# Patient Record
Sex: Female | Born: 1935 | Race: White | Hispanic: No | Smoking: Never smoker
Health system: Southern US, Community
[De-identification: ages and names within clinical notes are randomized; demographics above are authoritative.]

## PROBLEM LIST (undated history)

## (undated) DIAGNOSIS — R0602 Shortness of breath: Secondary | ICD-10-CM

## (undated) DIAGNOSIS — I160 Hypertensive urgency: Secondary | ICD-10-CM

## (undated) DIAGNOSIS — R0789 Other chest pain: Secondary | ICD-10-CM

## (undated) DIAGNOSIS — R002 Palpitations: Secondary | ICD-10-CM

## (undated) DIAGNOSIS — F419 Anxiety disorder, unspecified: Secondary | ICD-10-CM

## (undated) DIAGNOSIS — I714 Abdominal aortic aneurysm, without rupture, unspecified: Secondary | ICD-10-CM

## (undated) DIAGNOSIS — K449 Diaphragmatic hernia without obstruction or gangrene: Secondary | ICD-10-CM

## (undated) DIAGNOSIS — K219 Gastro-esophageal reflux disease without esophagitis: Secondary | ICD-10-CM

## (undated) DIAGNOSIS — I739 Peripheral vascular disease, unspecified: Secondary | ICD-10-CM

## (undated) DIAGNOSIS — I10 Essential (primary) hypertension: Secondary | ICD-10-CM

## (undated) DIAGNOSIS — N289 Disorder of kidney and ureter, unspecified: Secondary | ICD-10-CM

## (undated) DIAGNOSIS — E785 Hyperlipidemia, unspecified: Secondary | ICD-10-CM

## (undated) DIAGNOSIS — R55 Syncope and collapse: Secondary | ICD-10-CM

## (undated) DIAGNOSIS — I509 Heart failure, unspecified: Secondary | ICD-10-CM

## (undated) DIAGNOSIS — M1990 Unspecified osteoarthritis, unspecified site: Secondary | ICD-10-CM

## (undated) HISTORY — PX: BACK SURGERY: SHX140

## (undated) HISTORY — DX: Anxiety disorder, unspecified: F41.9

## (undated) HISTORY — PX: SPINE SURGERY: SHX786

## (undated) HISTORY — PX: APPENDECTOMY: SHX54

## (undated) HISTORY — DX: Gastro-esophageal reflux disease without esophagitis: K21.9

## (undated) HISTORY — PX: ADENOIDECTOMY: SHX5191

## (undated) HISTORY — PX: TONSILLECTOMY: SHX5217

## (undated) HISTORY — DX: Essential (primary) hypertension: I10

## (undated) HISTORY — DX: Palpitations: R00.2

## (undated) HISTORY — PX: CHOLECYSTECTOMY: SHX55

## (undated) HISTORY — DX: Abdominal aortic aneurysm, without rupture: I71.4

## (undated) HISTORY — DX: Hypertensive urgency: I16.0

## (undated) HISTORY — DX: Shortness of breath: R06.02

## (undated) HISTORY — DX: Diaphragmatic hernia without obstruction or gangrene: K44.9

## (undated) HISTORY — DX: Disorder of kidney and ureter, unspecified: N28.9

## (undated) HISTORY — DX: Abdominal aortic aneurysm, without rupture, unspecified: I71.40

## (undated) HISTORY — PX: BLADDER SUSPENSION: SHX72

## (undated) HISTORY — PX: TOTAL HIP ARTHROPLASTY: SHX124

## (undated) HISTORY — DX: Syncope and collapse: R55

## (undated) HISTORY — PX: COLONOSCOPY W/ BIOPSIES AND POLYPECTOMY: SHX1376

## (undated) HISTORY — PX: ABDOMINAL HYSTERECTOMY: SHX81

## (undated) HISTORY — PX: TONSILLECTOMY: SUR1361

## (undated) HISTORY — DX: Heart failure, unspecified: I50.9

## (undated) HISTORY — DX: Hyperlipidemia, unspecified: E78.5

## (undated) HISTORY — PX: EYE SURGERY: SHX253

## (undated) HISTORY — DX: Unspecified osteoarthritis, unspecified site: M19.90

## (undated) HISTORY — DX: Other chest pain: R07.89

## (undated) HISTORY — DX: Peripheral vascular disease, unspecified: I73.9

---

## 1997-10-10 ENCOUNTER — Encounter: Admission: RE | Admit: 1997-10-10 | Discharge: 1998-01-08 | Payer: Self-pay | Admitting: Orthopedic Surgery

## 1998-04-07 ENCOUNTER — Inpatient Hospital Stay (HOSPITAL_COMMUNITY): Admission: EM | Admit: 1998-04-07 | Discharge: 1998-04-08 | Payer: Self-pay | Admitting: *Deleted

## 1998-04-07 ENCOUNTER — Encounter: Payer: Self-pay | Admitting: *Deleted

## 1998-10-01 ENCOUNTER — Encounter: Payer: Self-pay | Admitting: Neurosurgery

## 1998-10-01 ENCOUNTER — Ambulatory Visit (HOSPITAL_COMMUNITY): Admission: RE | Admit: 1998-10-01 | Discharge: 1998-10-01 | Payer: Self-pay | Admitting: Neurosurgery

## 1998-10-23 ENCOUNTER — Ambulatory Visit (HOSPITAL_COMMUNITY): Admission: RE | Admit: 1998-10-23 | Discharge: 1998-10-23 | Payer: Self-pay | Admitting: Neurosurgery

## 1998-10-23 ENCOUNTER — Encounter: Payer: Self-pay | Admitting: Neurosurgery

## 1998-11-06 ENCOUNTER — Encounter: Payer: Self-pay | Admitting: Neurosurgery

## 1998-11-06 ENCOUNTER — Ambulatory Visit (HOSPITAL_COMMUNITY): Admission: RE | Admit: 1998-11-06 | Discharge: 1998-11-06 | Payer: Self-pay | Admitting: Neurosurgery

## 1998-11-21 ENCOUNTER — Ambulatory Visit (HOSPITAL_COMMUNITY): Admission: RE | Admit: 1998-11-21 | Discharge: 1998-11-21 | Payer: Self-pay | Admitting: Neurosurgery

## 1998-11-21 ENCOUNTER — Encounter: Payer: Self-pay | Admitting: Neurosurgery

## 1999-03-02 ENCOUNTER — Ambulatory Visit (HOSPITAL_COMMUNITY): Admission: RE | Admit: 1999-03-02 | Discharge: 1999-03-02 | Payer: Self-pay | Admitting: Neurosurgery

## 1999-03-02 ENCOUNTER — Encounter: Payer: Self-pay | Admitting: Neurosurgery

## 1999-06-11 ENCOUNTER — Encounter: Payer: Self-pay | Admitting: Internal Medicine

## 1999-06-11 ENCOUNTER — Encounter: Admission: RE | Admit: 1999-06-11 | Discharge: 1999-06-11 | Payer: Self-pay | Admitting: Internal Medicine

## 1999-06-26 ENCOUNTER — Ambulatory Visit (HOSPITAL_COMMUNITY): Admission: RE | Admit: 1999-06-26 | Discharge: 1999-06-26 | Payer: Self-pay | Admitting: Neurosurgery

## 1999-06-26 ENCOUNTER — Encounter: Payer: Self-pay | Admitting: Neurosurgery

## 1999-07-16 ENCOUNTER — Encounter: Payer: Self-pay | Admitting: Neurosurgery

## 1999-07-18 ENCOUNTER — Inpatient Hospital Stay (HOSPITAL_COMMUNITY): Admission: RE | Admit: 1999-07-18 | Discharge: 1999-07-23 | Payer: Self-pay | Admitting: Neurosurgery

## 1999-07-18 ENCOUNTER — Encounter: Payer: Self-pay | Admitting: Neurosurgery

## 1999-07-19 ENCOUNTER — Encounter: Payer: Self-pay | Admitting: Neurosurgery

## 1999-07-22 ENCOUNTER — Encounter: Payer: Self-pay | Admitting: Neurosurgery

## 1999-09-15 ENCOUNTER — Encounter: Admission: RE | Admit: 1999-09-15 | Discharge: 1999-09-15 | Payer: Self-pay | Admitting: Neurosurgery

## 1999-09-15 ENCOUNTER — Encounter: Payer: Self-pay | Admitting: Neurosurgery

## 1999-11-11 ENCOUNTER — Encounter: Admission: RE | Admit: 1999-11-11 | Discharge: 1999-11-11 | Payer: Self-pay | Admitting: Neurosurgery

## 1999-11-11 ENCOUNTER — Encounter: Payer: Self-pay | Admitting: Neurosurgery

## 1999-11-23 ENCOUNTER — Encounter: Payer: Self-pay | Admitting: Neurosurgery

## 1999-11-23 ENCOUNTER — Ambulatory Visit (HOSPITAL_COMMUNITY): Admission: RE | Admit: 1999-11-23 | Discharge: 1999-11-23 | Payer: Self-pay | Admitting: Neurosurgery

## 2000-01-17 ENCOUNTER — Emergency Department (HOSPITAL_COMMUNITY): Admission: EM | Admit: 2000-01-17 | Discharge: 2000-01-17 | Payer: Self-pay | Admitting: Emergency Medicine

## 2000-01-17 ENCOUNTER — Encounter: Payer: Self-pay | Admitting: Emergency Medicine

## 2000-06-04 ENCOUNTER — Inpatient Hospital Stay (HOSPITAL_COMMUNITY): Admission: EM | Admit: 2000-06-04 | Discharge: 2000-06-08 | Payer: Self-pay | Admitting: Emergency Medicine

## 2000-06-04 ENCOUNTER — Encounter: Payer: Self-pay | Admitting: Emergency Medicine

## 2000-06-21 ENCOUNTER — Encounter: Payer: Self-pay | Admitting: Internal Medicine

## 2000-06-21 ENCOUNTER — Encounter: Admission: RE | Admit: 2000-06-21 | Discharge: 2000-06-21 | Payer: Self-pay | Admitting: Internal Medicine

## 2000-11-23 ENCOUNTER — Other Ambulatory Visit: Admission: RE | Admit: 2000-11-23 | Discharge: 2000-11-23 | Payer: Self-pay | Admitting: Obstetrics and Gynecology

## 2001-03-23 ENCOUNTER — Ambulatory Visit (HOSPITAL_COMMUNITY): Admission: RE | Admit: 2001-03-23 | Discharge: 2001-03-23 | Payer: Self-pay | Admitting: Neurosurgery

## 2001-03-23 ENCOUNTER — Encounter: Payer: Self-pay | Admitting: Neurosurgery

## 2001-06-13 ENCOUNTER — Encounter: Payer: Self-pay | Admitting: General Surgery

## 2001-06-13 ENCOUNTER — Encounter: Admission: RE | Admit: 2001-06-13 | Discharge: 2001-06-13 | Payer: Self-pay | Admitting: General Surgery

## 2001-10-05 ENCOUNTER — Encounter: Admission: RE | Admit: 2001-10-05 | Discharge: 2001-10-05 | Payer: Self-pay | Admitting: Neurosurgery

## 2001-10-05 ENCOUNTER — Encounter: Payer: Self-pay | Admitting: Neurosurgery

## 2001-10-25 ENCOUNTER — Encounter: Payer: Self-pay | Admitting: Neurosurgery

## 2001-10-25 ENCOUNTER — Encounter: Admission: RE | Admit: 2001-10-25 | Discharge: 2001-10-25 | Payer: Self-pay | Admitting: Neurosurgery

## 2001-11-08 ENCOUNTER — Encounter: Admission: RE | Admit: 2001-11-08 | Discharge: 2001-11-08 | Payer: Self-pay | Admitting: Neurosurgery

## 2001-11-08 ENCOUNTER — Encounter: Payer: Self-pay | Admitting: Neurosurgery

## 2002-04-28 ENCOUNTER — Ambulatory Visit (HOSPITAL_COMMUNITY): Admission: RE | Admit: 2002-04-28 | Discharge: 2002-04-28 | Payer: Self-pay | Admitting: Internal Medicine

## 2002-07-16 ENCOUNTER — Ambulatory Visit (HOSPITAL_COMMUNITY): Admission: RE | Admit: 2002-07-16 | Discharge: 2002-07-16 | Payer: Self-pay | Admitting: Neurosurgery

## 2002-07-16 ENCOUNTER — Encounter: Payer: Self-pay | Admitting: Neurosurgery

## 2002-08-09 ENCOUNTER — Encounter: Payer: Self-pay | Admitting: Neurosurgery

## 2002-08-11 ENCOUNTER — Inpatient Hospital Stay (HOSPITAL_COMMUNITY): Admission: RE | Admit: 2002-08-11 | Discharge: 2002-08-13 | Payer: Self-pay | Admitting: Neurosurgery

## 2002-08-11 ENCOUNTER — Encounter: Payer: Self-pay | Admitting: Neurosurgery

## 2002-09-29 ENCOUNTER — Encounter: Payer: Self-pay | Admitting: Internal Medicine

## 2002-09-29 ENCOUNTER — Encounter: Admission: RE | Admit: 2002-09-29 | Discharge: 2002-09-29 | Payer: Self-pay | Admitting: Internal Medicine

## 2003-01-12 ENCOUNTER — Ambulatory Visit (HOSPITAL_COMMUNITY): Admission: RE | Admit: 2003-01-12 | Discharge: 2003-01-12 | Payer: Self-pay | Admitting: Neurosurgery

## 2003-01-12 ENCOUNTER — Encounter: Payer: Self-pay | Admitting: Neurosurgery

## 2003-03-20 ENCOUNTER — Encounter: Admission: RE | Admit: 2003-03-20 | Discharge: 2003-03-20 | Payer: Self-pay | Admitting: Orthopedic Surgery

## 2003-03-21 ENCOUNTER — Ambulatory Visit (HOSPITAL_BASED_OUTPATIENT_CLINIC_OR_DEPARTMENT_OTHER): Admission: RE | Admit: 2003-03-21 | Discharge: 2003-03-21 | Payer: Self-pay | Admitting: Orthopedic Surgery

## 2003-03-21 ENCOUNTER — Ambulatory Visit (HOSPITAL_COMMUNITY): Admission: RE | Admit: 2003-03-21 | Discharge: 2003-03-21 | Payer: Self-pay | Admitting: Orthopedic Surgery

## 2003-06-29 ENCOUNTER — Ambulatory Visit (HOSPITAL_COMMUNITY): Admission: RE | Admit: 2003-06-29 | Discharge: 2003-06-29 | Payer: Self-pay | Admitting: Orthopedic Surgery

## 2003-06-29 ENCOUNTER — Ambulatory Visit (HOSPITAL_BASED_OUTPATIENT_CLINIC_OR_DEPARTMENT_OTHER): Admission: RE | Admit: 2003-06-29 | Discharge: 2003-06-29 | Payer: Self-pay | Admitting: Orthopedic Surgery

## 2003-09-17 ENCOUNTER — Ambulatory Visit (HOSPITAL_COMMUNITY): Admission: RE | Admit: 2003-09-17 | Discharge: 2003-09-17 | Payer: Self-pay | Admitting: Neurosurgery

## 2003-12-14 ENCOUNTER — Ambulatory Visit (HOSPITAL_BASED_OUTPATIENT_CLINIC_OR_DEPARTMENT_OTHER): Admission: RE | Admit: 2003-12-14 | Discharge: 2003-12-14 | Payer: Self-pay | Admitting: Orthopedic Surgery

## 2003-12-14 ENCOUNTER — Ambulatory Visit (HOSPITAL_COMMUNITY): Admission: RE | Admit: 2003-12-14 | Discharge: 2003-12-14 | Payer: Self-pay | Admitting: Orthopedic Surgery

## 2004-08-11 ENCOUNTER — Encounter: Admission: RE | Admit: 2004-08-11 | Discharge: 2004-08-11 | Payer: Self-pay | Admitting: Internal Medicine

## 2004-08-14 ENCOUNTER — Ambulatory Visit: Payer: Self-pay | Admitting: Internal Medicine

## 2004-08-19 ENCOUNTER — Ambulatory Visit (HOSPITAL_COMMUNITY): Admission: RE | Admit: 2004-08-19 | Discharge: 2004-08-19 | Payer: Self-pay | Admitting: Internal Medicine

## 2004-08-19 ENCOUNTER — Ambulatory Visit: Payer: Self-pay | Admitting: Internal Medicine

## 2004-09-21 ENCOUNTER — Ambulatory Visit (HOSPITAL_COMMUNITY): Admission: RE | Admit: 2004-09-21 | Discharge: 2004-09-21 | Payer: Self-pay | Admitting: Neurosurgery

## 2004-11-10 ENCOUNTER — Encounter: Admission: RE | Admit: 2004-11-10 | Discharge: 2004-11-10 | Payer: Self-pay | Admitting: Neurosurgery

## 2004-12-01 ENCOUNTER — Encounter: Admission: RE | Admit: 2004-12-01 | Discharge: 2004-12-01 | Payer: Self-pay | Admitting: Neurosurgery

## 2004-12-15 ENCOUNTER — Encounter: Admission: RE | Admit: 2004-12-15 | Discharge: 2004-12-15 | Payer: Self-pay | Admitting: Neurosurgery

## 2005-01-07 ENCOUNTER — Encounter: Admission: RE | Admit: 2005-01-07 | Discharge: 2005-01-07 | Payer: Self-pay | Admitting: Internal Medicine

## 2005-04-19 ENCOUNTER — Emergency Department (HOSPITAL_COMMUNITY): Admission: EM | Admit: 2005-04-19 | Discharge: 2005-04-19 | Payer: Self-pay | Admitting: Emergency Medicine

## 2005-04-20 ENCOUNTER — Ambulatory Visit (HOSPITAL_COMMUNITY): Admission: RE | Admit: 2005-04-20 | Discharge: 2005-04-20 | Payer: Self-pay | Admitting: Orthopedic Surgery

## 2005-07-21 ENCOUNTER — Emergency Department (HOSPITAL_COMMUNITY): Admission: EM | Admit: 2005-07-21 | Discharge: 2005-07-21 | Payer: Self-pay | Admitting: Emergency Medicine

## 2005-07-24 ENCOUNTER — Inpatient Hospital Stay (HOSPITAL_COMMUNITY): Admission: EM | Admit: 2005-07-24 | Discharge: 2005-07-26 | Payer: Self-pay | Admitting: Internal Medicine

## 2005-12-07 ENCOUNTER — Inpatient Hospital Stay (HOSPITAL_COMMUNITY): Admission: EM | Admit: 2005-12-07 | Discharge: 2005-12-09 | Payer: Self-pay | Admitting: Emergency Medicine

## 2006-01-09 HISTORY — PX: CARDIAC CATHETERIZATION: SHX172

## 2007-04-19 ENCOUNTER — Ambulatory Visit: Payer: Self-pay

## 2007-07-27 ENCOUNTER — Emergency Department (HOSPITAL_COMMUNITY): Admission: EM | Admit: 2007-07-27 | Discharge: 2007-07-27 | Payer: Self-pay | Admitting: Emergency Medicine

## 2007-12-13 ENCOUNTER — Ambulatory Visit: Payer: Self-pay | Admitting: Ophthalmology

## 2007-12-13 ENCOUNTER — Other Ambulatory Visit: Payer: Self-pay

## 2007-12-20 ENCOUNTER — Ambulatory Visit: Payer: Self-pay | Admitting: Ophthalmology

## 2008-01-10 ENCOUNTER — Ambulatory Visit: Payer: Self-pay | Admitting: *Deleted

## 2008-01-30 ENCOUNTER — Ambulatory Visit: Payer: Self-pay | Admitting: Ophthalmology

## 2008-02-18 ENCOUNTER — Emergency Department: Payer: Self-pay | Admitting: Emergency Medicine

## 2008-02-21 ENCOUNTER — Emergency Department (HOSPITAL_COMMUNITY): Admission: EM | Admit: 2008-02-21 | Discharge: 2008-02-21 | Payer: Self-pay | Admitting: Emergency Medicine

## 2008-04-19 HISTORY — PX: CARDIOVASCULAR STRESS TEST: SHX262

## 2008-10-12 ENCOUNTER — Inpatient Hospital Stay: Payer: Self-pay | Admitting: Internal Medicine

## 2008-10-12 HISTORY — PX: US ECHOCARDIOGRAPHY: HXRAD669

## 2008-10-18 ENCOUNTER — Emergency Department (HOSPITAL_COMMUNITY): Admission: EM | Admit: 2008-10-18 | Discharge: 2008-10-18 | Payer: Self-pay | Admitting: Emergency Medicine

## 2008-10-19 ENCOUNTER — Ambulatory Visit: Payer: Self-pay | Admitting: Vascular Surgery

## 2008-10-21 ENCOUNTER — Observation Stay (HOSPITAL_COMMUNITY): Admission: EM | Admit: 2008-10-21 | Discharge: 2008-10-24 | Payer: Self-pay | Admitting: Emergency Medicine

## 2009-02-21 ENCOUNTER — Ambulatory Visit (HOSPITAL_COMMUNITY): Admission: RE | Admit: 2009-02-21 | Discharge: 2009-02-21 | Payer: Self-pay | Admitting: Orthopedic Surgery

## 2009-08-23 ENCOUNTER — Encounter (INDEPENDENT_AMBULATORY_CARE_PROVIDER_SITE_OTHER): Payer: Self-pay

## 2009-11-16 ENCOUNTER — Emergency Department (HOSPITAL_COMMUNITY): Admission: EM | Admit: 2009-11-16 | Discharge: 2009-11-16 | Payer: Self-pay | Admitting: Emergency Medicine

## 2010-06-06 ENCOUNTER — Ambulatory Visit: Payer: Self-pay | Admitting: Cardiology

## 2010-06-10 NOTE — Letter (Signed)
Summary: Recall, Screening Colonoscopy Only  [facility]  [address]   [place], [place] [address]   Phone: [phone]  Fax: [phone]    August 23, 2009  SHAVONTA GOSSEN [address] [place], [place]  [address] 1927/11/07   Dear Ms. Soland,   Our records indicate it is time to schedule your colonoscopy.  However, our office has been unable to contact you by phone.   Please call our office at [phone] and ask for the nurse.   Thank you,    Hendricks Limes, LPN Cloria Spring, LPN  [facility] Ph: [phone]   Fax: [phone]

## 2010-06-12 ENCOUNTER — Telehealth (INDEPENDENT_AMBULATORY_CARE_PROVIDER_SITE_OTHER): Payer: Self-pay | Admitting: *Deleted

## 2010-06-13 ENCOUNTER — Ambulatory Visit (HOSPITAL_COMMUNITY): Payer: Medicare Other | Attending: Cardiology

## 2010-06-13 DIAGNOSIS — I079 Rheumatic tricuspid valve disease, unspecified: Secondary | ICD-10-CM | POA: Insufficient documentation

## 2010-06-13 DIAGNOSIS — I379 Nonrheumatic pulmonary valve disorder, unspecified: Secondary | ICD-10-CM | POA: Insufficient documentation

## 2010-06-13 DIAGNOSIS — I359 Nonrheumatic aortic valve disorder, unspecified: Secondary | ICD-10-CM

## 2010-06-13 DIAGNOSIS — I080 Rheumatic disorders of both mitral and aortic valves: Secondary | ICD-10-CM | POA: Insufficient documentation

## 2010-06-18 NOTE — Progress Notes (Signed)
Summary: Echo appt  Phone Note Outgoing Call Call back at [facility] Phone [phone]   Call placed by: Stanton Kidney, EMT-P,  June 12, 2010 3:02 PM Action Taken: Phone Call Completed Summary of Call: Patient aware to arrive 30 min prior to Echo appt. Stanton Kidney, EMT-P  June 12, 2010 3:02 PM

## 2010-07-15 ENCOUNTER — Emergency Department: Payer: Self-pay | Admitting: Emergency Medicine

## 2010-07-27 LAB — URINALYSIS, ROUTINE W REFLEX MICROSCOPIC
Hgb urine dipstick: NEGATIVE
Ketones, ur: NEGATIVE mg/dL
Nitrite: NEGATIVE
Protein, ur: NEGATIVE mg/dL
pH: 6.5 (ref 5.0–8.0)

## 2010-07-27 LAB — TROPONIN I: Troponin I: 0.01 ng/mL (ref 0.00–0.06)

## 2010-07-27 LAB — CK TOTAL AND CKMB (NOT AT ARMC)
CK, MB: 2.3 ng/mL (ref 0.3–4.0)
Relative Index: INVALID (ref 0.0–2.5)
Total CK: 79 U/L (ref 7–177)

## 2010-07-27 LAB — DIFFERENTIAL
Eosinophils Absolute: 0.2 10*3/uL (ref 0.0–0.7)
Eosinophils Relative: 3 % (ref 0–5)
Lymphs Abs: 1.6 10*3/uL (ref 0.7–4.0)
Monocytes Relative: 8 % (ref 3–12)

## 2010-07-27 LAB — POCT CARDIAC MARKERS: Troponin i, poc: <0.05 ng/mL (ref 0.00–0.09)

## 2010-07-27 LAB — URINE MICROSCOPIC-ADD ON

## 2010-07-27 LAB — BASIC METABOLIC PANEL
BUN: 24 mg/dL — ABNORMAL HIGH (ref 6–23)
Calcium: 9.2 mg/dL (ref 8.4–10.5)
Chloride: 110 mEq/L (ref 96–112)
Creatinine, Ser: 1.19 mg/dL (ref 0.4–1.2)
GFR calc non Af Amer: 44 mL/min — ABNORMAL LOW (ref >60)
Potassium: 4.2 mEq/L (ref 3.5–5.1)

## 2010-08-14 ENCOUNTER — Other Ambulatory Visit: Payer: Self-pay | Admitting: Neurosurgery

## 2010-08-14 DIAGNOSIS — M479 Spondylosis, unspecified: Secondary | ICD-10-CM

## 2010-08-16 ENCOUNTER — Ambulatory Visit
Admission: RE | Admit: 2010-08-16 | Discharge: 2010-08-16 | Disposition: A | Discharge status: Home / Self Care | Payer: Medicare Other | Source: Ambulatory Visit | Attending: Neurosurgery | Admitting: Neurosurgery

## 2010-08-16 DIAGNOSIS — M479 Spondylosis, unspecified: Secondary | ICD-10-CM

## 2010-08-18 LAB — APTT: aPTT: 28 seconds (ref 24–37)

## 2010-08-18 LAB — URINALYSIS, ROUTINE W REFLEX MICROSCOPIC
Glucose, UA: NEGATIVE mg/dL
Glucose, UA: NEGATIVE mg/dL
Hgb urine dipstick: NEGATIVE
Hgb urine dipstick: NEGATIVE
Protein, ur: NEGATIVE mg/dL
Specific Gravity, Urine: 1.007 (ref 1.005–1.030)
Specific Gravity, Urine: 1.014 (ref 1.005–1.030)
Urobilinogen, UA: 1 mg/dL (ref 0.0–1.0)
pH: 6 (ref 5.0–8.0)

## 2010-08-18 LAB — DIFFERENTIAL
Basophils Absolute: 0 10*3/uL (ref 0.0–0.1)
Basophils Relative: 0 % (ref 0–1)
Eosinophils Absolute: 0.2 10*3/uL (ref 0.0–0.7)
Eosinophils Absolute: 0.2 10*3/uL (ref 0.0–0.7)
Lymphocytes Relative: 31 % (ref 12–46)
Lymphs Abs: 1.8 10*3/uL (ref 0.7–4.0)
Monocytes Relative: 14 % — ABNORMAL HIGH (ref 3–12)
Monocytes Relative: 17 % — ABNORMAL HIGH (ref 3–12)
Neutro Abs: 3.3 10*3/uL (ref 1.7–7.7)
Neutrophils Relative %: 46 % (ref 43–77)
Neutrophils Relative %: 49 % (ref 43–77)

## 2010-08-18 LAB — CBC
HCT: 36.2 % (ref 36.0–46.0)
HCT: 39.5 % (ref 36.0–46.0)
Hemoglobin: 12.1 g/dL (ref 12.0–15.0)
Hemoglobin: 12.9 g/dL (ref 12.0–15.0)
Hemoglobin: 13.1 g/dL (ref 12.0–15.0)
MCHC: 33.5 g/dL (ref 30.0–36.0)
MCV: 92.7 fL (ref 78.0–100.0)
MCV: 93.2 fL (ref 78.0–100.0)
Platelets: 274 10*3/uL (ref 150–400)
Platelets: 315 10*3/uL (ref 150–400)
RBC: 3.85 MIL/uL — ABNORMAL LOW (ref 3.87–5.11)
RBC: 4.02 MIL/uL (ref 3.87–5.11)
RBC: 4.15 MIL/uL (ref 3.87–5.11)
RBC: 4.29 MIL/uL (ref 3.87–5.11)
RBC: 4.36 MIL/uL (ref 3.87–5.11)
RDW: 13.8 % (ref 11.5–15.5)
RDW: 14.2 % (ref 11.5–15.5)
WBC: 6.3 10*3/uL (ref 4.0–10.5)
WBC: 5.9 10*3/uL (ref 4.0–10.5)
WBC: 6 10*3/uL (ref 4.0–10.5)
WBC: 7.1 10*3/uL (ref 4.0–10.5)

## 2010-08-18 LAB — METANEPHRINES, URINE, 24 HOUR: Normetanephrine, 24H Ur: 397 mcg/24 h (ref 122–676)

## 2010-08-18 LAB — COMPREHENSIVE METABOLIC PANEL
ALT: 17 U/L (ref 0–35)
ALT: 18 U/L (ref 0–35)
ALT: 20 U/L (ref 0–35)
AST: 22 U/L (ref 0–37)
Albumin: 3 g/dL — ABNORMAL LOW (ref 3.5–5.2)
Alkaline Phosphatase: 46 U/L (ref 39–117)
BUN: 20 mg/dL (ref 6–23)
CO2: 26 mEq/L (ref 19–32)
CO2: 27 mEq/L (ref 19–32)
Calcium: 9.4 mg/dL (ref 8.4–10.5)
Chloride: 107 mEq/L (ref 96–112)
Chloride: 108 mEq/L (ref 96–112)
Creatinine, Ser: 1.38 mg/dL — ABNORMAL HIGH (ref 0.4–1.2)
Creatinine, Ser: 1.4 mg/dL — ABNORMAL HIGH (ref 0.4–1.2)
GFR calc Af Amer: 44 mL/min — ABNORMAL LOW (ref >60)
GFR calc Af Amer: 45 mL/min — ABNORMAL LOW (ref >60)
GFR calc non Af Amer: 36 mL/min — ABNORMAL LOW (ref >60)
GFR calc non Af Amer: 37 mL/min — ABNORMAL LOW (ref >60)
Glucose, Bld: 104 mg/dL — ABNORMAL HIGH (ref 70–99)
Glucose, Bld: 94 mg/dL (ref 70–99)
Potassium: 3.6 mEq/L (ref 3.5–5.1)
Sodium: 139 mEq/L (ref 135–145)
Sodium: 140 mEq/L (ref 135–145)
Sodium: 144 mEq/L (ref 135–145)
Total Bilirubin: 0.6 mg/dL (ref 0.3–1.2)
Total Bilirubin: 0.6 mg/dL (ref 0.3–1.2)
Total Protein: 6.2 g/dL (ref 6.0–8.3)

## 2010-08-18 LAB — POCT CARDIAC MARKERS: Troponin i, poc: <0.05 ng/mL (ref 0.00–0.09)

## 2010-08-18 LAB — CK TOTAL AND CKMB (NOT AT ARMC)
CK, MB: 0.8 ng/mL (ref 0.3–4.0)
CK, MB: 0.9 ng/mL (ref 0.3–4.0)
CK, MB: 1 ng/mL (ref 0.3–4.0)
Relative Index: INVALID (ref 0.0–2.5)
Relative Index: INVALID (ref 0.0–2.5)
Relative Index: INVALID (ref 0.0–2.5)
Total CK: 48 U/L (ref 7–177)
Total CK: 52 U/L (ref 7–177)

## 2010-08-18 LAB — BRAIN NATRIURETIC PEPTIDE: Pro B Natriuretic peptide (BNP): 66 pg/mL (ref 0.0–100.0)

## 2010-08-18 LAB — VMA + CREATININE, URINE (TIMED COLLECTION)
VMA, 24H Ur Adult: 5.5 mg/24hr (ref 1.8–6.7)
Volume, Urine-VMAUR: 1150

## 2010-08-18 LAB — URINE CULTURE: Colony Count: NO GROWTH

## 2010-08-18 LAB — CATECHOLAMINES, FRACTIONATED, PLASMA
Dopamine: 62 pg/mL — ABNORMAL HIGH (ref <60)
Total Catecholamines (Nor+Epi): 972 pg/mL — ABNORMAL HIGH (ref <504)

## 2010-08-18 LAB — URINE MICROSCOPIC-ADD ON

## 2010-08-18 LAB — TROPONIN I
Troponin I: 0.01 ng/mL (ref 0.00–0.06)
Troponin I: 0.02 ng/mL (ref 0.00–0.06)
Troponin I: 0.03 ng/mL (ref 0.00–0.06)

## 2010-08-18 LAB — PROTIME-INR: INR: 0.9 (ref 0.00–1.49)

## 2010-08-18 LAB — MICROALBUMIN, URINE, 24 HOUR: Urine Total Volume-MALBU: 1150 mL

## 2010-08-18 LAB — D-DIMER, QUANTITATIVE: D-Dimer, Quant: 0.74 ug/mL-FEU — ABNORMAL HIGH (ref 0.00–0.48)

## 2010-09-09 ENCOUNTER — Ambulatory Visit: Payer: Self-pay | Admitting: Physical Medicine and Rehabilitation

## 2010-09-23 NOTE — H&P (Signed)
NAME:  Amy Bolton, Amy Bolton                ACCOUNT NO.:  [id number]   MEDICAL RECORD NO.:  [id number]          PATIENT TYPE:  INP   LOCATION:  3731                         FACILITY:  [facility]   PHYSICIAN:  Barry Dienes. Eloise Harman, M.D.DATE OF BIRTH:  1927-12-29   DATE OF ADMISSION:  10/21/2008  DATE OF DISCHARGE:                              HISTORY & PHYSICAL   CHIEF COMPLAINT:  Chest pain and palpitations.   HISTORY OF PRESENT ILLNESS:  The patient is a 77 year old white female,  who was recently noted to have markedly increased blood pressure, which  resulted in a 4-day admission to the [facility].  During  that stay, an urinary tract infection was diagnosed and treated and an  echocardiogram was done with results not currently available.  Benicar  was also changed to Toprol-XL 100 mg p.o. b.i.d.  On October 18, 2008, she  presented with vague substernal chest pain and palpitations to the  emergency room.  At that time, an EKG, chest x-ray, and labs were all  unremarkable with the exception of a D-dimer level of 0.74.  A dose of  Lovenox was given and she presented to our office the next day for  followup.  On October 19, 2008, she had bilateral DVT ultrasound exam done  that was normal, so she was not continued on anticoagulation therapy.  A  CT angiogram was not done due to borderline serum creatinine levels.  Today, starting at around 2:00 p.m., she had palpitations associated  with vague chest pressure and moderate dyspnea on exertion.  This lasted  several hours and resolved by the time of emergency room presentation.  Currently, she feels that are baseline.  She notes over the past 2  weeks, she has had a great deal of fatigue.  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   PAST MEDICAL HISTORY:  Hypertension, asthma, bronchitis, depression,  gastroesophageal reflux disease, and hiatal hernia, hyperlipidemia, and  osteoarthritis.  In 2006 colonoscopy showing polyps and diverticuli and  in September 2007 cardiac catheterization showing minimal proximal left  anterior descending artery disease.   MEDICATIONS:  1. Advair 100/50 one inhalation twice daily.  2. Hydralazine 50 mg p.o. b.i.d.  3. Lofibra 160 mg p.o. daily.  4. Toprol-XL 100 mg p.o. b.i.d.  5. Xanax 0.25 mg p.o. nightly.  6. Fish oil 1 g daily.  7. Remeron 30 mg p.o. nightly.   ALLERGIES:  PENICILLIN has been associated with C. difficile colitis.  She recalls no history of allergy to Norvasc.   PAST SURGICAL HISTORY:  Tonsillectomy and adenoidectomy, total abdominal  hysterectomy, cholecystectomy, in 2001 lumbar spine surgery at the L4-5  level; in 2004, C-spine operation with fusion at the C5-6 level; in  2004, left thumb CMC joint operation; in 2005 bilateral rotator cuff  surgeries, and bladder suspension surgery; in September 2007, cardiac  catheterization showing less than 50% stenosis of the proximal LAD and  otherwise normal coronary arteries.  At that time, a CT angiogram a  chest was normal.   FAMILY  HISTORY:  COPD, Alzheimer disease, colon cancer in a sister and  diabetes mellitus, type 2 in 2 sisters.   SOCIAL HISTORY:  She has been a widow since September 2007.  She is  retired.  She has 1 son and 2 daughters.  A daughter lives approximately  4 miles away from her home.  She has no history of tobacco abuse or  alcohol abuse and lives by herself.   REVIEW OF SYSTEMS:  She has intermittent wheezing.  She also had  intermittent dyspnea on exertion of class 3/4 and intermittent  palpitations and vague substernal chest pain.  She has mild bilateral  knee pain.  She has not had recent fever, chills, low back pain,  anxiety, or depression.   INITIAL PHYSICAL EXAMINATION:  VITAL SIGNS:  Blood pressure 164/65,  pulse 71, respirations 20, temperature 97.8, pulse oxygen saturation was  97% on room air.  GENERAL:  She is an  overweight white female who is in  no apparent distress, while lying partially upright in bed.  HEAD, EYES, EARS, NOSE and THROAT:  Within normal limits.  NECK:  Supple without jugular venous distention or carotid bruit.  CHEST:  Clear to auscultation.  HEART:  Regular rate and rhythm with a systolic ejection murmur, grade  2/6 at the left sternal border suggestive of aortic stenosis.  ABDOMEN:  Normal bowel sounds and no hepatosplenomegaly or renal bruit.  EXTREMITIES:  Without cyanosis, clubbing, or edema and the pedal pulses  were normal bilaterally.  NEUROLOGIC:  She was alert and well oriented with normal affect.  She  had a nonfocal neurologic exam.   LABORATORY STUDIES:  PTT 28, PT 12.6, white blood cell count 7.1,  hemoglobin 13, hematocrit 39, and platelets 3-8.  Troponin I level was  less than 0.05 and 0.03, CK-MB was less than 1.0.  On October 18, 2008,  serum sodium was 139 with potassium 3.9, chloride 108, carbon dioxide  24, BUN 24, creatinine 1.40, and glucose 104.  Total protein is 6.2 and  albumin is 3.6.   EKG showed the following:  1. Normal sinus rhythm.  2. Early R/S transition in the precordial leads.   A chest x-ray showed mild hyperexpansion of the lungs with no acute  cardiopulmonary disease.   IMPRESSION AND PLAN:  1. Chest pain and palpitations:  Her symptoms are intermittent and      suggestive of intermittent atrial fibrillation.  I doubt that she      has new angina.  Gastroesophageal reflux disease causing substernal      chest discomfort is possible, but would not likely causes shortness      of breath.  I plan to check serial cardiac isoenzymes and keep her      on a telemetry bed.  We will have her cardiologist reevaluate her      in the a.m.  We will try to obtain the official echocardiogram      report from her recent [facility] stay.  She will      be placed on      empiric Protonix 40 mg daily as her symptoms could be due to       reflux.  2. Fatigue:  Most likely due to high-dose Toprol-XL treatment.  I      would defer a decision on lowering her beta-blocker treatment and      restarting her ARB to her primary care physician.  ______________________________  Barry Dienes Eloise Harman, M.D.     DGP/MEDQ  D:  10/21/2008  T:  10/22/2008  Job:  [id number]   cc:   [name], MD  Colleen Can Deborah Chalk, M.D.  Harvie Junior, M.D.  Payton Doughty, M.D.

## 2010-09-23 NOTE — Procedures (Signed)
DUPLEX DEEP VENOUS EXAM - LOWER EXTREMITY   INDICATION:  Right lower extremity pain and tightness.   HISTORY:  Edema:  No.  Trauma/Surgery:  Yes.  Pain:  Yes.  PE:  No.  Previous DVT:  No.  Anticoagulants:  Yes.  Other:   DUPLEX EXAM:                CFV   SFV   PopV  PTV    GSV                R  L  R  L  R  L  R   L  R  L  Thrombosis    o  o  o     o     o      o  Spontaneous   +  +  +     +     +      +  Phasic        +  +  +     +     +      +  Augmentation  +  +  +     +     +      +  Compressible  +  +  +     +     +      +  Competent     +  +  +     +     +      +   Legend:  + - yes  o - no  p - partial  D - decreased   IMPRESSION:  No evidence of deep or superficial vein thrombosis in the  right lower extremity.    _____________________________  Quita Skye Hart [place], M.D.   AC/MEDQ  D:  10/19/2008  T:  10/19/2008  Job:  [id number]

## 2010-09-23 NOTE — Discharge Summary (Signed)
NAME:  Amy Bolton, Amy Bolton NO.:  [id number]   MEDICAL RECORD NO.:  [id number]          PATIENT TYPE:  OBV   LOCATION:  3738                         FACILITY:  [facility]   PHYSICIAN:  [name], MD       DATE OF BIRTH:  17-May-1927   DATE OF ADMISSION:  10/21/2008  DATE OF DISCHARGE:  10/24/2008                               DISCHARGE SUMMARY   PRIMARY CARE Abdalla Naramore:  [name], M.D.   CARDIOLOGIST:  Dr. Deborah Chalk and Dr. Reyes Ivan.   ORTHOPEDIC SURGEON:  Dr. Luiz Blare.   NEPHROLOGIST:  Dr. Lennox Pippins in [place], [place].   DISCHARGE DIAGNOSES:  1. Atypical chest pain.  2. Significant palpitations.  3. Episodic shortness of breath.  4. Episodic hypertensive urgency.  5. Hypertension.  6. Underlying asthma  7. Chronic kidney disease, stage 3, with creatinine clearance      estimated to be about 40.  8. Chronic anxiety.  9. Constipation.  10.Gastroesophageal reflux disease.  11.History of hiatal hernia.  12.Hyperlipidemia.  13.Osteoarthritis.  14.History of cardiac catheterization in September 2007, showing      minimal proximal left anterior descending artery disease.  15.Outpatient lower extremity Doppler, negative for deep vein      thrombosis on June 11.  16.Stress Cardiolite done April 19, 2008 showing ejection fraction      69% with otherwise a normal stress nuclear study.  17.2-D echo done at [facility] on October 12, 2008      showed some diastolic dysfunction, ejection fraction 55%, mild      tricuspid regurgitation, right ventricular systolic pressure      elevated a 40-50 mmHg, mild valvular aortic stenosis noted.   DISCHARGE PROCEDURES:  1. Include medical management.  2. Chest x-ray dated, October 21, 2008, no acute cardiopulmonary process.  3. VQ scan dated on October 22, 2008 shows normal ventilation-perfusion      lung scan.  No evidence suggestive of pulmonary emboli.  4. Cardiology consultation.   HISTORY OF  PRESENT ILLNESS:  Briefly, [name] is well-known to  me and has had a rough 2 or 3 weeks.  Of note, due to insurance reasons  and cost, her angiotensin receptor blocker was changed.  Immediately  after that change she was having worse and worse issues with her plantar  fasciitis and underwent injection, followed by oral steroids.  The  combination of this seemed to precipitate a hypertensive urgency event  for which she was admitted to [facility].  During that  time she had a urinary tract infection and acute renal failure on her  mild chronic renal insufficiency.  She was seen by nephrology.  Echocardiogram was done and medications were adjusted.  She followed up  with me as an outpatient and was doing well.  Blood pressure was okay.  The next day she had an acute episode of shortness of breath and  congestion, was sent to the emergency department where she was seen and  evaluated and was told she had an upper respiratory infection and sent  home.  At the June 10 visit, EKG, chest x-ray and labs were all  unremarkable except she did have a D-dimer that was elevated.  She was  given a dose of Lovenox and was sent home.  I saw her in the office the  following day and, as I stated before, she was still doing okay, but I  did send her for a lower extremity Doppler which was negative for DVT.  CT angiogram was not done due to her underlying creatinine elevation.  On October 21, 2008 around 2:00 p.m. she started developing palpitations,  associated with vague chest pressure and moderate dyspnea on exertion  and she called the doctor on call and was sent back to the emergency  department.  Blood pressure was 164/65, pulse 71, temperature was  normal, satting 97% on room air and physical exam was unremarkable  except for a 1-2/6 systolic ejection murmur noted.  White count was 7.1,  hemoglobin 13, platelet count was normal.  CK and troponins were normal.  BUN was 24,  creatinine was 1.4, glucose was 104, rest of labs were  normal.  EKG shows normal sinus rhythm.  Chest x-ray showed no acute  cardiopulmonary disease.  She was admitted for further evaluation and  treatment.   HOSPITAL COURSE:  I followed up with her the following morning on October 22, 2008.  Dr. Eloise Harman had put her on heparin due to the chest pain and  shortness of breath.  She ruled out for MI and, since she had episodic  shortness of breath, I sent her down for a VQ scan that was negative.  After VQ scan was negative and the labs remained normal, the heparin was  converted over to subcutaneous heparin for just DVT prophylaxis.  For  the chest pain, shortness of breath, palpitations, she remained on  telemetry monitoring and did not have any underlying arrhythmias, except  for sinus tachycardia, PVCs and PACs.  With my help and Dr. Silva Bandy  help, we titrated her medications to better effect.  It was felt that  she did not need any further cardiac workup.  Her creatinine remained  stable.  Due to the episodic nature of her symptoms, I felt pushing up  her beta blocker to slow the heart rate down, tight blood pressure  control was necessary, as did Dr. Reyes Ivan.  We ended up changing the  Toprol to Coreg to better beta blockade without pulmonary manifestations  and felt that, if we needed to get further slowing of the heart rate we  could always add digoxin.  For her atypical chest pain, she had a stress  test in the last 6 months and no further workup was felt necessary.  For  episodic blood pressure and heart rate adjustment, I sent a VMA,  metanephrines and catecholamines to rule out pheo.  I know this is a  stretch, but feel that, with her recent issues, this would be a  worthwhile endeavor.   For her episodic shortness of breath, a VQ scan was negative.  There was  no PE.  Her lower extremity Dopplers were negative.  There is no further  testing that needs to be done.  She is not  wheezing.  She is not having  any lung issues.  She is not having any underlying asthma symptoms.  She  will continue on her Advair and use albuterol on a very rare and p.r.n.  basis.   For hypertension, her blood pressure on date of discharge  on October 24, 2008 was 124/63, heart rate was 80.  Monitor showed 78-80 with PACs.   On October 24, 2008, she wants to go home.  She had partial  colon/constipation relief with a suppository that she had yesterday.  Her blood pressure is perfect.  She was having no more palpitations.  She was walking up in the halls without issue.  She is not having any  more chest pain or shortness of breath or tightness.  I will follow up  on the VMA, metanephrines and catecholamines as an outpatient and her  kidney function remained stable.  She said she  will go home and, if she continues to be constipated, she will take an  enema and do whatever else she needs to do.  Her last BUN and creatinine  were 21 and 1.38, respectively.  The patient will follow up with myself  and her nephrologist as an outpatient over the next couple of weeks and  will call earlier should I need to see her sooner.      [name], MD  Electronically Signed     JMR/MEDQ  D:  10/24/2008  T:  10/24/2008  Job:  [id number]   cc:   Colleen Can. Deborah Chalk, M.D.  Elmore Guise., M.D.  Harvie Junior, M.D.  [place], [place] Dr Rexene Edison Lizabeth Leyden

## 2010-09-26 NOTE — Discharge Summary (Signed)
[place]  Patient:    Amy Bolton, Amy Bolton                       MRN: [id number] Adm. Date:  [id number] Disc. Date: [id number] Attending:  Berry, Jonathan [place] Dictator:   Mancel Bale, P.A. CC:         Dr. Lorretta Harp, M.D.   Discharge Summary  ADMITTING DIAGNOSES:  1. Chest pain, questionable etiology (noncardiac).  2. Coronary artery disease; minimal coronary artery disease at     catheterization in 1999.  3. Hiatal hernia.  4. Gastroesophageal reflux disease.  5. Hypertension.  6. Status post lumbar surgery.  7. Status post cervical disk disease.  8. Hyperlipidemia.  9. History of rectocele.  DISCHARGE DIAGNOSES:  1. Chest pain, questionable etiology (noncardiac).  2. Coronary artery disease, minimal coronary artery disease at     catheterization in 1999.  3. Hiatal hernia.  4. Gastroesophageal reflux disease.  5. Hypertension.  6. Status post lumbar surgery.  7. Status post cervical disk disease.  8. Hyperlipidemia.  9. History of rectocele. 10. Status post cardiac catheterization June 07, 2000 revealing normal     left ventricular function and minimal coronary artery disease.  HISTORY OF PRESENT ILLNESS:   Amy Bolton is a 77 year old white female with a history of nonobstructive CAD by cath in 1999, which revealed a 20-30% ostial smooth LAD with normal left main, left circumflex and RCA.  She had done well until the morning of June 04, 2000.  At that time after having her permanent by her beautician she developed chest tightness, near syncope and nausea.  She laid down and took a nitroglycerin with relief.  After she got up the chest tightness returned and she had to take a second nitroglycerin with relief.  She then came to the ER.  In the ER she received Pepcid and GI cocktail, and was currently pain-free.  She does relate she has had increased indigestion with anything recently.  She had just started  taking Prevacid the previous day.  She also has a recent history of rapid heart rate that she calls palpitations.  She had no dizziness with the rapid heart rate, but was aware of the heart rate at that time.  She just had her Holter monitor removed yesterday.  She does report that after the Holter monitor was removed, while she was driving home, she suddenly did not know where she was and had to stop and think about it for a minute prior to realizing where she was.  On exam at that time she appeared in no acute distress.  Her blood pressure is 167/84, heart rate 83 and oxygen saturation 99% on 2 liters.  First set of cardiac enzymes were negative and other labs are stable.  EKG shows sinus rhythm with no acute change.  At that time she was planned for admission to telemetry.  We will check serial enzymes to rule out MI. She was placed on Lovenox and nitroglycerin paste.  As well we will treat her with Nexium b.i.d.  We will plan for cardiac catheterization on Monday.  HOSPITAL COURSE:  On June 06, 2000 Amy Bolton remained stable with no complaints.  Her enzymes continued to be negative and she was awaiting her cardiac catheterization.  On June 07, 2000 Amy Bolton underwent cardiac catheterization by Dr. Nanetta Batty.  She was found to have normal LV function.  She was found  to have a proximal 40-50% LAD lesion, otherwise her left main, left circumflex and RCA were with no coronary disease.  She was felt to have noncardiac chest pain and will be treated proton pump inhibitor, and she was planned for discharge home later that afternoon if stable.  However, that afternoon at 4 P.M. she was hypertensive with a blood pressure of 178/75.  This was treated with Lopressor 5 mg IV times one.  Her blood pressure has been down to 148/64.  We plan to increase the Norvasc to 10 mg a day for outpatient control as her blood pressure was also somewhat elevated at admission at which time it  was 167/84.  On June 08, 2000 Amy Bolton was without complaints.  She was afebrile at 98.3, pulse 75 and blood pressure was controlled 135/55.  Her exam was benign and her right groin had no ecchymosis, no hematoma and no bruit.  Her blood pressure was now well controlled and she was stable post cardiac catheterization.  She was felt to be stable for discharge home at this time.  HOSPITAL CONSULTS:  None.  HOSPITAL PROCEDURES: 1. Cardiac catheterization by Dr. Nanetta Batty on June 07, 2000.    This revealed the following:    a) Left main normal.    b) LAD - smooth segmental proximal 40-50% stenosis.    c) Left circumflex free of significant disease.    d) RCA - large dominant vessel, free of disease. 2. Left ventriculography showed EF 60%.  LABORATORY DATA:  TSH normal at 1.370.  Cardiac enzymes negative with CKs of 70, 56 and 45.  MB 1.2, 1.2 and 1.3.  Troponin 0.01, 0.01 and 0.01.  Lipid profile showed total cholesterol 180, triglyceride 91, HDL 41 and LDL 121.  On admission PT 12.0, INR 0.9 and PTT 31.  Also on admission metabolic profile showed sodium 137, potassium 3.8, glucose 88, BUN 35, and creatinine 1.4. LFTs revealed AST 116, ALT 77, _____  118 and total bili 0.6.  CBC on admission was WBC 10.6, hemoglobin 13.7, hematocrit 41.1 and platelets 306,000.  All laboratories remained within stable range throughout the hospitalization.  At discharge:  WBC 10.7, hemoglobin 14.0, hematocrit 41.5 and platelets 369,000.  Occult blood was negative.  As well post cardiac catheterization metabolic profile showed sodium 138, potassium 4.1, BUN 28 and creatinine 1.3.  Again, occult blood was negative.  Radiology:  Chest x-ray June 04, 2000 showed no acute pulmonary disease.  EKG showed normal sinus rhythm with nonspecific ST-T changes; no acute change.  DISCHARGE MEDICATIONS: 1. Norvasc 10 mg once a day.  2. Atacand 32 mg once a day. 3. Zocor 40 mg each night. 4.  Calcium as before. 5. Protonix 40 mg once a day.  DISCHARGE INSTRUCTIONS:  No strenuous activities, lifting greater than 5 pounds or driving for three days.  DIET:  Low-salt, low-fat, low-cholesterol diet.  _______________ with warm water and soap.  Call the office at [phone] if any bleeding or increased size or pain in the groin.  FOLLOW-UP:  In the [facility] office with Dr. Alanda Amass and Meriam Sprague on February 14 at 9:45 A.M. DD:  06/29/00 TD:  06/30/00 Job: [id number]

## 2010-09-26 NOTE — Op Note (Signed)
NAME:  Amy Bolton, Amy Bolton                ACCOUNT NO.:  [id number]   MEDICAL RECORD NO.:  [id number]          PATIENT TYPE:  AMB   LOCATION:  DAY                           FACILITY:  [facility]   PHYSICIAN:  Lionel December, M.D.    DATE OF BIRTH:  06/24/1927   DATE OF PROCEDURE:  08/19/2004  DATE OF DISCHARGE:                                 OPERATIVE REPORT   PROCEDURE:  Colonoscopy.   INDICATIONS:  Dewayne Hatch is a 77 year old Caucasian female with a recent change in  her bowel habits. She has developed constipation. She passes a small amount  of stool three or four times a day. However, she has not had any bleeding.  Family history is positive for colorectal carcinoma in a sister at age 46  who is undergoing chemo. Procedure and risks were reviewed with the patient,  and informed consent was obtained.   PREMEDICATION:  Demerol 50 mg IV, Versed 6 mg IV in divided dose.   Please note the patient was given SBE prophylaxis given heart murmur. 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   FINDINGS:  Procedure performed in endoscopy suite. The patient's vital signs  and O2 saturation were monitored during the procedure and remained stable.  The patient was placed in left lateral position and rectal examination  performed. No abnormality noted on external or digital exam. Olympus  videoscope was placed in rectum and advanced under vision into sigmoid  colon. She had some pigmentation consistent with melanosis coli. Scattered  diverticula noted at sigmoid colon. Scope was carefully advanced into  descending colon. There was 5-mm polyp which was easily ablated via cold  biopsy. Her preparation was satisfactory. Scope was advanced to cecum which  was identified by ileocecal valve. Blunt end of cecum was well seen.  Pictures taken for the record. Please note that the appendiceal orifice was  not seen. She had a scar in its place  indicating prior appendectomy. As the  scope was withdrawn, colonic mucosa was carefully examined. There was a 4 to  5 mm polyp at descending colon which was ablated completely via cold biopsy.  Smaller polyp was noted at sigmoid colon and was ablated in similar fashion.  Rectal mucosa was normal. Scope was retroflexed to examine anorectal  junction. Moderate size hemorrhoids were noted below the dentate line along  with slender anal papilla. Pictures taken for the rectum. Endoscope was  straighten and withdrawn. The patient tolerated the procedure well.   FINAL DIAGNOSIS:  Sigmoid colon diverticulosis. Two small polyps, both of  which were ablated via cold biopsy, one from sigmoid and other from  descending colon.   RECOMMENDATIONS:  1.  Standard instructions given.  2.  High-fiber diet.  3.  FiberChoice 2 tablets q.d.  4.  Lactulose 1 to 2 tablespoonful q.h.s.  5.  I will be contacting the patient with biopsy results and further      recommendations. She will be seen in the office in 8 weeks to make sure  she is responding to therapy.      NR/MEDQ  D:  08/19/2004  T:  08/19/2004  Job:  [id number]   cc:   [name], MD  Fax: [phone]   Tilda Burrow, M.D.  [address] [place]  [place] [address]  Fax: [phone]

## 2010-09-26 NOTE — H&P (Signed)
NAME:  Amy Bolton, Amy Bolton                          ACCOUNT NO.:  [id number]   MEDICAL RECORD NO.:  [id number]                   PATIENT TYPE:  INP   LOCATION:  3003                                 FACILITY:  [facility]   PHYSICIAN:  Payton Doughty, M.D.                   DATE OF BIRTH:  1927/06/03   DATE OF ADMISSION:  08/11/2002  DATE OF DISCHARGE:                                HISTORY & PHYSICAL   ADMISSION DIAGNOSIS:  Herniated disk and spondylosis, C5-6.   This is a now 77 year old right-handed white lady who underwent a C6-7  anterior cervical diskectomy and fusion almost 10 years ago.  She has done  well.  Has developed increasing neck pain and spondylosis C5-6 and is  admitted for an anterior cervical diskectomy and fusion at that level.  Her  medical history is remarkable for a little bit of hypercholesterolemia.  She  takes Zocor 40 mg a day, Toprol 20 mg a day, Nexium 40 mg a day, Xanax at  night.  She takes Atacan on a daily basis as well.   ALLERGIES:  PENICILLIN.   SURGICAL HISTORY:  Anterior cervical and she has also had a lumbar fusion in  L3-4 and L4-5.   SOCIAL HISTORY:  She does not smoke and does not drink.  She is a retired  Tree surgeon.   FAMILY HISTORY:  Noncontributory.   REVIEW OF SYSTEMS:  Remarkable for neck pain and pain in her arm.   PHYSICAL EXAMINATION:  HEENT:  Within normal limits.  NECK:  She has reasonable range of motion in neck although it does hurt down  her left arm when she moves her head.  CHEST:  Clear.  CARDIAC:  Regular  rate and rhythm.  ABDOMEN:  Nontender.  No hepatosplenomegaly.  EXTREMITIES:  Without clubbing or cyanosis.  GU:  Deferred.  NEUROLOGIC:  Peripheral pulses are good.  Neurologically, she is awake,  alert and oriented.  Cranial nerves are intact.  Motor exam shows 5/5  strength throughout the upper and lower extremities save for the left biceps  which is 4/5.  She has left C6 numbness.  Deep tendon reflexes are biceps 2  on  the right, absent on the left.  The remainder are 1 in the upper  extremities, 1 at the knees, absent at the ankles.   She comes accompanied with an MRI that shows spondylosis at 5-6.   CLINICAL IMPRESSION:  Left C6 radiculopathy related to spondylosis.    PLAN:  Herniated diskectomy and fusion at C5-6.  Risks and benefits of  procedure have been discussed with her and she wishes to proceed.  Payton Doughty, M.D.    MWR/MEDQ  D:  08/11/2002  T:  08/12/2002  Job:  [id number]

## 2010-09-26 NOTE — Cardiovascular Report (Signed)
NAMEDEALIE, Amy Bolton NO.:  [id number]   MEDICAL RECORD NO.:  [id number]          PATIENT TYPE:  INP   LOCATION:  2035                         FACILITY:  [facility]   PHYSICIAN:  Thereasa Solo. Little, M.D. DATE OF BIRTH:  1927/09/29   DATE OF PROCEDURE:  12/08/2005  DATE OF DISCHARGE:                              CARDIAC CATHETERIZATION   INDICATIONS FOR TEST:  This 77 year old female was admitted on 12/07/2005  with chest pain.  She has chronic reflux which I consider severe but she  states this was clearly not reflux and was a different type of pain.  Her  cardiac enzymes are unremarkable.  Her EKG is basically normal.  Because of  the chest pain, she is brought to the cardiac cath lab.  It should pointed  out that her nitroglycerin was soft, and she began having recurrent chest  pain, and it resolved when her IV nitroglycerin was restarted.   After obtaining informed consent, the patient was prepped and draped in the  usual sterile fashion, exposing the right groin.  Applying local anesthetic  with 1% Xylocaine, the Seldinger technique was employed, and a 5-French  introducer sheath was placed into the right femoral artery.  Left and right  coronary arteriography, ventriculography in the RAO projection, and an  aortic root was performed.   COMPLICATIONS:  None.   EQUIPMENT:  5-French Judkins diagnostic catheters.   TOTAL CONTRAST USED:  80 mL.   RESULTS:  1.  Hemodynamic monitoring:  Central aortic pressure was 161/69.  Left      ventricular pressure was 164/7 with no significant valve gradient noted      at the time of pullback.  2.  Ventriculography:  Ventriculography in the RAO projection using 20 mL of      contrast at 12 mL per second revealed the left ventricle to be      hyperdynamic.  There was not obliteration of the mid cavity.  Ejection      fraction was in excess of 65%, and the left ventricular end-diastolic      pressure was 14.  During the  ventriculogram, the aortic root appeared to      be slightly dilated.   Aortic root:  An aortic root was performed.  30 mL of contrast at 15 mL per  second revealed no aortic insufficiency.  The ascending aortic root appeared  to be slightly dilated; however, it appeared to be smooth in contour with no  evidence of a dissection.   Coronary arteriography:  On fluoroscopy, minimal calcification seen in the  distribution of the proximal LAD.   1.  Left main normal, it bifurcated.  2.  LAD:  The LAD extended down to the apex of the heart but did not reach      the apex of the heart.  It gave rise to 2 small diagonal branches, and      there were minimal irregularities in the proximal LAD.  There were no      high-grade stenoses.  3.  Circumflex:  The circumflex itself was a medium-sized vessel  that gave      rise to 3 small OM vessels, all of which were free of disease.  4.  Right coronary artery:  This was a dominant vessel with a PDA and 2      posterior lateral vessels all of which were free of disease.   CONCLUSION:  1.  No significant coronary disease.  2.  Hyperdynamic left ventricle.  3.  Mild dilatation with aortic root.   I cannot explain her chest pain based on her cardiac anatomy.  Her aortic  root being slightly dilated appears to be new from her last cardiac  catheterization which I reviewed from 2002.  Because of this, I have ordered  a CT scan of her chest to evaluate both her aorta and make sure she has not  had pulmonary emboli.  Because of the severity of her reflux, I have put her  on PPIs b.i.d., and this may very well be the explanation for her chest  pain, although her perception is this was not her normal reflux pattern.           ______________________________  Thereasa Solo. Little, M.D.     ABL/MEDQ  D:  12/08/2005  T:  12/08/2005  Job:  [id number]   cc:   Gerlene Burdock A. Alanda Amass, M.D.  Fax: [phone]   [name], MD  Fax: [phone]   Cath Lab

## 2010-09-26 NOTE — Discharge Summary (Signed)
NAMEAMAND, Amy Bolton                ACCOUNT NO.:  [id number]   MEDICAL RECORD NO.:  [id number]          PATIENT TYPE:  INP   LOCATION:  1613                         [facility]ACILITY:  [facility]   PHYSICIAN:  [name], MD       DATE OF BIRTH:  [birth year]   DATE OF ADMISSION:  07/24/2005  DATE OF DISCHARGE:  07/26/2005                                 DISCHARGE SUMMARY   PRIMARY CARE Annaleia Pence:  Dr. Creola Corn   DISCHARGE DIAGNOSES:  1.  Viral gastroenteritis, presumed Nor[place] virus.  2.  Dehydration.  3.  Persistent nausea and vomiting.  4.  Failure to thrive.  5.  Dehydration.  6.  Abdominal pain.  7.  Chronic hypertension.  8.  Hyperlipidemia.  9.  Anxiety.  10. Gastroesophageal reflux disease.  11. Hiatal hernia.  12. Nonobstructive coronary artery disease.  13. History of stress incontinence.  14. Allergic rhinitis.  15. Osteoarthritis.  16. Status post lumbar and cervical laminectomies.  17. Tonsil and adenoidectomy.  18. Total abdominal hysterectomy.  19. History of cholecystectomy.  20. History of bladder tack operation.  21. Bilateral rotator cuff surgery.   DISCHARGE MEDICATIONS:  She is to return to her home medication list but  wait one more day before restarting the Atacand.   DISCHARGE PROCEDURES:  Include medical management.   HISTORY OF PRESENT ILLNESS:  Briefly, this is a 77 year old female who was  admitted directly from home to the floor on July 24, 2005, with a few days  of nausea, vomiting, diarrhea, abdominal pain.  She had a prior ER visit and  was diagnosed with gastroenteritis, given IV fluids, Zofran, Phenergan, and  Imodium, and was sent home.  All day Wednesday and Thursday she stayed in  bed and not doing well, but started getting sicker again on Thursday.  Overnight Thursday she vomited four times, was not able to keep anything  down, and she has been unable to keep p.o.'s including liquids down.  Therefore, because of persistent nausea, vomiting,  diarrhea, known  dehydration, failure to thrive, weakness, and difficulty keeping down  p.o.'s, although she had been tolerating a little bit of Pedialyte, we  elected to admit her for further evaluation and treatment.   HOSPITAL COURSE:  This is an elderly female with severe viral  gastroenteritis, question Norwalk virus, with symptoms including intractable  nausea, vomiting, diarrhea, abdominal pain, and dehydration.  She was  admitted, given IV fluids, given IV Phenergan, given Protonix IV, and was  given bowel rest, was given Imodium as needed.  Labs and a KUB were  obtained.  After 24-36 hours she improved markedly and dramatically.  All of  her labs remained fine except for slightly elevated liver tests.  Blood  pressure medicines were put on hold.  Her husband eventually got admitted  with the same issues.  On July 26, 2005, she was feeling great, had one  episode of nausea and spitting up, but she was back to her baseline.  We  watched her throughout the morning.  She tolerated lunch okay.  Her blood  pressure  was fine.  All of her vital signs remained stable and she was  discharged to home in stable condition later that afternoon.  The KUB was  compatible with just gastroenteritis.  She will follow up with me as an  outpatient.      [name], MD  Electronically Signed     JMR/MEDQ  D:  08/20/2005  T:  08/20/2005  Job:  [id number]   cc:   Harvie Junior, M.D.  Fax: [phone]   Payton Doughty, M.D.  Fax: [phone]   Richard A. Alanda Amass, M.D.  Fax: [phone]   Tilda Burrow, M.D.  Fax: [phone]

## 2010-09-26 NOTE — Op Note (Signed)
NAME:  [name], Amy Bolton                          ACCOUNT NO.:  [id number]   MEDICAL RECORD NO.:  [id number]                   PATIENT TYPE:  AMB   LOCATION:  DAY                                  FACILITY:  [facility]   PHYSICIAN:  Lionel December, M.D.                 DATE OF BIRTH:  11/27/1927   DATE OF PROCEDURE:  04/29/2002  DATE OF DISCHARGE:                                 OPERATIVE REPORT   PROCEDURE:  Esophagogastroduodenoscopy.   INDICATIONS:  The patient is a 76 year old Caucasian female with noncardiac  chest pain.  She does give history of intermittent heartburn and  regurgitation but denies dysphagia, hoarseness, or chronic cough.  She has  been prescribed Protonix, but she does not like to take the medication.  She  is undergoing diagnostic EGD.  The procedure and risks were reviewed with  the patient.  Informed consent was obtained.   PREMEDICATION:  Cetacaine spray for pharyngeal topical anesthesia, Demerol  25 mg IV, Versed 4 mg IV in divided dose.   INSTRUMENT USED:  Olympus video system.   FINDINGS:  Procedure performed in endoscopy suite.  The patient's vital  signs and O2 saturation were monitored during the procedure and remained  stable.  The patient was placed in the left lateral recumbent position and  the endoscope was passed via oropharynx without any difficulty into  esophagus.   Esophagus:  Mucosa of the esophagus normal, except she had focal erythema at  GE junction.  A small sliding hiatal hernia 3 cm in length.   Stomach:  It was empty and distended very well with insufflation.  Folds of  the proximal stomach were normal.  Examination of the mucosa had, gastric  body was normal.  Antral mucosa revealed patchy erythema and granularity.  No erosions or ulcers noted.  Pyloric channel was patent.  Angularis and  fundus were examined by retroflexing the scope and were normal.   Duodenum:  Examination of the bulb revealed normal mucosa.  The scope was  passed  to the second part of the duodenum.  The mucosa and folds were  normal.  The endoscope was withdrawn.  The patient tolerated the procedure  well.   FINAL DIAGNOSES:  1. Small sliding hiatal hernia with mild changes of reflux esophagitis     limited to the gastroesophageal junction.  2. Nonerosive antral gastritis.   RECOMMENDATIONS:  1. I would like for her to continue antireflux measures, and she must take a     PPI every day.  Will switch her to Prilosec 20 mg p.o. q.a.m. because of     cost reasons.  2. She will follow up with Dr. Ouida Sills in four weeks.  If her symptoms are not     completely abolished, she may need     further evaluation.  3. H. pylori serology will be checked today, and I will contact the patient  with the results and further recommendations.                                               Lionel December, M.D.    NR/MEDQ  D:  04/28/2002  T:  04/29/2002  Job:  [id number]

## 2010-09-26 NOTE — Op Note (Signed)
NAME:  Amy Bolton, Amy Bolton                          ACCOUNT NO.:  [id number]   MEDICAL RECORD NO.:  [id number]                   PATIENT TYPE:  AMB   LOCATION:  DSC                                  FACILITY:  [facility]   PHYSICIAN:  Harvie Junior, M.D.                DATE OF BIRTH:  02-07-1928   DATE OF PROCEDURE:  03/21/2003  DATE OF DISCHARGE:                                 OPERATIVE REPORT   PREOPERATIVE DIAGNOSIS:  CMC arthritis, left thumb.   POSTOPERATIVE DIAGNOSIS:  CMC arthritis, left thumb.   PROCEDURE:  Burton interpositional arthroplasty, left thumb.   SURGEON:  Harvie Junior, M.D.   ASSISTANT:  Marshia Ly, P.A.   ANESTHESIA:  General.   INDICATIONS FOR PROCEDURE:  This is a 77 year old female with a long history  of having significant pain in the left thumb, who has tried conservative  care with anti-inflammatory medications, splinting, and steroid injections.  These all worked, but ultimately failed and because of continuing complaints  of pain, inability to sleep, inability to use the hand, she was ultimately  taken to the operating room for [facility] arthroplasty.   DESCRIPTION OF PROCEDURE:  The patient was taken to the operating room and  general anesthesia was obtained with general endotracheal, the patient was  placed supine on the table.  The left arm was prepped and draped in the  usual sterile fashion.  Following this, a curved incision was made over the  trapezium.  Subcutaneous tissue dissected down to the level of the capsule.  The capsule was opened and the capsular edges were tagged.  The trapezium  was then identified and excised in total.  At this point, a drill was used  to make a 1/4 inch drill hole on the ulnar aspect of the thumb and this was  connected to a dorsal hole in the thumb metacarpal which was perpendicular  to the thumb nail.  At this point a K-wire was used with the thumb held in  the abducted position, held out to length, and a K-wire was  advanced down  the shaft and into the carpal bones to hold the length open in the abducted  position of the thumb.  At this point, the tendon of the flexor carpi  radialis was harvested all the way to the myotendinous junction.  It was  identified and divided there and then brought into the wound.  At this point  it was placed through this hole and then folded back on itself.  A #2 Tycron  was used to suture this in place at this point.  Following this, attention  was turned toward 3-0 Tycron sutures were placed in the base of the defect  and the flexor carpi radialis was then folded back on itself, back and  forth, on Anchovy over Perry needles and this was sewn into the floor of  this space.  At this point, a second suture which had previously been placed  was tied over this situation and it was also tied in place.  At this point,  the capsular flaps were identified and a tight capsular closure was  undertaken.  The tendinous portion at this point was oversewn to the closure  of the extensor of the thumb.  At this point, the K-wire was cut and bent.  A sterile compressive dressing was applied at this point and the patient was  taken to the recovery room where she was noted to be in satisfactory  condition.  The patient had the wounds with a flexor carpi radialis  tendon as well as the wound for the arthroplasty.  A sterile compressive  dressing was applied at this point as well as a thumb spica splint and she  was taken to the recovery room where she was noted to be in satisfactory  condition.  Estimated blood loss for the procedure was none.                                               Harvie Junior, M.D.    Ranae Plumber  D:  03/21/2003  T:  03/22/2003  Job:  [id number]

## 2010-09-26 NOTE — Cardiovascular Report (Signed)
[place]. [facility]  Patient:    Amy Bolton, Amy Bolton                       MRN: [id number] Proc. Date: 06/07/00 Adm. Date:  [id number] Attending:  Jim Desanctis CC:         Cardiac Catheterization Laboratory  Carylon Perches, M.D., [place] [facility]  [facility], [place] [address] [place], [place] [address]   Cardiac Catheterization  INDICATIONS:  Amy Bolton is a 77 year old, white female, patient of Dr. Karleen Hampshire, who I performed cardiac catheterization on approximately three years ago.  Her cardiac risk factor profile is positive for hyperlipidemia and hypertension.  She has a hiatal hernia with GERD.  She ruled out for myocardial infarction.  She presents now for diagnostic coronary arteriography.  DESCRIPTION OF PROCEDURE:  The patient was brought to the second floor [place] [facility] in the postabsorptive state.  She was premedicated with p.o. Valium, prednisone, and Pepcid as well as Benadryl for contrast allergy prophylaxis.  Her right groin was prepped and shaved in the usual sterile fashion.  Xylocaine 1% was used for local anesthesia.  A 6 French sheath was inserted into the right femoral artery using standard Seldinger technique.  A 6 French right and left Judkins diagnostic catheter as well as a 6 French catheter were used for selective coronary angiography, left ventriculography, and distal abdominal aortography.  Omnipaque dye was used for the entirety of the case.  Retrograde, aortic, left ventricular and pullback pressures were recorded.  HEMODYNAMICS: 1. Aortic systolic pressure 173, diastolic pressure 73. 2. Left ventricular systolic pressure 177 and diastolic pressure 7.  SELECTIVE CORONARY ANGIOGRAPHY: 1. Left main:  Normal. 2. Left anterior descending:  The LAD had a smooth segmental proximal    40-50% stenosis. 3. Left circumflex:  This vessel is free of significant disease. 4. Right  coronary artery:  This is a large dominant vessel and is free of    significant disease.  LEFT VENTRICULOGRAPHY:  The RAO left ventriculogram was performed using 25 cc of Omnipaque dye at 12 cc/sec.  The overall LVEF is estimated at greater than 60% without focal wall motion abnormalities.  IMPRESSION:  Amy Bolton has noncritical coronary artery disease with normal left ventricular function.  I believe her chest pain was noncardiac, most likely gastrointestinal.  DISPOSITION:  She will be discharged home on a proton pump inhibitor. An ACT was measured and the sheaths were removed.  Pressure was held on the groin to achieve hemostasis.  The patient left the lab in stable condition. She will be discharged home later today and will see me back in the office in approximately two weeks.  Dr. Carylon Perches was notified of these results. DD:  06/07/00 TD:  06/07/00 Job: [id number]

## 2010-09-26 NOTE — Op Note (Signed)
[place]. [facility]  Patient:    Amy Bolton, Amy Bolton                       MRN: [id number] Proc. Date: 07/18/99 Adm. Date:  [id number] Attending:  Emeterio Reeve                           Operative Report  PREOPERATIVE DIAGNOSIS:  Spondylosis of L3-4 and L4-5.  POSTOPERATIVE DIAGNOSIS:  Spondylosis of L3-4 and L4-5.  PROCEDURE:  L3-4 and L4-5 laminectomy, diskectomy, and posterior lumbar interbody fusion with a Ray threaded fusion cage.  SURGEON:  Payton Doughty, M.D.  ASSISTANT:  None.  ANESTHESIA:  General endotracheal.  PREPARATION:  Prepped with sterile Betadine, and prepped and scrubbed with alcohol wipe.  COMPLICATIONS:  None.  INDICATIONS:  This is a 77 year old right-handed white lady with severe lumbar spondylosis.  DESCRIPTION OF PROCEDURE:  She was taken to the operating room, smoothly anesthetized and intubated, and placed prone on the operating table.  Following  shave, prep, and drape in the usual sterile fashion, the skin was infiltrated with 1% lidocaine with 1:400,000 epinephrine.  An skin incision was made from the bottom of L2 to the top of L5, and the lamina of L3, L4, and L5 were exposed bilaterally in the subperiosteal plane.  Intraoperative x-ray confirmed correctness of level under L3.  The lamina, pars interarticularis, and inferior facet of L3 and L4, nd the superior facet of L4 and L5 were removed bilaterally, and bone satisfied for grafting.  The ligamentum flavum was removed.  The 3, 4, and 5 roots were dissected free as they coursed around their respective pedicles and then decompressed. On the right side at 4-5, there was a modest disk herniation which was contained under annular fibers.  The remainder of the pathology appeared to be largely degenerative in nature with large facet joints.  Following diskectomy of both 4-5 and 5-1, Ray threaded fusion cages, 14 x 21 mm  were placed at each level.   Intraoperative x-ray confirmed good placement of the cages.  They _________ and bone graft harvested from facet joints and capped. he wound was irrigated and hemostasis assured.  The fascia was reapproximated with 0 Vicryl in an interrupted fashion. Subcutaneous tissue was reapproximated with 0 Vicryl in an interrupted fashion, and subcuticular tissues reapproximated with 3-0 Vicryl in an interrupted fashion. The skin was closed with 3-0 nylon in a running locked fashion.  A Betadine Telfa dressing was applied and made occlusive with Op-Site.  The patient then returned to the recovery room in good condition. DD:  07/18/99 TD:  07/20/99 Job: [id number]

## 2010-09-26 NOTE — Op Note (Signed)
NAME:  Amy Bolton, Amy Bolton                          ACCOUNT NO.:  [id number]   MEDICAL RECORD NO.:  [id number]                   PATIENT TYPE:  AMB   LOCATION:  DSC                                  FACILITY:  [facility]   PHYSICIAN:  Harvie Junior, M.D.                DATE OF BIRTH:  [birth year]   DATE OF PROCEDURE:  06/29/2003  DATE OF DISCHARGE:                                 OPERATIVE REPORT   PREOPERATIVE DIAGNOSIS:  Impingement right shoulder with questionable  rotator cuff tear with AC joint arthritis.   POSTOPERATIVE DIAGNOSIS:  Impingement right shoulder with questionable  rotator cuff tear with AC joint arthritis.   PROCEDURE:  1. Repair of small rotator cuff tear through a mini open exposure.  2. Arthroscopic acromioplasty anterolateral.  3. Distal clavicle resection arthroscopic.   SURGEON:  Harvie Junior, M.D.   ASSISTANT:  Marshia Ly, P.A.   ANESTHESIA:  Interscalene block and general.   INDICATIONS FOR PROCEDURE:  She is a 77 year old female with a long history  of having significant right shoulder pain.  She ultimately was evaluated in  the office and noted to have impingement findings with weakness.  We  injected her shoulder which helped, but the pain recurred and because of  inability to sleep and continued complaints of shoulder pain, she was  ultimately taken to the operating room for arthroscopic evaluation and  fixation as needed.   DESCRIPTION OF PROCEDURE:  The patient was taken to the operating room and  after adequate anesthesia obtained with the general endotracheal, the  patient was placed supine on the operating table.  She was moved to the  beach chair position and all bony prominences were well padded.  Attention  at this time, was turned to the right shoulder which was prepped and draped  in the usual sterile fashion.  Following this, routine arthroscopic  examination of the shoulder revealed that there was a high grade partial  thickness tear at  the leading edge of the supraspinatus tendon. This was  debrided with a suction shaver and it was clear that there was a small full  thickness tear at this area that appeared to be about 1 cm in length.  At  this point, attention was turned to the posterior labrum where there was  noted also to be a posterior labral tear.  This was debrided.  The biceps  tendon was noted to be well fixed.  There was no evidence of arthritic  change. The rest of the labrum was intact.  At this point, the cannula was  taken out of the glenohumeral joint into the subacromial space and  anterolateral acromioplasty was performed from a lateral and posterior  compartment.  Attention was then turned to the anterior portal where a  distal clavicle resection was undertaken through the anterior compartment.  AT that point, the case was stopped and then a small  incision was made  laterally and subcutaneous tissue dissected down to the level of the rotator  cuff which had clearly been identified from the under surface and superior  surface during the arthroscopy.  The deltoid was divided and the rotator  cuff tear was identified.  A single 5 mm suture anchor was put in place and  anterior and posterior limbs were tied to the anterior and posterior  portions of the rotator cuff.  Excellent repair of the cuff was achieved at  this point.  At this point, the shoulder was copiously irrigated and  suctioned dry.  The deltoid was closed with an 0 Vicryl  interrupted suture.  The skin was closed with 0 and 2-0 Vicryl and the skin  with Monocryl.  Benzoin and Steri-Strips were applied.  The patient was  taken to the recovery room where she was noted to be in satisfactory  condition.  Estimated blood loss for the procedure was none.                                               Harvie Junior, M.D.    Ranae Plumber  D:  06/29/2003  T:  06/30/2003  Job:  [id number]

## 2010-09-26 NOTE — Discharge Summary (Signed)
NAMESHAWNA, WEARING                ACCOUNT NO.:  [id number]   MEDICAL RECORD NO.:  [id number]          PATIENT TYPE:  INP   LOCATION:  2035                         FACILITY:  [facility]   PHYSICIAN:  Lezlie Octave, N.P.     DATE OF BIRTH:  04-16-1928   DATE OF ADMISSION:  12/06/2005  DATE OF DISCHARGE:  12/09/2005                                 DISCHARGE SUMMARY   [name] is a 77 year old female patient who has a prior history of  nonobstructive coronary disease.  They came into the emergency room with  complaints of chest pain.  She was admitted to the hospital for unstable  angina.  She was started on IV heparin.  It was decided that she should  undergo cardiac catheterization.  Her cardiac enzymes were negative.  Her  cath was done by Dr. Julieanne Manson.  It showed her left vein was normal.  Her circ had 3 small OMs, they were normal.  LAD to apex had some minimal  irregularity.  RC was dominant vessel and was also clear.  Since she had  normal coronaries with an EF of 65%, he decided she needed to undergo CT of  chest to rule out PE and evaluate her aorta.  She underwent the CT scan the  following day.  It was negative for aneurysm or dissection or PE, and she  was considered stable to be discharged home.  Her blood pressure was 130/76.   LABS:  Her hemoglobin 13.9, hematocrit 41.8, platelets 262, and WBCs 9.0.  Sodium was 140, potassium 4.3, BUN 20, creatinine 1.5, and glucose was 104.   MEDICATIONS AT DISCHARGE:  1. Atacand HCT 116/12.5, 1 a day.  2. Toprol XL 37.5 mg once per day.  3. Aspirin 81 mg once per day.  4. Klonopin 1 mg 1 time per day.  5. Protonix 40 mg 1 time per day.   She is to do no strenuous activity, lifting, pushing, pulling x4 days.  No  driving x1 day.  She will follow up with Dr. Alanda Amass on 12/15/05, at 12:00  noon.   DISCHARGE DIAGNOSES:  1. Chest pain, not cardiac ischemia, probably related to gastroesophageal      reflux disease.  2.  Gastroesophageal reflux disease.  3. Hypertension.  4. Dyslipidemia.  5. Status post cardiac cath with normal course, normal LV function.  6. Status post CT scan, was negative for PE.  It also showed no acute      findings of the thoracic aorta.  It did demonstrate some irregular      mural thrombus near the diaphragmatic hiatus, which is similar to the      abdominal CT done on January 07, 2005.  __________  for Dr. Julieanne Manson.     Lezlie Octave, N.P.    BB/MEDQ  D:  01/31/2006  T:  02/01/2006  Job:  [id number]   cc:   Kingsley Callander. Ouida Sills, MD

## 2010-09-26 NOTE — Op Note (Signed)
NAME:  Amy Bolton, Amy Bolton                          ACCOUNT NO.:  [id number]   MEDICAL RECORD NO.:  [id number]                   PATIENT TYPE:  AMB   LOCATION:  DSC                                  FACILITY:  [facility]   PHYSICIAN:  Harvie Junior, M.D.                DATE OF BIRTH:  [birth year]   DATE OF PROCEDURE:  12/14/2003  DATE OF DISCHARGE:                                 OPERATIVE REPORT   PREOPERATIVE DIAGNOSIS:  Rotator cuff tear with impingement and  acromioclavicular joint arthritis.   POSTOPERATIVE DIAGNOSIS:  Rotator cuff tear with impingement and  acromioclavicular joint arthritis.   PROCEDURE:  1. Mini-open rotator cuff repair of acute rotator cuff tear.  2. Anterolateral acromioplasty from a lateral and posterior portal.  3. Distal clavicle resection from an anterior portal.   SURGEON:  Harvie Junior, M.D.   ASSISTANT:  None.   ANESTHESIA:  General.   BRIEF HISTORY:  Amy Bolton is a 77 year old female with a long history of  having right shoulder pain.  She ultimately underwent rotator cuff repair  right which did well for her and she ultimately began having left shoulder  pain.  She was treated conservatively with injection therapy and anti-  inflammatory medication.  An MRI was obtained which showed rotator cuff  tear.  We talked about treatment options and ultimately felt that rotator  cuff repair was the most appropriate course of action.  She is brought to  the operating room for this procedure.   DESCRIPTION OF PROCEDURE:  The patient was taken to the operating room and  after adequate anesthesia was obtained with general anesthetic, the patient  was placed on the operating table, the left arm was prepped and draped in  the usual sterile fashion.  Following this, routine arthroscopic examination  of the left shoulder revealed there was an obvious tear of the rotator cuff  on the under surface.  The glenohumeral joint showed some grade 2 changes on  the humeral  head over about an 8 mm area.  This was debrided minimally with  the suction shaver.  The rotator cuff tear was identified, debrided from  within the glenohumeral joint.  The biceps tendon looked within normal  limits.  At this point, the attention was turned out of the glenohumeral  joint into the subacromial space where an anterolateral acromioplasty was  performed from a lateral and posterior compartment.  The distal clavicle was  identified and 15 mm of distal clavicle was resected through an anterior  portal.  At this point, attention was turned to the rotator cuff superior  surface where the bursectomy was performed and the rotator cuff tear was  identified.  It was a small tear about 7 mm in length but ultimately felt  that with the pain she was having, that this needed to be addressed and, at  this point, the case was stopped.  An incision was made over the lateral  aspect of the arm, the subcutaneous tissues were dissected down to the  deltoid divided in line with the fibers.  The rotator cuff tear was easily  identifiable at this point and the rotator cuff tear was identified,  cleared, and then a single 5 mm suture anchor was used to repair the torn  rotator cuff after a rongeur was used to rongeur the bone in this area.  The  sutures were tied.  Excellent repair of the rotator cuff was achieved.  The  arm was put through a range of motion.  The acromioplasty was perfect.  The  distal clavicle resection was perfect being felt from the under surface.  At  this point, the deltoid was repaired with a #1 Vicryl running suture.  The  shoulder was copiously irrigated and suctioned dry  prior to this closure.  The skin was closed with a combination of 0 and 2-0  Vicryl and a 3-0 Monocryl suture.  Benzoin and Steri-Strips were applied.  A  sterile compressive dressing was applied.  The patient was taken to the  recovery room where she was noted to be in satisfactory condition.  Estimated  blood loss was none.                                               Harvie Junior, M.D.    Ranae Plumber  D:  12/14/2003  T:  12/15/2003  Job:  [id number]

## 2010-09-26 NOTE — Consult Note (Signed)
NAME:  Amy Bolton, Amy Bolton                ACCOUNT NO.:  [id number]   MEDICAL RECORD NO.:  [id number]           PATIENT TYPE:  AMB   LOCATION:                                 FACILITY:   PHYSICIAN:  Amy Bolton, M.D.         DATE OF BIRTH:   DATE OF CONSULTATION:  08/14/2004  DATE OF DISCHARGE:                                   CONSULTATION   CHIEF COMPLAINT:  Change in bowels, change in size of bowel movements.   PHYSICIAN REQUESTING CONSULTATION:  Amy Bolton, M.D.   HISTORY OF PRESENT ILLNESS:  Amy Bolton is a 77 year old Caucasian female  who presents today for further evaluation of several months' history of  change in her bowel movements.  She states that she is having increasing  difficulties having a bowel movement.  She will have a small amount three to  four times a day.  She often has to strain and change positions in order to  get the bowel movement to come out.  She describes her stools as soft but  tinsel-shaped.  She often has a small amount each time.  She always feels  like she has to have a BM.  She complains of abdominal cramps and gas.  Denies any melena or rectal bleeding.  On rectal examination, she recently  had Hemoccult-negative stool.  Denies any diarrhea.  Twelve years ago she  was having lots of diarrhea and had a colonoscopy, but she is not sure of  the results.  She says she was checked for a rectocele recently by Dr.  Emelda Bolton, and he did not feel like this was causing any problems with her  bowel movements.  She previously had a bladder repair and rectocele repair.  She denies any change in her weight.  She has intermittent heartburn related  to certain foods, for which she takes Protonix p.r.n.  Denies any dysphagia  or odynophagia.  Her husband found out last week that he has lung cancer.  Her sister is a patient of ours, Amy Bolton, who recently was found to  have colon cancer.   CURRENT MEDICATIONS:  1.  Zocor 40 mg daily.  2.  Atacand 16 mg  daily.  3.  Toprol 50 mg daily.  4.  Vicodin p.r.n.  5.  Klonopin one day.  6.  Muscle relaxer p.r.n.  7.  Protonix 40 mg daily p.r.n.   ALLERGIES:  PENICILLIN.   PAST MEDICAL HISTORY:  1.  Hypertension.  2.  Hypercholesterolemia.  3.  Chronic arthritis.  4.  Degenerative joint disease.  5.  Irritable bowel syndrome.  6.  Status post partial hysterectomy.  She has one ovary remaining.  7.  She has had a tonsillectomy.  8.  She had a bone removed from her thumb of the left hand.  9.  She has had two surgeries on her neck and two on her back for disk      problems.  10. She has had bilateral shoulder surgery.  11. She had a tubal ligation.  12. She had a  bladder repair and rectocele repair.  13. She has had a cholecystectomy.   FAMILY HISTORY:  Her mother died of Alzheimer's.  Father had COPD.  A sister  recently diagnosed with colon cancer at age 79.  She has positive lymph  nodes and is undergoing chemotherapy and radiation therapy.  She has a  sister who has had a stroke.   SOCIAL HISTORY:  She is married.  Her husband was diagnosed with lung cancer  last week.  She is retired.  She smoked for a short period of time around  age 52 but has since quit.  Denies any alcohol use.   REVIEW OF SYSTEMS:  GASTROINTESTINAL:  See HPI.  CONSTITUTIONAL:  See HPI.  CARDIOPULMONARY:  Denies any chest pain.  She recently had some shortness of  breath and was evaluated by her primary care physician, Amy Bolton, M.D.  However, no significant findings were found, according to her.  Denies any  cough.   PHYSICAL EXAMINATION:  VITAL SIGNS:  Weight 179, height 5 feet 3 inches,  temperature 97.8, blood pressure 140/82, pulse 68.  GENERAL:  A pleasant, elderly Caucasian female in no acute distress.  SKIN:  Warm and dry, no jaundice.  HEENT:  Pupils equal, round, and reactive to light.  Conjunctivae are pink.  Sclerae are nonicteric.  Oropharyngeal mucosa moist and pink.  No lesions,   erythema or exudate.  NECK:  No lymphadenopathy or thyromegaly.  CHEST:  Lungs clear to auscultation.  CARDIAC:  Regular rate and rhythm, normal S1, S2.  A 2/6 systolic ejection  murmur.  ABDOMEN:  Positive bowel sounds, slightly obese but symmetrical and soft.  She has mild lower abdominal tenderness in the midline to deep palpation, no  organomegaly or masses.  No rebound tenderness or guarding.  EXTREMITIES:  No edema.   IMPRESSION:  Amy Bolton is a 77 year old lady with a several-month history  of change in the shape of her bowel movements.  She is also having  increasing difficulty to have a bowel movement.  She has a family history of  colon cancer in a sister.  It has been over 12 years since her last  colonoscopy and given her current symptoms, I recommend colonoscopy at this  time for further evaluation.  I discussed risks, alternatives, benefits and  in particular risk of medication reactions, bleeding, infection or colonic  perforation as well as incomplete exam with the patient, and she is  agreeable to proceed.  She has chronic intermittent gastroesophageal reflux  disease, well-controlled with Protonix.   PLAN:  Colonoscopy in the near future.  Given her heart murmur on  examination, she will be given SBE prophylaxis.      LL/MEDQ  D:  08/14/2004  T:  08/14/2004  Job:  [id number]   cc:   Amy Pounds, MD  Fax: [phone]

## 2010-09-26 NOTE — Discharge Summary (Signed)
   NAME:  Amy Bolton, Amy Bolton                          ACCOUNT NO.:  [id number]   MEDICAL RECORD NO.:  [id number]                   PATIENT TYPE:  INP   LOCATION:  3003                                 FACILITY:  [facility]   PHYSICIAN:  Stefani Dama, M.D.               DATE OF BIRTH:  10/31/1927   DATE OF ADMISSION:  08/11/2002  DATE OF DISCHARGE:  08/13/2002                                 DISCHARGE SUMMARY   ADMITTING DIAGNOSES:  Cervical spondylosis C5-6 with cervical radiculopathy.   POSTOPERATIVE DIAGNOSES:  Cervical spondylosis C5-6 with cervical  radiculopathy.   PROCEDURE:  Anterior cervical diskectomy and arthrodesis with Spectral  Allograft C5-6 on August 11, 2002.   CONDITION ON DISCHARGE:  Improving.   HOSPITAL COURSE:  The patient is a 77 year old individual who has had  significant neck, shoulder, and arm pain.  She was evaluated and found to  have some weakness on the left side in her biceps.  She was advised  regarding anterior diskectomy surgery and this was performed August 11, 2002.  Her postoperative course has been smooth and uneventful.  However, the  patient wished to stay an additional postoperative day at the request of her  family.  Her swallowing has been intact.  She has prescriptions at home for  Tylenol with codeine as she does not like to take muscle relaxers.  She has  been advised as to her postoperative activity.  Her dressing is intact at  the time of discharge.                                               Stefani Dama, M.D.    [name]  D:  08/12/2002  T:  08/14/2002  Job:  [id number]

## 2010-09-26 NOTE — Discharge Summary (Signed)
[place]. [facility]  Patient:    Amy Bolton, Amy Bolton                       MRN: [id number] Adm. Date:  [id number] Disc. Date: 07/23/99 Attending:  Emeterio Reeve                           Discharge Summary  ADMISSION DIAGNOSIS:  Spondylosis L3-4 and L4-5.  DISCHARGE DIAGNOSIS:  Spondylosis L3-4 and L4-5.  PROCEDURES:  L3-4 and L4-5 laminectomy, diskectomy, ______ lumbar interbody fusion with ______ fusion cage.  COMPLICATIONS:  None.  DISCHARGE STATUS:  Alive and well.  HISTORY OF PRESENT ILLNESS:  A 77 year old right-handed white lady who has had  decompressive laminectomy in the past.  History and physical is recounted on the chart.  She has had progressive increasing back and lower extremity pain.  MRI ith significant spondylosis at 3-4 and 4-5.  Discography with strong concordant 3-4  and 4-5 and was indifferent at 5-1.  She is admitted for 3-4 and 4-5 decompression and fusion.  PAST SURGICAL HISTORY:  Hysterectomy.  PAST MEDICAL HISTORY:  No medical illnesses.  PHYSICAL EXAMINATION:  GENERAL:  Intact.  NEUROLOGIC:  Bilateral L4 radiculopathies.  HOSPITAL COURSE:  She was admitted after ascertaining normal laboratory values nd underwent a 3-4 and 4-5 decompression and fusion.  Postoperatively, she has done well.  Her strength is full.  Her incision is dry.  She was a little bit slow to mobilize related to her age.  She had also had some chronic bronchitis and developed a cough from that.  Chest x-ray showed some minimal atelectasis with small bilateral pleural effusions, which did not appear to be particularly menacing.  She is eating and voiding normally now and feels much better from yesterday.  She is being discharged home in the care of her family on Percocet or pain and an incentive spirometer to use.  She knows to contact me for further difficulties.  FOLLOW-UP:  Her follow-up will be at the [facility] office in a week for suture removal. DD:  07/23/99 TD:  07/23/99 Job: [id number]

## 2010-09-26 NOTE — Op Note (Signed)
NAME:  Amy Bolton, Amy Bolton                          ACCOUNT NO.:  [id number]   MEDICAL RECORD NO.:  [id number]                   PATIENT TYPE:  INP   LOCATION:  3003                                 FACILITY:  [facility]   PHYSICIAN:  Payton Doughty, M.D.                   DATE OF BIRTH:  [birth year]   DATE OF PROCEDURE:  08/11/2002  DATE OF DISCHARGE:                                 OPERATIVE REPORT   PREOPERATIVE DIAGNOSIS:  Spondylosis and disk at C5-C6.   POSTOPERATIVE DIAGNOSIS:  Spondylosis and disk at C5-C6.   OPERATION/PROCEDURE:  C5-C6 anterior cervical decompression and fusion with  __________ plate.   SURGEON:  Payton Doughty, M.D.   ANESTHESIA:  General endotracheal.   PREPARATION:  Prepped sterilely and prepped with alcohol wipe.   COMPLICATIONS:  None.   ASSISTANT:  Nurse assistant - [place].  Doctor assistant - Stefani Dama, M.D.   DESCRIPTION OF PROCEDURE:  This is a 77 year old lady with severe  spondylosis at C5-C6.  She has had a prior C6-7 fusion.  She is taken to the  operating room, where she was intubated and placed supine on the operating  table in the Holter head traction following shaving, prep and drape in the  usual sterile fashion.  Skin was incised in the midline immediately below the sternocleidomastoid  muscle on the left side.  The platysma was identified, elevated and  undermined.  Sternocleidomastoid is identified and resection revealed the  carotid artery retracted to the left.  Trachea and esophagus freed up and  retracted laterally to the right, exposing the bones of the anterior  cervical spine.  Marker was placed and intraoperative x-ray was obtained to  confirm correct level.  Having confirmed correct level, diskectomy and  decompression was carried out at C5-6 under gross observation.  The  operating microscope was then brought in and microdissection technique used  to dissect the anterior epidural space, removing remaining disk and created  foraminotomy over the C6 nerve roots bilaterally.  Following completion of  the decompression and careful exploration of the nerve root, the wound was  irrigated and hemostasis assured. A 7 mm bone graft was fashioned from  patellar allograft and tapped into place.  A 16 mm tether plate was then  placed with two screws in C5 and two in C6.  Intraoperative x-ray showed  good placement of bone graft, plate and screws.  Wounds were once again  irrigated.  Hemostasis assured.  The platysma was reapproximated with 0  Vicryl interrupted fashion.  Subcutaneous tissue reapproximated in 3-0  Vicryl interrupted fashion.  Skin was closed with 4-0 Vicryl in running  subcuticular fashion.  Benzoin and Steri-Strips were placed, as well as  occlusive Telfa and Op-Site.  The patient was placed in Aspen collar,  returned to the recovery room in good condition.  Payton Doughty, M.D.    MWR/MEDQ  D:  08/11/2002  T:  08/12/2002  Job:  [id number]

## 2010-09-26 NOTE — H&P (Signed)
Amy Bolton, Amy Bolton                ACCOUNT NO.:  [id number]   MEDICAL RECORD NO.:  [id number]          PATIENT TYPE:  INP   LOCATION:  1613                         FACILITY:  [facility]   PHYSICIAN:  [name], MD       DATE OF BIRTH:  10-Feb-1928   DATE OF ADMISSION:  07/24/2005  DATE OF DISCHARGE:  07/26/2005                                HISTORY & PHYSICAL   CHIEF COMPLAINT:  Nausea, vomiting, diarrhea, dehydration, failure to  thrive, and weakness.   HISTORY OF PRESENT ILLNESS:  This is a 77 year old female, sick since  Tuesday morning with nausea, vomiting, diarrhea, abdominal pain.  She went  to the emergency room in the afternoon and was diagnosed with  gastroenteritis.  Labs were negative.  She was given IV fluids, Zofran,  Phenergan and Imodium.  She was sent home with a Phenergan.  She stayed in  bed Wednesday and Thursday.  She felt a little bit better Wednesday and  started getting sicker on Thursday, and then last night she got very ill  again which she describes as deathly sick.  She vomited four times over  night and into today and she had diarrhea about nine times overnight.  The  last time getting sick was at 1 p.m. this afternoon.  She denies any  hematemesis, hematochezia.  The vomit is thick and is like slime.  She is  having some dry heaves.  The abdominal pain is described as a knot just  right in the umbilicus.  The pain is diffuse.  Her throat burns like  indigestion.  Now she is just tired. She is able to urinate some but she has  been pushing Pedialyte, pushing fluids, had a potato for lunch.  I admitted  her directly for intractable symptoms, dehydration and for evaluation and  treatment.   PAST MEDICAL HISTORY:  1.  Hypertension.  2.  Hyperlipidemia.  3.  Anxiety.  4.  GERD.  5.  Hiatal hernia.  6.  Non-obstructive coronary artery disease.  7.  Stress incontinence.  8.  Allergic rhinitis.  9.  Osteoarthritis.  10. Status post lumbar and cervical  laminectomies.  11. Tonsillectomy and adenoidectomy.  12. Total abdominal hysterectomy.  13. History of cholecystectomy.  14. Bladder tack operation.  15. Bilateral rotator cuff surgery.   ALLERGIES:  1.  PENICILLIN.  2.  NORVASC.   MEDICATIONS:  1.  Atacand 16 mg daily.  2.  Toprol-XL 50 mg daily.  3.  Clonazepam 1 mg one-half to one b.i.d.  4.  Tylenol No. 3 as needed.  5.  Protonix 40 mg daily.  6.  Allegra 180 mg daily p.r.n.  7.  Flonase nasal spray p.r.n.  8.  Wellbutrin XL 150 mg daily.   SOCIAL HISTORY:  She is married. She is retired.  No tobacco or alcohol.   FAMILY HISTORY:  COPD, Alzheimer's, and colon cancer.   REVIEW OF SYSTEMS:  .  Otherwise negative except for what is in the HPI.  She denies any chest pain, shortness of breath, ENT symptoms.  Her main  issue is the nausea, vomiting, dehydration, diarrhea, failure to thrive,  weakness, and fatigue.  All other organ system review negative.   PHYSICAL EXAMINATION:  VITAL SIGNS:  Temperature 97.9, heart rate 77,  respiratory rate 18, blood pressure between 134/78 and 171/88, saturation  92% on room air.  GENERAL:  Alert and oriented x3.  She appears ill, in no acute distress.  HEENT:  Oropharynx is dry.  NECK:  No JVD.  PULMONARY:  Clear to auscultation bilaterally.  CARDIAC:  Regular.  ABDOMEN:  Soft.  Cholecystectomy scar.  Total abdominal hysterectomy scar.  Tenderness to deep palpation.  Otherwise bowel sounds are slightly  hyperactive.  No rebound, no guarding.  EXTREMITIES:  No clubbing or cyanosis or edema noted.   LABORATORY DATA:  Labs from July 21, 2005.  Urinalysis negative.  Lipase  19, sodium 140, potassium 4.3, chloride 112, bicarb 22, BUN 27, creatinine  1.  White count 9.3, hemoglobin 13.7, platelet count 228.   ASSESSMENT:  1.  Elderly female with severe viral gastroenteritis, question      Norovirus/[place] virus with symptoms including intractable nausea,      vomiting, diarrhea,  abdominal pain, and dehydration.  2.  Intravenous fluids.  3.  Phenergan.  4.  Protonix IV.  5.  Bowel rest.  6.  Imodium as needed.  7.  Repeat labs.  Check a KUB to see if anything else is going on.   PLAN:  1.  Differential diagnosis was reviewed with the patient and includes      ischemic colitis, diverticulitis, as well as a host of other diagnoses      that were reviewed.  The patient has more of a viral illness at this      point.  Will wait to      see what the labs and know what else is going on.  2.  Will hold all of her oral medications at this point and slowly put them      back on.  3.  Will control blood pressure as able.      [name], MD  Electronically Signed     JMR/MEDQ  D:  07/24/2005  T:  07/26/2005  Job:  [id number]   cc:   Harvie Junior, M.D.  Fax: [phone]   Payton Doughty, M.D.  Fax: [phone]   Richard A. Alanda Amass, M.D.  Fax: [phone]   Tilda Burrow, M.D.  Fax: [phone]

## 2011-02-02 LAB — COMPREHENSIVE METABOLIC PANEL
ALT: 19
AST: 26
Alkaline Phosphatase: 65
CO2: 27
Chloride: 105
GFR calc Af Amer: 56 — ABNORMAL LOW
GFR calc non Af Amer: 46 — ABNORMAL LOW
Glucose, Bld: 111 — ABNORMAL HIGH
Sodium: 142
Total Bilirubin: 0.8

## 2011-02-02 LAB — LIPASE, BLOOD: Lipase: 14

## 2011-02-02 LAB — DIFFERENTIAL
Basophils Absolute: 0
Basophils Relative: 0
Eosinophils Absolute: 0.1
Eosinophils Relative: 2

## 2011-02-02 LAB — URINE MICROSCOPIC-ADD ON

## 2011-02-02 LAB — URINALYSIS, ROUTINE W REFLEX MICROSCOPIC
Bilirubin Urine: NEGATIVE
Glucose, UA: NEGATIVE
Hgb urine dipstick: NEGATIVE
Protein, ur: NEGATIVE
Specific Gravity, Urine: 1.01

## 2011-02-02 LAB — CBC
Hemoglobin: 14.1
RBC: 4.51
WBC: 8.9

## 2011-02-11 ENCOUNTER — Ambulatory Visit: Payer: Self-pay | Admitting: Physical Medicine and Rehabilitation

## 2011-07-17 ENCOUNTER — Encounter: Payer: Self-pay | Admitting: *Deleted

## 2011-07-30 ENCOUNTER — Encounter: Payer: Self-pay | Admitting: *Deleted

## 2011-08-04 ENCOUNTER — Encounter: Payer: Self-pay | Admitting: Cardiovascular Disease

## 2011-08-04 ENCOUNTER — Ambulatory Visit (INDEPENDENT_AMBULATORY_CARE_PROVIDER_SITE_OTHER): Payer: Medicare Other | Admitting: Cardiovascular Disease

## 2011-08-04 VITALS — BP 163/81 | HR 76 | Ht 63.0 in | Wt 180.0 lb

## 2011-08-04 DIAGNOSIS — I5189 Other ill-defined heart diseases: Secondary | ICD-10-CM | POA: Insufficient documentation

## 2011-08-04 DIAGNOSIS — I10 Essential (primary) hypertension: Secondary | ICD-10-CM

## 2011-08-04 DIAGNOSIS — I519 Heart disease, unspecified: Secondary | ICD-10-CM

## 2011-08-04 MED ORDER — CARVEDILOL 25 MG PO TABS: 25.0000 mg | ORAL_TABLET | Freq: Two times a day (BID) | ORAL | Status: DC

## 2011-08-04 NOTE — Progress Notes (Signed)
Amy Bolton Date of Birth  01-02-1928 [facility]     [place]  [address]    [address]   [address] [place], [place]  [address]    [place], [place]  [address] [phone]  Fax  [phone]  [phone]  Fax [phone]  Problem list: 1. Aortic stenosis 2.Diastolic dysfunction 3. Renal insufficiency 4. Hyperlipidemia-intolerant to statin therapy 5. Minimal coronary artery disease by cardiac cath 2007 6 Hypertension 7. Chronic back pain  History of Present Illness:  Amy Bolton is a 78 y.o. female with hx of diastolic dysfunction, mild aortic stenosis, and mild CAD.  She walks regularly.  She has had some bronchitis for the past several months.    Current Outpatient Prescriptions on File Prior to Visit  Medication Sig Dispense Refill  . ALPRAZolam (XANAX) 0.25 MG tablet Take 0.25 mg by mouth at bedtime as needed.      . carvedilol (COREG) 12.5 MG tablet Take 12.5 mg by mouth 2 (two) times daily with a meal.      . cholecalciferol (VITAMIN D) 1000 UNITS tablet Take 2,000 Units by mouth daily.       . valsartan (DIOVAN) 160 MG tablet Take 80 mg by mouth daily.         Allergies  Allergen Reactions  . Ampicillin   . Penicillins     Past Medical History  Diagnosis Date  . Atypical chest pain   . Palpitation   . SOB (shortness of breath)     Episodic  . Hypertensive urgency     Episodic  . HTN (hypertension)   . Asthma     Underlying  . Kidney disease     Chronic, stage 3, with creatinine clearance estimated to be about 40  . Anxiety     Chronic  . Constipation   . GERD (gastroesophageal reflux disease)   . Hiatal hernia     Hx of  . Hyperlipidemia   . Osteoarthritis     Past Surgical History  Procedure Date  . US echocardiography 10-12-08    2[facility]D done at [facility] on October 12, 2008 showed some diastolic dysfunction, EF 55%, mild tricuspid regurgitation, right ventricular systolic pressure elevated a 40-50 mmHg,  mild valvular aortic stenosis noted.  . Cardiovascular stress test 04-19-2008    showing EF 69% with otherwise a normal stress nuclear study  . Tonsillectomy   . Adenoidectomy   . Spine surgery     2001, lumbar at the L4-5 level; in 2004, C-spine operation with fusion at the  C5-6 level; in 2004, left thumb CMC joint operation; in 2005 bilateral rotator cuff surgeries  . Bladder suspension     surgery  . Cardiac catheterization 01-2006    showing minimal proximal left anterior descending artery disease. showing less than 50% stenosis of the proximal LAD and otherwise normal coronary arteries,  . Back surgery     x2    History  Smoking status  . Former Smoker  Smokeless tobacco  . Not on file  Comment: Quit 46yrs    History  Alcohol Use: Not on file    No family history on file.  Reviw of Systems:  Reviewed in the HPI.  All other systems are negative.  Physical Exam: Blood pressure 163/81, pulse 76, height 5\' 3"  (1.6 m), weight 180 lb (81.647 kg). General: Well developed, well nourished, in no acute distress.  Head: Normocephalic, atraumatic, sclera non-icteric, mucus membranes are moist,  Neck: Supple. Carotids are 2 + with radiation of her systolic murmur into her carotid arteries.  No JVD  Lungs: Clear bilaterally to auscultation.  Heart: regular rate.  normal  S1 S2. There is a 1-2/6 systolic murmur at the left sternal border.  Abdomen: Soft, non-tender, non-distended with normal bowel sounds. No hepatomegaly. No rebound/guarding. No masses.  Msk:  Strength and tone are normal  Extremities: No clubbing or cyanosis. No edema.  Distal pedal pulses are 2+ and equal bilaterally.  Neuro: Alert and oriented X 3. Moves all extremities spontaneously.  Psych:  Responds to questions appropriately with a normal affect.  ECG: August 04, 2011. Normal sinus rhythm. She has nonspecific ST and T wave abnormalities.  Assessment / Plan:

## 2011-08-04 NOTE — Assessment & Plan Note (Signed)
We'll increase her Coreg to 25 mg twice a day.

## 2011-08-04 NOTE — Patient Instructions (Signed)
Your physician recommends that you schedule a follow-up appointment in: 3 MONTHS  Your physician has recommended you make the following change in your medication:   INCREASE COREG TO 25 MG DAILY

## 2011-08-04 NOTE — Assessment & Plan Note (Signed)
A mild hypertrophic cardiopathy with near obliteration of the left ventricle cavity by echo. Chest systolic anterior motion of the mitral valve leaflet. She also has mild aortic stenosis.  We'll continue with her medicines. Will increase her carvedilol to 25 mg twice a day which should help with these issues. We'll see her in 3 months.

## 2011-08-17 ENCOUNTER — Telehealth: Payer: Self-pay | Admitting: Cardiovascular Disease

## 2011-08-17 DIAGNOSIS — I5189 Other ill-defined heart diseases: Secondary | ICD-10-CM

## 2011-08-17 NOTE — Telephone Encounter (Signed)
Pt willing to proceed with echo, Dr Elease Hashimoto ordered. Pt told to continue to watch sodium, elevate legs and where compression hose, pt verbalized understanding.

## 2011-08-17 NOTE — Telephone Encounter (Signed)
The increased dose of coreg should actually help her diastolic dysfunction and therefore any associated leg swelling.  This swelling may be due to venous insufficiency.    seggest leg elevation. Compression hose. ? Repeat echo if we dont have a recent one

## 2011-08-17 NOTE — Telephone Encounter (Signed)
C/o of ankle/feet edema. By evening the swelling goes up legs and skin feels tight, states she does elevate throughout the day and is careful of salt. Would you want to adjust Coreg/ this is a new med for her and new symptoms. Please advise.

## 2011-08-17 NOTE — Telephone Encounter (Signed)
New problem:  Patient calling C/O both  feet swelling, would this be cause by the increase of medication- coreg

## 2011-08-21 NOTE — Telephone Encounter (Signed)
Echo scheduled for 09/15/11 @ 12:30 at the [place] office.sl

## 2011-09-15 ENCOUNTER — Other Ambulatory Visit (INDEPENDENT_AMBULATORY_CARE_PROVIDER_SITE_OTHER): Payer: Medicare Other

## 2011-09-15 DIAGNOSIS — I359 Nonrheumatic aortic valve disorder, unspecified: Secondary | ICD-10-CM

## 2011-09-15 DIAGNOSIS — I519 Heart disease, unspecified: Secondary | ICD-10-CM

## 2011-09-15 DIAGNOSIS — I5189 Other ill-defined heart diseases: Secondary | ICD-10-CM

## 2011-11-05 ENCOUNTER — Ambulatory Visit (INDEPENDENT_AMBULATORY_CARE_PROVIDER_SITE_OTHER): Payer: Medicare Other | Admitting: Cardiovascular Disease

## 2011-11-05 ENCOUNTER — Encounter: Payer: Self-pay | Admitting: Cardiovascular Disease

## 2011-11-05 VITALS — BP 113/68 | HR 64 | Ht 63.0 in | Wt 178.4 lb

## 2011-11-05 DIAGNOSIS — I5189 Other ill-defined heart diseases: Secondary | ICD-10-CM

## 2011-11-05 DIAGNOSIS — I10 Essential (primary) hypertension: Secondary | ICD-10-CM

## 2011-11-05 DIAGNOSIS — I519 Heart disease, unspecified: Secondary | ICD-10-CM

## 2011-11-05 NOTE — Progress Notes (Signed)
Amy Bolton Date of Birth  10-12-1927 [facility]     [place]  [address]    [address]   [address] [place], [place]  [address]    [place], [place]  [address] [phone]  Fax  [phone]  [phone]  Fax [phone]  Problem list: 1. Aortic stenosis - mild 2. Diastolic dysfunction 3. Renal insufficiency 4. Hyperlipidemia-intolerant to statin therapy 5. Minimal coronary artery disease by cardiac cath 2007 6 Hypertension 7. Chronic back pain  History of Present Illness:  Amy Bolton is a 78 y.o. female with hx of diastolic dysfunction, mild aortic stenosis, and mild CAD.  She walks regularly.    She fell and injured her leg several months ago. She is scheduled have an an MRI of her leg and back later today.  Current Outpatient Prescriptions on File Prior to Visit  Medication Sig Dispense Refill  . ALPRAZolam (XANAX) 0.25 MG tablet Take 0.25 mg by mouth at bedtime as needed.      . carvedilol (COREG) 25 MG tablet Take 1 tablet (25 mg total) by mouth 2 (two) times daily with a meal.  60 tablet  3  . cholecalciferol (VITAMIN D) 1000 UNITS tablet Take 2,000 Units by mouth daily.       Marland Kitchen HYDROcodone-acetaminophen (VICODIN) 5-500 MG per tablet Take 1 tablet by mouth as needed.      Marland Kitchen leucovorin (WELLCOVORIN) 25 MG tablet Take 25 mg by mouth 2 (two) times daily.      . valsartan (DIOVAN) 160 MG tablet Take 160 mg by mouth daily.         Allergies  Allergen Reactions  . Ampicillin   . Penicillins     Past Medical History  Diagnosis Date  . Atypical chest pain   . Palpitation   . SOB (shortness of breath)     Episodic  . Hypertensive urgency     Episodic  . HTN (hypertension)   . Asthma     Underlying  . Kidney disease     Chronic, stage 3, with creatinine clearance estimated to be about 40  . Anxiety     Chronic  . Constipation   . GERD (gastroesophageal reflux disease)   . Hiatal hernia     Hx of  . Hyperlipidemia   . Osteoarthritis      Past Surgical History  Procedure Date  . US echocardiography 10-12-08    2-D done at [facility] on October 12, 2008 showed some diastolic dysfunction, EF 55%, mild tricuspid regurgitation, right ventricular systolic pressure elevated a 40-50 mmHg, mild valvular aortic stenosis noted.  . Cardiovascular stress test 04-19-2008    showing EF 69% with otherwise a normal stress nuclear study  . Tonsillectomy   . Adenoidectomy   . Spine surgery     2001, lumbar at the L4-5 level; in 2004, C-spine operation with fusion at the  C5-6 level; in 2004, left thumb CMC joint operation; in 2005 bilateral rotator cuff surgeries  . Bladder suspension     surgery  . Cardiac catheterization 01-2006    showing minimal proximal left anterior descending artery disease. showing less than 50% stenosis of the proximal LAD and otherwise normal coronary arteries,  . Back surgery     x2    History  Smoking status  . Former Smoker  Smokeless tobacco  . Not on file  Comment: Quit 41yrs    History  Alcohol Use: Not on file  No family history on file.  Reviw of Systems:  Reviewed in the HPI.  All other systems are negative.  Physical Exam: Blood pressure 113/68, pulse 64, height 5\' 3"  (1.6 m), weight 178 lb 6.4 oz (80.922 kg). General: Well developed, well nourished, in no acute distress.  Head: Normocephalic, atraumatic, sclera non-icteric, mucus membranes are moist,   Neck: Supple. Carotids are 2 + with radiation of her systolic murmur into her carotid arteries.  No JVD  Lungs: Clear bilaterally to auscultation.  Heart: regular rate.  normal  S1 S2. There is a 1-2/6 systolic murmur at the left sternal border.  Abdomen: Soft, non-tender, non-distended with normal bowel sounds. No hepatomegaly. No rebound/guarding. No masses.  Msk:  Strength and tone are normal  Extremities: No clubbing or cyanosis. No edema.  Distal pedal pulses are 2+ and equal bilaterally.  Neuro: Alert  and oriented X 3. Moves all extremities spontaneously.  Psych:  Responds to questions appropriately with a normal affect.  ECG: August 04, 2011. Normal sinus rhythm. She has nonspecific ST and T wave abnormalities.  Assessment / Plan:

## 2011-11-05 NOTE — Assessment & Plan Note (Signed)
Amy Bolton is doing fairly well. She recently increased her Diovan up to 160 mg a day. She's not had any episodes of chest pain or shortness of breath.  She'll continue with her current medications. She again in 6 months.

## 2011-11-05 NOTE — Assessment & Plan Note (Signed)
Stable

## 2011-11-05 NOTE — Patient Instructions (Addendum)
Your physician wants you to follow-up in: 6 months You will receive a reminder letter in the mail two months in advance. If you don't receive a letter, please call our office to schedule the follow-up appointment.   Please bring copy of last cholesterol results.

## 2011-11-25 ENCOUNTER — Other Ambulatory Visit: Payer: Self-pay | Admitting: Orthopedic Surgery

## 2011-11-25 DIAGNOSIS — M25559 Pain in unspecified hip: Secondary | ICD-10-CM

## 2011-11-26 ENCOUNTER — Ambulatory Visit
Admission: RE | Admit: 2011-11-26 | Discharge: 2011-11-26 | Disposition: A | Discharge status: Home / Self Care | Payer: Medicare Other | Source: Ambulatory Visit | Attending: Orthopedic Surgery | Admitting: Orthopedic Surgery

## 2011-11-26 DIAGNOSIS — M25559 Pain in unspecified hip: Secondary | ICD-10-CM

## 2011-12-08 ENCOUNTER — Inpatient Hospital Stay (HOSPITAL_COMMUNITY)
Admission: EM | Admit: 2011-12-08 | Discharge: 2011-12-11 | DRG: 292 | Disposition: A | Discharge status: Skilled Nursing Facility | Payer: Medicare Other | Attending: Internal Medicine | Admitting: Internal Medicine

## 2011-12-08 ENCOUNTER — Emergency Department (HOSPITAL_COMMUNITY): Payer: Medicare Other

## 2011-12-08 ENCOUNTER — Encounter (HOSPITAL_COMMUNITY): Payer: Self-pay | Admitting: Emergency Medicine

## 2011-12-08 DIAGNOSIS — M1990 Unspecified osteoarthritis, unspecified site: Secondary | ICD-10-CM | POA: Diagnosis present

## 2011-12-08 DIAGNOSIS — I509 Heart failure, unspecified: Secondary | ICD-10-CM | POA: Diagnosis present

## 2011-12-08 DIAGNOSIS — J45909 Unspecified asthma, uncomplicated: Secondary | ICD-10-CM | POA: Diagnosis present

## 2011-12-08 DIAGNOSIS — M25559 Pain in unspecified hip: Secondary | ICD-10-CM | POA: Diagnosis present

## 2011-12-08 DIAGNOSIS — N393 Stress incontinence (female) (male): Secondary | ICD-10-CM | POA: Diagnosis present

## 2011-12-08 DIAGNOSIS — N183 Chronic kidney disease, stage 3 unspecified: Secondary | ICD-10-CM | POA: Diagnosis present

## 2011-12-08 DIAGNOSIS — I517 Cardiomegaly: Secondary | ICD-10-CM | POA: Diagnosis present

## 2011-12-08 DIAGNOSIS — E8770 Fluid overload, unspecified: Secondary | ICD-10-CM | POA: Diagnosis present

## 2011-12-08 DIAGNOSIS — M810 Age-related osteoporosis without current pathological fracture: Secondary | ICD-10-CM | POA: Diagnosis present

## 2011-12-08 DIAGNOSIS — F411 Generalized anxiety disorder: Secondary | ICD-10-CM | POA: Diagnosis present

## 2011-12-08 DIAGNOSIS — I350 Nonrheumatic aortic (valve) stenosis: Secondary | ICD-10-CM | POA: Diagnosis present

## 2011-12-08 DIAGNOSIS — R627 Adult failure to thrive: Secondary | ICD-10-CM | POA: Diagnosis present

## 2011-12-08 DIAGNOSIS — N2581 Secondary hyperparathyroidism of renal origin: Secondary | ICD-10-CM | POA: Diagnosis present

## 2011-12-08 DIAGNOSIS — I10 Essential (primary) hypertension: Secondary | ICD-10-CM | POA: Diagnosis present

## 2011-12-08 DIAGNOSIS — I129 Hypertensive chronic kidney disease with stage 1 through stage 4 chronic kidney disease, or unspecified chronic kidney disease: Secondary | ICD-10-CM | POA: Diagnosis present

## 2011-12-08 DIAGNOSIS — G4700 Insomnia, unspecified: Secondary | ICD-10-CM | POA: Diagnosis present

## 2011-12-08 DIAGNOSIS — R5383 Other fatigue: Secondary | ICD-10-CM

## 2011-12-08 DIAGNOSIS — K5900 Constipation, unspecified: Secondary | ICD-10-CM | POA: Diagnosis present

## 2011-12-08 DIAGNOSIS — I2510 Atherosclerotic heart disease of native coronary artery without angina pectoris: Secondary | ICD-10-CM | POA: Diagnosis present

## 2011-12-08 DIAGNOSIS — Z8701 Personal history of pneumonia (recurrent): Secondary | ICD-10-CM

## 2011-12-08 DIAGNOSIS — K219 Gastro-esophageal reflux disease without esophagitis: Secondary | ICD-10-CM | POA: Diagnosis present

## 2011-12-08 DIAGNOSIS — I359 Nonrheumatic aortic valve disorder, unspecified: Secondary | ICD-10-CM | POA: Diagnosis present

## 2011-12-08 DIAGNOSIS — R5381 Other malaise: Secondary | ICD-10-CM | POA: Diagnosis present

## 2011-12-08 DIAGNOSIS — R21 Rash and other nonspecific skin eruption: Secondary | ICD-10-CM | POA: Diagnosis present

## 2011-12-08 DIAGNOSIS — Z6831 Body mass index (BMI) 31.0-31.9, adult: Secondary | ICD-10-CM

## 2011-12-08 DIAGNOSIS — E785 Hyperlipidemia, unspecified: Secondary | ICD-10-CM | POA: Diagnosis present

## 2011-12-08 DIAGNOSIS — I5030 Unspecified diastolic (congestive) heart failure: Principal | ICD-10-CM | POA: Diagnosis present

## 2011-12-08 DIAGNOSIS — I5189 Other ill-defined heart diseases: Secondary | ICD-10-CM | POA: Diagnosis present

## 2011-12-08 DIAGNOSIS — E669 Obesity, unspecified: Secondary | ICD-10-CM | POA: Diagnosis present

## 2011-12-08 DIAGNOSIS — Z79899 Other long term (current) drug therapy: Secondary | ICD-10-CM

## 2011-12-08 LAB — POCT I-STAT, CHEM 8
Chloride: 108 mEq/L (ref 96–112)
Creatinine, Ser: 1.1 mg/dL (ref 0.50–1.10)
Glucose, Bld: 129 mg/dL — ABNORMAL HIGH (ref 70–99)
Potassium: 4.5 mEq/L (ref 3.5–5.1)

## 2011-12-08 LAB — CBC
HCT: 40 % (ref 36.0–46.0)
MCV: 93.5 fL (ref 78.0–100.0)
RDW: 14.5 % (ref 11.5–15.5)
WBC: 11.6 10*3/uL — ABNORMAL HIGH (ref 4.0–10.5)

## 2011-12-08 NOTE — ED Provider Notes (Signed)
History     CSN: [id number]  Arrival date & time 12/08/11  1931   First MD Initiated Contact with Patient 12/08/11 2327      Chief Complaint  Patient presents with  . Fatigue    (Consider location/radiation/quality/duration/timing/severity/associated sxs/prior treatment) HPI Comments: 78 y/o female presents with increasing fatigue over the past month. States her whole body feels weak and it is starting to get hard for her to get around her house with her walker and cane. Admits to SOB on exertion only which started in the past 2 weeks. Family states they have never seen her that short of breath in the past. Has a hx of renal failure and her nephrologist recently put her on 3 days of lasix due to increased leg edema. Last lasix dose was 3 days ago. States edema has subsided. She had SOB on exertion even with lasix. No other medication changes. Also saw cardiologist a couple weeks ago with no medication changes. Has a hx of aortic stenosis. Denies any chest pain, fever, abdominal pain, confusion, lightheadedness, syncope, nausea, dysuria, increased frequency or urgency. Patient did have a fall back in May where she tore a ligament in her hip. Had 3 cortisone injections to manage her pain.  The history is provided by the patient and a relative.    Past Medical History  Diagnosis Date  . Atypical chest pain   . Palpitation   . SOB (shortness of breath)     Episodic  . Hypertensive urgency     Episodic  . HTN (hypertension)   . Asthma     Underlying  . Kidney disease     Chronic, stage 3, with creatinine clearance estimated to be about 40  . Anxiety     Chronic  . Constipation   . GERD (gastroesophageal reflux disease)   . Hiatal hernia     Hx of  . Hyperlipidemia   . Osteoarthritis     Past Surgical History  Procedure Date  . US echocardiography 10-12-08    2-D done at [facility] on October 12, 2008 showed some diastolic dysfunction, EF 55%, mild tricuspid  regurgitation, right ventricular systolic pressure elevated a 40-50 mmHg, mild valvular aortic stenosis noted.  . Cardiovascular stress test 04-19-2008    showing EF 69% with otherwise a normal stress nuclear study  . Tonsillectomy   . Adenoidectomy   . Spine surgery     2001, lumbar at the L4-5 level; in 2004, C-spine operation with fusion at the  C5-6 level; in 2004, left thumb CMC joint operation; in 2005 bilateral rotator cuff surgeries  . Bladder suspension     surgery  . Cardiac catheterization 01-2006    showing minimal proximal left anterior descending artery disease. showing less than 50% stenosis of the proximal LAD and otherwise normal coronary arteries,  . Back surgery     x2    No family history on file.  History  Substance Use Topics  . Smoking status: Never Smoker   . Smokeless tobacco: Not on file   Comment: Quit 27yrs  . Alcohol Use: No    OB History    Grav Para Term Preterm Abortions TAB SAB Ect Mult Living                  Review of Systems  Constitutional: Positive for fatigue. Negative for fever and chills.  Respiratory: Positive for shortness of breath.   Cardiovascular: Negative for chest pain. Leg swelling: subsided.  Gastrointestinal: Negative for nausea and abdominal pain.  Genitourinary: Negative for dysuria, urgency and frequency.  Neurological: Positive for weakness. Negative for syncope and light-headedness.  Psychiatric/Behavioral: Negative for confusion.    Allergies  Ampicillin and Penicillins  Home Medications   Current Outpatient Rx  Name Route Sig Dispense Refill  . ALPRAZOLAM 0.25 MG PO TABS Oral Take 0.25 mg by mouth at bedtime as needed. For anxiety    . CARVEDILOL 25 MG PO TABS Oral Take 1 tablet (25 mg total) by mouth 2 (two) times daily with a meal. 60 tablet 3  . VITAMIN D 1000 UNITS PO TABS Oral Take 2,000 Units by mouth daily.     . COLESEVELAM HCL 625 MG PO TABS Oral Take 1,250 mg by mouth 2 (two) times daily with a  meal.    . MIRTAZAPINE 30 MG PO TABS Oral Take 30 mg by mouth at bedtime.    Marland Kitchen LISINOPRIL 40 MG PO TABS Oral Take 40 mg by mouth daily.    Marland Kitchen VALSARTAN 160 MG PO TABS Oral Take 160 mg by mouth daily.       BP 145/69  Pulse 68  Temp 99.1 F (37.3 C) (Oral)  Resp 22  SpO2 95%  Physical Exam  Nursing note and vitals reviewed. Constitutional: She is oriented to person, place, and time. She appears well-developed and well-nourished. No distress.  HENT:  Head: Normocephalic and atraumatic.  Mouth/Throat: Oropharynx is clear and moist.  Eyes: Conjunctivae and EOM are normal. Pupils are equal, round, and reactive to light.  Neck: Neck supple.  Cardiovascular: Normal rate, regular rhythm and intact distal pulses.   Murmur heard.  Systolic murmur is present with a grade of 5/6       Systolic ejection murmur at LSB. No carotid bruit. Trace pitting edeme LE b/l  Pulmonary/Chest: No accessory muscle usage. She has rales in the right middle field, the right lower field, the left middle field and the left lower field.  Abdominal: Soft. Bowel sounds are normal. There is no tenderness.  Neurological: She is alert and oriented to person, place, and time. No cranial nerve deficit or sensory deficit.       Decreased strength due to fatigue  Skin: Skin is warm and dry. No rash noted.  Psychiatric: She has a normal mood and affect. Her behavior is normal.    ED Course  Procedures (including critical care time)  Labs Reviewed  CBC - Abnormal; Notable for the following:    WBC 11.6 (*)     All other components within normal limits  POCT I-STAT, CHEM 8 - Abnormal; Notable for the following:    BUN 35 (*)     Glucose, Bld 129 (*)     All other components within normal limits  URINALYSIS, ROUTINE W REFLEX MICROSCOPIC   Results for orders placed during the hospital encounter of 12/08/11  CBC      Component Value Range   WBC 11.6 (*) 4.0 - 10.5 K/uL   RBC 4.28  3.87 - 5.11 MIL/uL   Hemoglobin  13.5  12.0 - 15.0 g/dL   HCT 96.2  95.2 - 84.1 %   MCV 93.5  78.0 - 100.0 fL   MCH 31.5  26.0 - 34.0 pg   MCHC 33.8  30.0 - 36.0 g/dL   RDW 32.4  40.1 - 02.7 %   Platelets 197  150 - 400 K/uL  URINALYSIS, ROUTINE W REFLEX MICROSCOPIC      Component Value  Range   Color, Urine YELLOW  YELLOW   APPearance CLEAR  CLEAR   Specific Gravity, Urine 1.015  1.005 - 1.030   pH 5.0  5.0 - 8.0   Glucose, UA NEGATIVE  NEGATIVE mg/dL   Hgb urine dipstick NEGATIVE  NEGATIVE   Bilirubin Urine NEGATIVE  NEGATIVE   Ketones, ur NEGATIVE  NEGATIVE mg/dL   Protein, ur NEGATIVE  NEGATIVE mg/dL   Urobilinogen, UA 0.2  0.0 - 1.0 mg/dL   Nitrite NEGATIVE  NEGATIVE   Leukocytes, UA TRACE (*) NEGATIVE  POCT I-STAT, CHEM 8      Component Value Range   Sodium 137  135 - 145 mEq/L   Potassium 4.5  3.5 - 5.1 mEq/L   Chloride 108  96 - 112 mEq/L   BUN 35 (*) 6 - 23 mg/dL   Creatinine, Ser 1.61  0.50 - 1.10 mg/dL   Glucose, Bld 096 (*) 70 - 99 mg/dL   Calcium, Ion 0.45  4.09 - 1.30 mmol/L   TCO2 20  0 - 100 mmol/L   Hemoglobin 13.9  12.0 - 15.0 g/dL   HCT 81.1  91.4 - 78.2 %  PRO B NATRIURETIC PEPTIDE      Component Value Range   Pro B Natriuretic peptide (BNP) 745.7 (*) 0 - 450 pg/mL  POCT I-STAT TROPONIN I      Component Value Range   Troponin i, poc 0.02  0.00 - 0.08 ng/mL   Comment 3           URINE MICROSCOPIC-ADD ON      Component Value Range   Squamous Epithelial / LPF RARE  RARE   WBC, UA 3-6  <3 WBC/hpf   Bacteria, UA RARE  RARE    Dg Chest 2 View  12/08/2011  *RADIOLOGY REPORT*  Clinical Data: Chest pain and shortness of breath.  CHEST - 2 VIEW  Comparison: Chest x-ray 11/16/2009.  Findings: Lung volumes are normal.  No consolidative airspace disease.  No pleural effusions.  No pneumothorax.  No pulmonary nodule or mass noted.  Pulmonary vasculature and the cardiomediastinal silhouette are within normal limits. Atherosclerotic calcifications of the thoracic aorta.  Orthopedic fixation  hardware in the lower cervical spine incidentally noted.  IMPRESSION: 1. No radiographic evidence of acute cardiopulmonary disease. 2.  Atherosclerosis.  Original Report Authenticated By: Florencia Reasons, M.D.    Date: 12/09/2011  Rate: 73  Rhythm: normal sinus rhythm with occasional PVC  QRS Axis: normal   Intervals: normal  ST/T Wave abnormalities: nonspecific T wave changes  Conduction Disutrbances:none  Narrative Interpretation: no STEMI  Old EKG Reviewed: changes noted occasional PVC not present on previous ECG     No diagnosis found.    MDM  CXR normal. Rales heard on exam. No head CT at this time due to patient AAOx3 and outstanding neurological exam. Await labs. Possible worsening of AS. 1:09 AM Elevated BNP with history of diastolic heart failure. Will give IV lasix and consult her PCP. Discussed admission with patient and family who are comfortable with this plan of action. 2:08 AM Dr. Eber Hong spoke with internal medicine who will admit patient.  Trevor Mace, PA-C 12/09/11 0330

## 2011-12-08 NOTE — ED Notes (Signed)
Pt's daughter st's pt has been getting weaker over the past 2 weeks.  Pt c/o feeling tired. Pt denies any pain, no nausea or vomiting.  Pt st's she get's short of breath when trying to walk

## 2011-12-09 DIAGNOSIS — R21 Rash and other nonspecific skin eruption: Secondary | ICD-10-CM | POA: Diagnosis present

## 2011-12-09 DIAGNOSIS — I350 Nonrheumatic aortic (valve) stenosis: Secondary | ICD-10-CM | POA: Diagnosis present

## 2011-12-09 DIAGNOSIS — I2510 Atherosclerotic heart disease of native coronary artery without angina pectoris: Secondary | ICD-10-CM | POA: Diagnosis present

## 2011-12-09 DIAGNOSIS — M25559 Pain in unspecified hip: Secondary | ICD-10-CM | POA: Diagnosis present

## 2011-12-09 DIAGNOSIS — E785 Hyperlipidemia, unspecified: Secondary | ICD-10-CM | POA: Diagnosis present

## 2011-12-09 DIAGNOSIS — R627 Adult failure to thrive: Secondary | ICD-10-CM | POA: Diagnosis present

## 2011-12-09 DIAGNOSIS — E669 Obesity, unspecified: Secondary | ICD-10-CM | POA: Diagnosis present

## 2011-12-09 DIAGNOSIS — E8770 Fluid overload, unspecified: Secondary | ICD-10-CM | POA: Diagnosis present

## 2011-12-09 DIAGNOSIS — I517 Cardiomegaly: Secondary | ICD-10-CM | POA: Diagnosis present

## 2011-12-09 LAB — URINE MICROSCOPIC-ADD ON

## 2011-12-09 LAB — URINALYSIS, ROUTINE W REFLEX MICROSCOPIC
Bilirubin Urine: NEGATIVE
Hgb urine dipstick: NEGATIVE
Specific Gravity, Urine: 1.015 (ref 1.005–1.030)
Urobilinogen, UA: 0.2 mg/dL (ref 0.0–1.0)

## 2011-12-09 LAB — TSH: TSH: 0.636 u[IU]/mL (ref 0.350–4.500)

## 2011-12-09 LAB — BASIC METABOLIC PANEL
BUN: 33 mg/dL — ABNORMAL HIGH (ref 6–23)
Creatinine, Ser: 1.02 mg/dL (ref 0.50–1.10)
GFR calc Af Amer: 58 mL/min — ABNORMAL LOW (ref >90)
GFR calc non Af Amer: 50 mL/min — ABNORMAL LOW (ref >90)

## 2011-12-09 LAB — POCT I-STAT TROPONIN I

## 2011-12-09 MED ORDER — FUROSEMIDE 10 MG/ML IJ SOLN
40.0000 mg | Freq: Once | INTRAMUSCULAR | Status: AC
  Administered 2011-12-09: 40 mg via INTRAVENOUS
  Filled 2011-12-09: qty 4

## 2011-12-09 MED ORDER — FUROSEMIDE 10 MG/ML IJ SOLN
20.0000 mg | Freq: Two times a day (BID) | INTRAMUSCULAR | Status: DC
  Administered 2011-12-09 – 2011-12-10 (×3): 20 mg via INTRAVENOUS
  Filled 2011-12-09 (×3): qty 2

## 2011-12-09 MED ORDER — ACETAMINOPHEN 650 MG RE SUPP: 650.0000 mg | Freq: Four times a day (QID) | RECTAL | Status: DC | PRN

## 2011-12-09 MED ORDER — CARVEDILOL 25 MG PO TABS
25.0000 mg | ORAL_TABLET | Freq: Two times a day (BID) | ORAL | Status: DC
  Administered 2011-12-09 – 2011-12-10 (×3): 25 mg via ORAL
  Filled 2011-12-09 (×6): qty 1

## 2011-12-09 MED ORDER — ENOXAPARIN SODIUM 30 MG/0.3ML ~~LOC~~ SOLN
30.0000 mg | SUBCUTANEOUS | Status: DC
  Administered 2011-12-09 – 2011-12-11 (×3): 30 mg via SUBCUTANEOUS
  Filled 2011-12-09 (×3): qty 0.3

## 2011-12-09 MED ORDER — FLUTICASONE PROPIONATE 50 MCG/ACT NA SUSP
2.0000 | Freq: Every day | NASAL | Status: DC
  Administered 2011-12-09 – 2011-12-11 (×3): 2 via NASAL
  Filled 2011-12-09: qty 16

## 2011-12-09 MED ORDER — MIRTAZAPINE 30 MG PO TABS
30.0000 mg | ORAL_TABLET | Freq: Every day | ORAL | Status: DC
  Administered 2011-12-09 – 2011-12-10 (×2): 30 mg via ORAL
  Filled 2011-12-09 (×3): qty 1

## 2011-12-09 MED ORDER — LISINOPRIL 40 MG PO TABS
40.0000 mg | ORAL_TABLET | Freq: Every day | ORAL | Status: DC
  Administered 2011-12-09 – 2011-12-10 (×2): 40 mg via ORAL
  Filled 2011-12-09 (×3): qty 1

## 2011-12-09 MED ORDER — SODIUM CHLORIDE 0.9 % IJ SOLN: 3.0000 mL | INTRAMUSCULAR | Status: DC | PRN

## 2011-12-09 MED ORDER — COLESEVELAM HCL 625 MG PO TABS
1250.0000 mg | ORAL_TABLET | Freq: Two times a day (BID) | ORAL | Status: DC
  Administered 2011-12-09 – 2011-12-11 (×4): 1250 mg via ORAL
  Filled 2011-12-09 (×6): qty 2

## 2011-12-09 MED ORDER — BIOTENE DRY MOUTH MT LIQD
15.0000 mL | Freq: Two times a day (BID) | OROMUCOSAL | Status: DC
  Administered 2011-12-09 – 2011-12-11 (×4): 15 mL via OROMUCOSAL

## 2011-12-09 MED ORDER — ACETAMINOPHEN 325 MG PO TABS
650.0000 mg | ORAL_TABLET | Freq: Four times a day (QID) | ORAL | Status: DC | PRN
  Administered 2011-12-09 – 2011-12-10 (×4): 650 mg via ORAL
  Filled 2011-12-09 (×4): qty 2

## 2011-12-09 MED ORDER — VITAMIN D3 25 MCG (1000 UNIT) PO TABS
2000.0000 [IU] | ORAL_TABLET | Freq: Every day | ORAL | Status: DC
  Administered 2011-12-09 – 2011-12-11 (×3): 2000 [IU] via ORAL
  Filled 2011-12-09 (×3): qty 2

## 2011-12-09 MED ORDER — ONDANSETRON HCL 4 MG/2ML IJ SOLN: 4.0000 mg | Freq: Three times a day (TID) | INTRAMUSCULAR | Status: AC | PRN

## 2011-12-09 MED ORDER — SODIUM CHLORIDE 0.9 % IV SOLN: 250.0000 mL | INTRAVENOUS | Status: DC | PRN

## 2011-12-09 MED ORDER — SODIUM CHLORIDE 0.9 % IJ SOLN
3.0000 mL | Freq: Two times a day (BID) | INTRAMUSCULAR | Status: DC
  Administered 2011-12-09 – 2011-12-10 (×2): 3 mL via INTRAVENOUS

## 2011-12-09 MED ORDER — ALPRAZOLAM 0.25 MG PO TABS
0.2500 mg | ORAL_TABLET | Freq: Two times a day (BID) | ORAL | Status: DC | PRN
  Administered 2011-12-09 – 2011-12-10 (×2): 0.25 mg via ORAL
  Filled 2011-12-09 (×2): qty 1

## 2011-12-09 MED ORDER — SODIUM CHLORIDE 0.9 % IJ SOLN
3.0000 mL | Freq: Two times a day (BID) | INTRAMUSCULAR | Status: DC
  Administered 2011-12-09: 3 mL via INTRAVENOUS

## 2011-12-09 NOTE — Progress Notes (Signed)
INITIAL ADULT NUTRITION ASSESSMENT Date: 12/09/2011   Time: 11:45 AM Reason for Assessment: Consult   INTERVENTION: 1. No nutrition interventions at this time.  2. RD will follow   ASSESSMENT: Female 78 y.o.  Dx: Diastolic dysfunction  Hx:  Past Medical History  Diagnosis Date  . Atypical chest pain   . Palpitation   . SOB (shortness of breath)     Episodic  . Hypertensive urgency     Episodic  . HTN (hypertension)   . Asthma     Underlying  . Kidney disease     Chronic, stage 3, with creatinine clearance estimated to be about 40  . Anxiety     Chronic  . Constipation   . GERD (gastroesophageal reflux disease)   . Hiatal hernia     Hx of  . Hyperlipidemia   . Osteoarthritis     Related Meds:     . antiseptic oral rinse  15 mL Mouth Rinse BID  . carvedilol  25 mg Oral BID WC  . cholecalciferol  2,000 Units Oral Daily  . colesevelam  1,250 mg Oral BID WC  . enoxaparin (LOVENOX) injection  30 mg Subcutaneous Q24H  . fluticasone  2 spray Each Nare Daily  . furosemide  20 mg Intravenous Q12H  . furosemide  40 mg Intravenous Once  . lisinopril  40 mg Oral Daily  . mirtazapine  30 mg Oral QHS  . sodium chloride  3 mL Intravenous Q12H  . sodium chloride  3 mL Intravenous Q12H     Ht: 5\' 3"  (160 cm)  Wt: 180 lb 8.9 oz (81.9 kg)  Ideal Wt: 52.3 kg  % Ideal Wt: 157%  Usual Wt:  Wt Readings from Last 5 Encounters:  12/09/11 180 lb 8.9 oz (81.9 kg)  11/05/11 178 lb 6.4 oz (80.922 kg)  08/04/11 180 lb (81.647 kg)    % Usual Wt: ~100%  Body mass index is 31.98 kg/(m^2).  Pt BMI meets criteria for obesity class 1   Food/Nutrition Related Hx: no triggers on nutrition risk assessment on admission   Labs:  CMP     Component Value Date/Time   NA 140 12/09/2011 0650   K 4.5 12/09/2011 0650   CL 102 12/09/2011 0650   CO2 26 12/09/2011 0650   GLUCOSE 97 12/09/2011 0650   BUN 33* 12/09/2011 0650   CREATININE 1.02 12/09/2011 0650   CALCIUM 9.2 12/09/2011 0650   PROT 5.5* 10/23/2008 0520   ALBUMIN 3.0* 10/23/2008 0520   AST 22 10/23/2008 0520   ALT 17 10/23/2008 0520   ALKPHOS 43 10/23/2008 0520   BILITOT 0.6 10/23/2008 0520   GFRNONAA 50* 12/09/2011 0650   GFRAA 58* 12/09/2011 0650     Intake/Output Summary (Last 24 hours) at 12/09/11 1148 Last data filed at 12/09/11 0904  Gross per 24 hour  Intake    200 ml  Output   1825 ml  Net  -1625 ml     Diet Order: Carb Control  Supplements/Tube Feeding: none  IVF:    Estimated Nutritional Needs:   Kcal: 1500-1700 Protein: 60-70 gm Fluid:  1.5-1.7 L   Pt reports no change in appetite recently, has been gaining weight in the last few weeks r/t fluid in her legs. No problems chewing or swallowing.   NUTRITION DIAGNOSIS: No nutrition dx at this time.    EDUCATION NEEDS: -No education needs identified at this time -pt with CHF "living Well with Heart Failure" booklet at bedside.  DOCUMENTATION CODES Per approved criteria  -Obesity Unspecified    Clarene Duke RD, LDN Pager [phone] After Hours pager [phone]

## 2011-12-09 NOTE — H&P (Signed)
Amy Bolton is an 78 y.o. female.   PCP:   [name], MD   Chief Complaint:  DOE and Fatigue  HPI: 63 F S/P APE on July 17th who has known HTN-LVH/Aortic Stenosis.    Hyperlipidemia,    Anxiety,   CKD Stage III,    Secondary Hyperparathyroidism.,   Stress  incontenance,   GERD/Hiatal hernia,   PNA admission 02/2008.,   Allergic rhinitis (H/O immunotherapy w/Dr Corinda Gubler),   Non-obstructive CAD 40 - 50% LAD stenosis 2002 and 2007.  negative stress test 12/09.,   OA,   Gout.   H/O BCC, Viral gastroenteritis (2007),   Osteoporosis.  Insomnia.   Fell on 5/14 and 7/16 and has been having severe right hip pain radiating down leg. Also some recent failure to thrive as her pain has been bad.  She saw Chiro and Ortho - Dr Luiz Blare and had Steroid injections which did not help.  S/P MRI on spine and hip.  Was started on Lyrica - had mood changes and did poor on it.  MRI of the hip showed: Suspected tear of the anterior superior labrum. There is also a fluid signal intensity structure extending caudad from the joint between the obturator internus and abductor musculature, possibly a paralabral cyst or small unusual extension of joint fluid.  After the MRI she had a steroid injection in back and R Hip which helped 12-18 hrs.  Dr Luiz Blare does not feel she is a surgical candidate.  She stopped Welchol due to cost. Recent increased Edema.  Brought to the ED last night with progressive weakness and FTT.  Increasing SOB and increasing DOE.  Fatigue.  Sxs progressive 2-4 weeks. SHe just saw Dr Cherylann Ratel her nephrologist who adjusted her Lasix.  She had a rash and is also on a cream.  She has had 3 days of increased leg edema as well.  Recent ECHO in May: - Left ventricle: The cavity size was normal. Wall thickness was increased in a pattern of moderate LVH. Systolic function was normal. Wall motion was normal; there were no regional wall motion abnormalities. Doppler parameters are consistent with abnormal left  ventricular relaxation (grade 1 diastolic dysfunction). - Aortic valve: There was moderate stenosis. Mild regurgitation. Mean gradient: 26mm Hg (S). Valve area:1.37cm^2(VTI). - Aorta: Ascending aortic diameter: 38mm (S).   Denies any chest pain, fever, abdominal pain, confusion, lightheadedness, syncope, nausea, dysuria, increased frequency or urgency.   In ED labs were fine Cr 1.1.  Pro BNP high.  TI (-).  EKG showed normal sinus rhythm with occasional PVC and nonspecific changes.  CXR was clear.  Dxed with CHF=Diastolic Dysfunction - given IV Lasix and admitted.  Currently Urinating Often and lots of volume.  No Foley needed.  She is weak and tired.  Wearing FIO2.  Not SOB supine in bed at this moment.     Past Medical History:  Past Medical History  Diagnosis Date  . Atypical chest pain   . Palpitation   . SOB (shortness of breath)     Episodic  . Hypertensive urgency     Episodic  . HTN (hypertension)   . Asthma     Underlying  . Kidney disease     Chronic, stage 3, with creatinine clearance estimated to be about 40  . Anxiety     Chronic  . Constipation   . GERD (gastroesophageal reflux disease)   . Hiatal hernia     Hx of  . Hyperlipidemia   .  Osteoarthritis    HTN-LVH/Aortic Stenosis.    Hyperlipidemia,    Anxiety.,   CKD Stage III,    Secondary Hyperparathyroidism.,   Stress  incontenance,   GERD/Hiatal hernia,   PNA admission 02/2008.,   Allergic rhinitis (H/O immunotherapy w/Dr Corinda Gubler),   Non-obstructive CAD 40 - 50% LAD stenosis 2002 and 2007.  negative stress test 12/09.,   OA,   Gout.   H/O BCC, Viral gastroenteritis (2007),   Osteoporosis.  Insomnia.  PNA Vax 03/2006   Past Surgical History  Procedure Date  . US echocardiography 10-12-08    2-D done at [facility] on October 12, 2008 showed some diastolic dysfunction, EF 55%, mild tricuspid regurgitation, right ventricular systolic pressure elevated a 40-50 mmHg, mild valvular aortic  stenosis noted.  . Cardiovascular stress test 04-19-2008    showing EF 69% with otherwise a normal stress nuclear study  . Tonsillectomy   . Adenoidectomy   . Spine surgery     2001, lumbar at the L4-5 level; in 2004, C-spine operation with fusion at the  C5-6 level; in 2004, left thumb CMC joint operation; in 2005 bilateral rotator cuff surgeries  . Bladder suspension     surgery  . Cardiac catheterization 01-2006    showing minimal proximal left anterior descending artery disease. showing less than 50% stenosis of the proximal LAD and otherwise normal coronary arteries,  . Back surgery     x2   Hysterectomy Cholecystectomy Rectocele/bladder tac Lumbar and Cervical Laminectomies. Right and left rotator cuff Tonsillectomy w/Adnoids     Allergies:   Allergies  Allergen Reactions  . Ampicillin     Unknown  . Penicillins     Unknown     Medications: Prior to Admission medications   Medication Sig Start Date End Date Taking? Authorizing Provider  ALPRAZolam (XANAX) 0.25 MG tablet Take 0.25 mg by mouth at bedtime as needed. For anxiety   Yes Historical Provider, MD  carvedilol (COREG) 25 MG tablet Take 1 tablet (25 mg total) by mouth 2 (two) times daily with a meal. 08/04/11  Yes Vesta Mixer, MD  cholecalciferol (VITAMIN D) 1000 UNITS tablet Take 2,000 Units by mouth daily.    Yes Historical Provider, MD  colesevelam (WELCHOL) 625 MG tablet Take 1,250 mg by mouth 2 (two) times daily with a meal.   Yes Historical Provider, MD  mirtazapine (REMERON) 30 MG tablet Take 30 mg by mouth at bedtime.   Yes Historical Provider, MD  lisinopril (PRINIVIL,ZESTRIL) 40 MG tablet Take 40 mg by mouth daily.    Historical Provider, MD  valsartan (DIOVAN) 160 MG tablet Take 160 mg by mouth daily.     Historical Provider, MD     Medications Prior to Admission  Medication Sig Dispense Refill  . ALPRAZolam (XANAX) 0.25 MG tablet Take 0.25 mg by mouth at bedtime as needed. For anxiety      .  carvedilol (COREG) 25 MG tablet Take 1 tablet (25 mg total) by mouth 2 (two) times daily with a meal.  60 tablet  3  . cholecalciferol (VITAMIN D) 1000 UNITS tablet Take 2,000 Units by mouth daily.       . colesevelam (WELCHOL) 625 MG tablet Take 1,250 mg by mouth 2 (two) times daily with a meal.      . mirtazapine (REMERON) 30 MG tablet Take 30 mg by mouth at bedtime.      Marland Kitchen lisinopril (PRINIVIL,ZESTRIL) 40 MG tablet Take 40 mg by mouth daily.      Marland Kitchen  valsartan (DIOVAN) 160 MG tablet Take 160 mg by mouth daily.        Complete Medication List: 1)  Lisinopril 40 Mg Tabs (Lisinopril) .Marland Kitchen.. 1 po qd 2)  Flonase 50 Mcg/act Susp (Fluticasone propionate) .... 2 sprays each nostril qhs 3)  Ambien 5 Mg Tabs (Zolpidem tartrate) .Marland Kitchen.. 1 po qhs prn 4)  Remeron 30 Mg Tabs (Mirtazapine) .... Take one tablet by mouth at bedtime 5)  Alprazolam 0.25 Mg Tabs (Alprazolam) .... Take one tablet by mouth three times daily as directed 6)  Coreg 25 Mg Tabs (Carvedilol) .Marland Kitchen.. 1 po bid 7)  Omega 3 Cpdr (Omega-3 fatty acids cpdr) .... Take one tablet daily 8)  Diovan 80 Mg Tabs (Valsartan) .... As directed daily 9)  Welchol 625 Mg Tabs (Colesevelam hcl) .... 2 twice per day   Social History:  reports that she has never smoked. She does not have any smokeless tobacco history on file. She reports that she does not drink alcohol or use illicit drugs. She is a widow.  She is retired.  She does not smoke or drink.   Family History: History reviewed. No pertinent family history.  Father and mother are both deceased.  History included COPD and alzheimers.  Review of Systems:  Review of Systems - Full ROS obtained and see HPI.  O/w (-).  Hip in less pain than at Physical Decreased ROM though.  Physical Exam:  Blood pressure 162/84, pulse 77, temperature 98 F (36.7 C), temperature source Oral, resp. rate 20, height 5\' 3"  (1.6 m), weight 81.9 kg (180 lb 8.9 oz), SpO2 96.00%. Filed Vitals:   12/08/11 2228 12/08/11 2330  12/09/11 0315 12/09/11 0510  BP: 145/69 177/98 159/77 162/84  Pulse:  71 78 77  Temp:  98.2 F (36.8 C) 98 F (36.7 C) 98 F (36.7 C)  TempSrc:  Oral Oral Oral  Resp: 22 16 20 20   Height:    5\' 3"  (1.6 m)  Weight:    81.9 kg (180 lb 8.9 oz)  SpO2: 95% 97% 98% 96%   General appearance: Alert and Oriented Head: Normocephalic, without obvious abnormality, atraumatic Eyes: conjunctivae/corneas clear. PERRL, EOM's intact.  Nose: Nares normal. Septum midline. Mucosa normal. No drainage or sinus tenderness. Throat: lips, mucosa, and tongue normal; teeth and gums normal Neck: no adenopathy, no carotid bruit, no JVD and thyroid not enlarged, symmetric, no tenderness/mass/nodules Resp: Mild Rales Cardio: Reg 2/6 m GI: soft, non-tender; bowel sounds normal; no masses,  no organomegaly Extremities: extremities normal, atraumatic, no cyanosis. @ 1+ edema Pulses: 2+ and symmetric Lymph nodes:no cervical lymphadenopathy Neurologic: Alert and oriented X 3, normal strength and tone. Normal symmetric reflexes.   Skin AK's - using creams for skin per derm - she stopped and she has "rash".  Monitoring. This is less of a rash than an expected reaction from the cream.  She also has a L elbow bruise.   Labs on Admission:   [facility] 12/08/11 2315  NA 137  K 4.5  CL 108  CO2 --  GLUCOSE 129*  BUN 35*  CREATININE 1.10  CALCIUM --  MG --  PHOS --   No results found for this basename: AST:2,ALT:2,ALKPHOS:2,BILITOT:2,PROT:2,ALBUMIN:2 in the last 72 hours No results found for this basename: LIPASE:2,AMYLASE:2 in the last 72 hours  Basename 12/08/11 2315 12/08/11 2118  WBC -- 11.6*  NEUTROABS -- --  HGB 13.9 13.5  HCT 41.0 40.0  MCV -- 93.5  PLT -- 197   No results found  for this basename: CKTOTAL:3,CKMB:3,CKMBINDEX:3,TROPONINI:3 in the last 72 hours Lab Results  Component Value Date   INR 0.9 10/21/2008     LAB RESULT POCT:  Results for orders placed during the hospital encounter of  12/08/11  CBC      Component Value Range   WBC 11.6 (*) 4.0 - 10.5 K/uL   RBC 4.28  3.87 - 5.11 MIL/uL   Hemoglobin 13.5  12.0 - 15.0 g/dL   HCT 21.3  08.6 - 57.8 %   MCV 93.5  78.0 - 100.0 fL   MCH 31.5  26.0 - 34.0 pg   MCHC 33.8  30.0 - 36.0 g/dL   RDW 46.9  62.9 - 52.8 %   Platelets 197  150 - 400 K/uL  URINALYSIS, ROUTINE W REFLEX MICROSCOPIC      Component Value Range   Color, Urine YELLOW  YELLOW   APPearance CLEAR  CLEAR   Specific Gravity, Urine 1.015  1.005 - 1.030   pH 5.0  5.0 - 8.0   Glucose, UA NEGATIVE  NEGATIVE mg/dL   Hgb urine dipstick NEGATIVE  NEGATIVE   Bilirubin Urine NEGATIVE  NEGATIVE   Ketones, ur NEGATIVE  NEGATIVE mg/dL   Protein, ur NEGATIVE  NEGATIVE mg/dL   Urobilinogen, UA 0.2  0.0 - 1.0 mg/dL   Nitrite NEGATIVE  NEGATIVE   Leukocytes, UA TRACE (*) NEGATIVE  POCT I-STAT, CHEM 8      Component Value Range   Sodium 137  135 - 145 mEq/L   Potassium 4.5  3.5 - 5.1 mEq/L   Chloride 108  96 - 112 mEq/L   BUN 35 (*) 6 - 23 mg/dL   Creatinine, Ser 4.13  0.50 - 1.10 mg/dL   Glucose, Bld 244 (*) 70 - 99 mg/dL   Calcium, Ion 0.10  2.72 - 1.30 mmol/L   TCO2 20  0 - 100 mmol/L   Hemoglobin 13.9  12.0 - 15.0 g/dL   HCT 53.6  64.4 - 03.4 %  PRO B NATRIURETIC PEPTIDE      Component Value Range   Pro B Natriuretic peptide (BNP) 745.7 (*) 0 - 450 pg/mL  POCT I-STAT TROPONIN I      Component Value Range   Troponin i, poc 0.02  0.00 - 0.08 ng/mL   Comment 3           URINE MICROSCOPIC-ADD ON      Component Value Range   Squamous Epithelial / LPF RARE  RARE   WBC, UA 3-6  <3 WBC/hpf   Bacteria, UA RARE  RARE      Radiological Exams on Admission: Dg Chest 2 View  12/08/2011  *RADIOLOGY REPORT*  Clinical Data: Chest pain and shortness of breath.  CHEST - 2 VIEW  Comparison: Chest x-ray 11/16/2009.  Findings: Lung volumes are normal.  No consolidative airspace disease.  No pleural effusions.  No pneumothorax.  No pulmonary nodule or mass noted.   Pulmonary vasculature and the cardiomediastinal silhouette are within normal limits. Atherosclerotic calcifications of the thoracic aorta.  Orthopedic fixation hardware in the lower cervical spine incidentally noted.  IMPRESSION: 1. No radiographic evidence of acute cardiopulmonary disease. 2.  Atherosclerosis.  Original Report Authenticated By: Florencia Reasons, M.D.      Orders placed during the hospital encounter of 12/08/11  . ED EKG  . ED EKG  . EKG 12-LEAD  . EKG 12-LEAD     Assessment/Plan Principal Problem:  *Diastolic dysfunction Active Problems:  HTN (hypertension)  Volume overload  LVH (left ventricular hypertrophy)  Aortic stenosis, moderate  Hyperlipidemia  Hip pain  Adult failure to thrive  CKD (chronic kidney disease) stage 3, GFR 30-59 ml/min  CAD (coronary artery disease)  Obesity  Rash  Problem # 1: DIASTOLIC DYSFUNCTION (ICD-429.9)/CHF with Adult FTT - Admit.  Diurese with IV Lasix.  Follow weights.  Hold on repeating ECHO.  Follow Cr.  Work on strength and adjust meds.  PT?OT/SW consults.  Dietary consult as well.  Reduce her current Vol Overload. DVT Proph ordered  Problem # 2: HTN-LVH/Aortic Stenosis - BP OK  And better than last night now that some fluid is off.  Monitor and adjust as needed.She recently changed Diovan to lisinopril for cost.  Watch for cough.  Her Home meds are:    Lisinopril 40 Mg Tabs (Lisinopril) .Marland Kitchen... 1 po qd    Coreg 25 Mg Tabs (Carvedilol) .Marland Kitchen... 1 po bid    Diovan 80 Mg Tabs (Valsartan) .Marland Kitchen... As directed daily   Problem # 3:  HYPERLIPIDEMIA, MIXED (ICD-272.2) -   Lipids are terrible bc she is less compliant due to $$.  Cannot tol statins.      LDL                       161 mg/dl      Welchol 096 Mg Tabs (Colesevelam hcl) .Marland Kitchen... 2 twice per day when she can afford.  May switch to Cholestyramine     Problem # 4:  EDEMA LEG (ICD-782.3) Edema - Push Diuretics   Problem # 5:  BACK PAIN and R Hip Pain.  S/P Steroid  injections. Treat Pain. Get Therapy. H/O prior Lumbar Ray Cage Fusion L3-4, L4-5, SI Joint Arthritis - S/P SI injections and other steroid injections. Recent Hip MRI showed  Suspected tear of the anterior superior labrum.  There is also a fluid signal intensity structure extending caudad from the joint between the obturator internus and abductor musculature, possibly a paralabral cyst or small unusual extension of joint fluid. MRI 08/16/10 Thoracic Spine: Mild thoracic spondylosis.  T8-T9 tiny central protrusion contacts and mildly flattens the ventral aspect of the thoracic cord.  The T1-T2 broad-based disc protrusion contacts and flattens the ventral cord.  Mild T1-T2 bilateral foraminal stenosis secondary to broad-based protrusion in the foramina.  Prn pain meds and f/up Ortho. Continue walker.  Get therapy and consider SNF on D/C   Problem # 6:  ANXIETY (ICD-300.00) Anxiety - lorazempam.,       Remeron 30 Mg Tabs (Mirtazapine) .Marland Kitchen... Take one tablet by mouth at bedtime    Alprazolam 0.25 Mg Tabs (Alprazolam) .Marland Kitchen... Take one tablet by mouth three times daily as directed   Problem # 7:  GASTROESOPHAGEAL REFLUX DISEASE, CHRONIC (ICD-530.81) GERD/Hiatal hernia - Sxs Controlled.  Problem # 8:  CHRONIC KIDNEY DISEASE STAGE III (MODERATE) (ICD-585.3) CKD Stage III - stable - Dr Filomena Jungling with Secondary Hyperparathyroidism.  Just saw him yesterday.   CKDz3 GFR 47. proteinuria. Diovan changed to lisinopril recently due to $.   Cr 1.0 - 1.2 may be too low with her level of volume. Watch Cr with Diuresis.   Problem # 9:  CORONARY ARTERY DISEASE (ICD-414.00) Non-obstructive CAD 40 - 50% LAD stenosis 2002 and 2007.  negative stress test 12/09 - Just saw Dr Elease Hashimoto recently and had ECHO.,  Minimal coronary artery disease by cardiac cath 2007 No current Angina but she is dysfunctional due to back and leg  Problem #  10:  INSOMNIA (ICD-780.52) OK for Ambien 5 Mg Tabs (Zolpidem tartrate) .Marland Kitchen... 1 po qhs  prn  Problem # 11:  GOUT (ICD-274.9) Gout - No recent issues and Uric acid OK to a little high.  Problem # 12:  OSTEOARTHRITIS (ICD-715.90)  Problem # 13:  CONSTIPATION (ICD-564.00) Colace prn hs with full glass of water metamucil as needed dietary fiber Gas x as needed or any generic Simethicone Miralax daily as needed   Problem # 14:  OSTEOPOROSIS (ICD-733.00)  Osteoporosis - Bisphosphonates are probably not the best idea with her kidney issues.  We will stay away from Fosamax Actonel and Boniva and Injectable Reclast.  We discussed Forteo and Prolia at her APE and she remains not interested at that time..     Problem # 15:  AORTIC STENOSIS, MODERATE (ICD-424.1)  Aortic stenosis - mild per Dr Elease Hashimoto - being followed by him. Afterload reduce.  Problem # 16:  EXOGENOUS OBESITY (ICD-278.00) Needs weight loss when she can regain function.  Problem 17 - AK- S/P creams - healing with "rash" as expected.   Granvel Proudfoot M 12/09/2011, 6:52 AM

## 2011-12-09 NOTE — ED Notes (Signed)
Renal failure, AS, seen by nephrologist recently and started lasix recently for edema, now increased fatigue and SOB on exertion.  No SOB at rest, no CP, lives alone but has difficulty with ambulation with walker b/c of generalized weakness.  PE:  Rales on exam, trace pitting edema, difficulty sitting up b/c of generalized weakness.  No focal weakness, she is alert and oriented with soft abdomen.  Murmur on exam.  ECG - ectopy with PVC's, no other sig changes.  Asssessment, SOB and fatigue, explore metabolic, cardiac and infectious etiologies.  Medical screening examination/treatment/procedure(s) were conducted as a shared visit with non-physician practitioner(s) and myself.  I personally evaluated the patient during the encounter  I discussed care with the internist Dr. Waynard Edwards who has agreed to temporary holding orders for admission to the hospital.    Vida Roller, MD 12/09/11 [phone]

## 2011-12-09 NOTE — Evaluation (Signed)
Physical Therapy Evaluation Patient Details Name: Amy Bolton MRN: [id number] DOB: 1927/07/26 Today's Date: 12/09/2011 Time: 1478-2956 PT Time Calculation (min): 25 min  PT Assessment / Plan / Recommendation Clinical Impression  Patient s/p CHF with decr mobility secondary to deconditioning.  Will benefit from PT to address balance and endurance issues.  May need short term NHP prior to d/c home alone.      PT Assessment  Patient needs continued PT services    Follow Up Recommendations  Skilled nursing facility;Supervision/Assistance - 24 hour    Barriers to Discharge Decreased caregiver support      Equipment Recommendations  Defer to next venue    Recommendations for Other Services     Frequency Min 3X/week    Precautions / Restrictions Precautions Precautions: Fall Restrictions Weight Bearing Restrictions: No   Pertinent Vitals/Pain VSS, Some pain      Mobility  Bed Mobility Bed Mobility: Rolling Right;Right Sidelying to Sit Rolling Right: 4: Min assist;With rail Right Sidelying to Sit: 4: Min assist;With rails;HOB elevated Transfers Transfers: Sit to Stand;Stand to Sit Sit to Stand: 4: Min assist;With upper extremity assist;From bed Stand to Sit: 4: Min assist;With upper extremity assist;With armrests;To chair/3-in-1 Details for Transfer Assistance: steadying assist provided with sit to stand.   Ambulation/Gait Ambulation/Gait Assistance: 4: Min guard Ambulation Distance (Feet): 45 Feet Assistive device: Rolling walker Ambulation/Gait Assistance Details: Needed cues for staying close to RW and for sequencing steps and RW.  cues needed for postural stability Gait Pattern: Decreased stride length;Trunk flexed Stairs: No Wheelchair Mobility Wheelchair Mobility: No         PT Diagnosis: Generalized weakness  PT Problem List: Decreased activity tolerance;Decreased balance;Decreased mobility;Decreased safety awareness;Decreased knowledge of use of DME PT  Treatment Interventions: DME instruction;Gait training;Functional mobility training;Therapeutic activities;Therapeutic exercise;Balance training;Patient/family education   PT Goals Acute Rehab PT Goals PT Goal Formulation: With patient Time For Goal Achievement: 12/23/11 Potential to Achieve Goals: Good Pt will go Supine/Side to Sit: Independently PT Goal: Supine/Side to Sit - Progress: Goal set today Pt will go Sit to Stand: Independently;with upper extremity assist PT Goal: Sit to Stand - Progress: Goal set today Pt will Ambulate: 51 - 150 feet;with modified independence;with least restrictive assistive device PT Goal: Ambulate - Progress: Goal set today Additional Goals Additional Goal #1: Patient to perform Berg with score of greater than 48/56.   PT Goal: Additional Goal #1 - Progress: Goal set today  Visit Information  Last PT Received On: 12/09/11 Assistance Needed: +1    Subjective Data  Subjective: "I feel ok." Patient Stated Goal: To do for myself   Prior Functioning  Home Living Lives With: Alone Available Help at Discharge: Family;Available PRN/intermittently (daughter works) Type of Home: House Home Access: Level entry Home Layout: One level Bathroom Shower/Tub: Health visitor: Handicapped height Home Adaptive Equipment: Engineer, drilling - four wheeled;Straight cane Prior Function Level of Independence: Independent with assistive device(s) Able to Take Stairs?: No Driving: Yes Vocation: Retired Musician: No difficulties Dominant Hand: Left    Cognition  Overall Cognitive Status: Appears within functional limits for tasks assessed/performed Arousal/Alertness: Awake/alert Orientation Level: Appears intact for tasks assessed Behavior During Session: [facility] for tasks performed    Extremity/Trunk Assessment Right Upper Extremity Assessment RUE ROM/Strength/Tone: WFL for tasks assessed RUE Sensation: WFL - Light  Touch RUE Coordination: WFL - gross/fine motor Left Upper Extremity Assessment LUE ROM/Strength/Tone: WFL for tasks assessed LUE Sensation: WFL - Light Touch LUE Coordination: WFL - gross/fine  motor Right Lower Extremity Assessment RLE ROM/Strength/Tone: WFL for tasks assessed RLE Sensation: WFL - Light Touch RLE Coordination: WFL - gross/fine motor Left Lower Extremity Assessment LLE ROM/Strength/Tone: WFL for tasks assessed LLE Sensation: WFL - Light Touch LLE Coordination: WFL - gross/fine motor   Balance Static Standing Balance Static Standing - Balance Support: Bilateral upper extremity supported;During functional activity Static Standing - Level of Assistance: 5: Stand by assistance Static Standing - Comment/# of Minutes: 2  End of Session PT - End of Session Equipment Utilized During Treatment: Gait belt Activity Tolerance: Patient limited by fatigue Patient left: in chair;with call bell/phone within reach Nurse Communication: Mobility status       INGOLD,Debara Kamphuis 12/09/2011, 12:28 PM  [facility] [phone] (pager)

## 2011-12-09 NOTE — Progress Notes (Signed)
Clinical Social Work Department BRIEF PSYCHOSOCIAL ASSESSMENT 12/09/2011  Patient:  Amy Bolton, Amy Bolton     Account Number:  [id number]     Admit date:  12/08/2011  Clinical Social Worker:  Juliette Mangle  Date/Time:  12/09/2011 02:11 PM  Referred by:  Physician  Date Referred:  12/09/2011 Referred for  SNF Placement   Other Referral:   Interview type:  Patient Other interview type:    PSYCHOSOCIAL DATA Living Status:  ALONE Admitted from facility:   Level of care:   Primary support name:  Gildardo Pounds Primary support relationship to patient:  CHILD, ADULT Degree of support available:   [place]    CURRENT CONCERNS Current Concerns  Post-Acute Placement   Other Concerns:    SOCIAL WORK ASSESSMENT / PLAN CSW received a referral from MD for SNF placement. PT assessed patient and also reccomencded ST-SNF for patient. CSW met with patient  at bedside. CSW  introduced self, explained role and discussed SNF placement. Patient was very pleasant and reported that even though she does not want to go to a SNF she feels it would be best for her to go to build up her strength. CSW explained the benefits of ST-SNF and answered all questions to the patient's satisfaction.  With the patient's permission, CSW will f/u with patients daughter. Patient reported that she would like a SNF in [place] or in [place]. Patient's top choices were [facility]  CSW will begin the paperwork for SNF placement. CSW will continue to follow and assist with all d/c needs   Assessment/plan status:  Psychosocial Support/Ongoing Assessment of Needs Other assessment/ plan:   Information/referral to community resources:   CSW provided patient with choice list    PATIENT'S/FAMILY'S RESPONSE TO PLAN OF CARE: Patient as very appreciative of information and resources provided by CSW. CSW will continue to follow and assist with all d/c needs.   Sabino Niemann, MSW,  [employer] [phone]

## 2011-12-09 NOTE — Clinical Social Work Placement (Addendum)
Clinical Social Work Department CLINICAL SOCIAL WORK PLACEMENT NOTE 12/09/2011  Patient:  DANIYA, ARAMBURO  Account Number:  [id number] Admit date:  12/08/2011  Clinical Social Worker:  Tyjai Matuszak Lubertha Basque  Date/time:  12/09/2011 02:26 PM  Clinical Social Work is seeking post-discharge placement for this patient at the following level of care:   SKILLED NURSING   (*CSW will update this form in Epic as items are completed)   12/09/2011  Patient/family provided with [facility] Department of Clinical Social Work's list of facilities offering this level of care within the geographic area requested by the patient (or if unable, by the patient's family).  12/09/2011  Patient/family informed of their freedom to choose among providers that offer the needed level of care, that participate in Medicare, Medicaid or managed care program needed by the patient, have an available bed and are willing to accept the patient.  12/09/2011  Patient/family informed of [facility]' ownership interest in [facility], as well as of the fact that they are under no obligation to receive care at this facility.  PASARR submitted to EDS on 12/09/2011 PASARR number received from EDS on 12/09/2011  FL2 transmitted to all facilities in geographic area requested by pt/family on  12/09/2011 FL2 transmitted to all facilities within larger geographic area on   Patient informed that his/her managed care company has contracts with or will negotiate with  certain facilities, including the following:     Patient/family informed of bed offers received:   12/09/2011  Patient chooses bed at [facility] Physician recommends and patient chooses bed at  [facility]  Patient to be transferred to  on   Patient to be transferred to facility by   The following physician request were entered in Epic:   Additional Comments:  Sabino Niemann, MSW, [employer] [phone]

## 2011-12-09 NOTE — ED Notes (Signed)
IV team returned page and enroute to ED.

## 2011-12-09 NOTE — ED Provider Notes (Signed)
Medical screening examination/treatment/procedure(s) were conducted as a shared visit with non-physician practitioner(s) and myself.  I personally evaluated the patient during the encounter  Please see my separate respective documentation pertaining to this patient encounter   Vida Roller, MD 12/09/11 [phone]

## 2011-12-09 NOTE — Progress Notes (Signed)
Pt admitted through ED, arrived via stretcher with family at side.  Pt a&o x 4, pleasant and cooperative. No SOB noted at this time.  Uses bedpan.  Continent of b/b.  Denies pain or discomfort.  Pt having trigeminy pvc's.  MD on call informed of admission and pvc's, new orders for labs only at this time.

## 2011-12-10 LAB — CBC
HCT: 40.3 % (ref 36.0–46.0)
MCHC: 34.5 g/dL (ref 30.0–36.0)
Platelets: 196 10*3/uL (ref 150–400)
RDW: 14.2 % (ref 11.5–15.5)
WBC: 10.6 10*3/uL — ABNORMAL HIGH (ref 4.0–10.5)

## 2011-12-10 LAB — CORTISOL: Cortisol, Plasma: 0.6 ug/dL

## 2011-12-10 LAB — BASIC METABOLIC PANEL
BUN: 50 mg/dL — ABNORMAL HIGH (ref 6–23)
Chloride: 95 mEq/L — ABNORMAL LOW (ref 96–112)
Creatinine, Ser: 1.32 mg/dL — ABNORMAL HIGH (ref 0.50–1.10)
GFR calc Af Amer: 42 mL/min — ABNORMAL LOW (ref >90)
GFR calc non Af Amer: 36 mL/min — ABNORMAL LOW (ref >90)

## 2011-12-10 LAB — PRO B NATRIURETIC PEPTIDE: Pro B Natriuretic peptide (BNP): 818 pg/mL — ABNORMAL HIGH (ref 0–450)

## 2011-12-10 MED ORDER — FUROSEMIDE 10 MG/ML IJ SOLN
20.0000 mg | Freq: Every day | INTRAMUSCULAR | Status: AC
  Administered 2011-12-10: 20 mg via INTRAVENOUS

## 2011-12-10 MED ORDER — HYOSCYAMINE SULFATE 0.125 MG SL SUBL
0.2500 mg | SUBLINGUAL_TABLET | Freq: Four times a day (QID) | SUBLINGUAL | Status: DC | PRN
  Administered 2011-12-10: 0.25 mg via SUBLINGUAL
  Filled 2011-12-10 (×2): qty 2

## 2011-12-10 MED ORDER — FUROSEMIDE 20 MG PO TABS
20.0000 mg | ORAL_TABLET | Freq: Every day | ORAL | Status: DC
  Administered 2011-12-11: 20 mg via ORAL
  Filled 2011-12-10: qty 1

## 2011-12-10 NOTE — Progress Notes (Signed)
Subjective: Admitted with Diastolic Dysfunction CHF, Vol Overload, FTT. Feels better post Diuresis and sleep. Doing better. Less SOB.  No LE edema, Worked with PT.  Objective: Vital signs in last 24 hours: Temp:  [97.4 F (36.3 C)-98.6 F (37 C)] 97.4 F (36.3 C) (08/01 0544) Pulse Rate:  [68-81] 69  (08/01 0544) Resp:  [18] 18  (07/31 1355) BP: (91-120)/(44-77) 116/44 mmHg (08/01 0544) SpO2:  [93 %-97 %] 95 % (08/01 0544) Weight:  [80.513 kg (177 lb 8 oz)] 80.513 kg (177 lb 8 oz) (08/01 0544) Weight change: -1.387 kg (-3 lb 0.9 oz) Last BM Date: 12/09/11  CBG (last 3)  No results found for this basename: GLUCAP:3 in the last 72 hours  Intake/Output from previous day:  Intake/Output Summary (Last 24 hours) at 12/10/11 0721 Last data filed at 12/09/11 2208  Gross per 24 hour  Intake    783 ml  Output   1000 ml  Net   -217 ml   07/31 0701 - 08/01 0700 In: 783 [P.O.:780; I.V.:3] Out: 1000 [Urine:1000]   Physical Exam General appearance: A and O Eyes: no scleral icterus Throat: oropharynx moist without erythema Resp: CTA Cardio: reg.  m 2/6 GI: soft, non-tender; bowel sounds normal; no masses,  no organomegaly Extremities: no clubbing, cyanosis or edema   Lab Results:  Basename 12/09/11 0650 12/08/11 2315  NA 140 137  K 4.5 4.5  CL 102 108  CO2 26 --  GLUCOSE 97 129*  BUN 33* 35*  CREATININE 1.02 1.10  CALCIUM 9.2 --  MG 2.3 --  PHOS 3.7 --    No results found for this basename: AST:2,ALT:2,ALKPHOS:2,BILITOT:2,PROT:2,ALBUMIN:2 in the last 72 hours   Basename 12/10/11 0549 12/08/11 2315 12/08/11 2118  WBC 10.6* -- 11.6*  NEUTROABS -- -- --  HGB 13.9 13.9 --  HCT 40.3 41.0 --  MCV 92.9 -- 93.5  PLT 196 -- 197    Lab Results  Component Value Date   INR 0.9 10/21/2008    No results found for this basename: CKTOTAL:3,CKMB:3,CKMBINDEX:3,TROPONINI:3 in the last 72 hours   Basename 12/09/11 0103  TSH 0.636  T4TOTAL --  T3FREE --  THYROIDAB --     No results found for this basename: VITAMINB12:2,FOLATE:2,FERRITIN:2,TIBC:2,IRON:2,RETICCTPCT:2 in the last 72 hours  Micro Results: No results found for this or any previous visit (from the past 240 hour(s)).   Studies/Results: Dg Chest 2 View  12/08/2011  *RADIOLOGY REPORT*  Clinical Data: Chest pain and shortness of breath.  CHEST - 2 VIEW  Comparison: Chest x-ray 11/16/2009.  Findings: Lung volumes are normal.  No consolidative airspace disease.  No pleural effusions.  No pneumothorax.  No pulmonary nodule or mass noted.  Pulmonary vasculature and the cardiomediastinal silhouette are within normal limits. Atherosclerotic calcifications of the thoracic aorta.  Orthopedic fixation hardware in the lower cervical spine incidentally noted.  IMPRESSION: 1. No radiographic evidence of acute cardiopulmonary disease. 2.  Atherosclerosis.  Original Report Authenticated By: Florencia Reasons, M.D.     Medications: Scheduled:   . antiseptic oral rinse  15 mL Mouth Rinse BID  . carvedilol  25 mg Oral BID WC  . cholecalciferol  2,000 Units Oral Daily  . colesevelam  1,250 mg Oral BID WC  . enoxaparin (LOVENOX) injection  30 mg Subcutaneous Q24H  . fluticasone  2 spray Each Nare Daily  . furosemide  20 mg Intravenous Q12H  . lisinopril  40 mg Oral Daily  . mirtazapine  30 mg Oral QHS  .  sodium chloride  3 mL Intravenous Q12H  . sodium chloride  3 mL Intravenous Q12H   Continuous:    Assessment/Plan: Principal Problem:  *Diastolic dysfunction Active Problems:  HTN (hypertension)  Volume overload  LVH (left ventricular hypertrophy)  Aortic stenosis, moderate  Hyperlipidemia  Hip pain  Adult failure to thrive  CKD (chronic kidney disease) stage 3, GFR 30-59 ml/min  CAD (coronary artery disease)  Obesity  Rash  Diastolic Dysfunction - Doing Better post Diuresis.  Cr stable.  Adjust Lasix and have PO for tomorrow. Deconditioning/FTT - Now has walker.  She wants to go to [facility] and will be ready by tomorrow to go. FL-2 Signed. PT plan reviewed. HTN/LVH - BP better post diuresis. CKD 3 - Cr 1 and stable. Dr Filomena Jungling as an outpt. Back and R hip pain - Known Labrum tear - PT and time and ortho prn. 1V CAD - Dr Elease Hashimoto.  No angina. Moderate AS - being followed by [facility]- trying not to over - diurese. Obesity - needs wt loss. AKs and "chemo" creams per Derm - with expected skin reaction. DVT Prophylaxis = Lovenox.  To [facility].    LOS: 2 days   Mac Dowdell M 12/10/2011, 7:21 AM

## 2011-12-10 NOTE — Evaluation (Signed)
Occupational Therapy Evaluation Patient Details Name: Amy Bolton MRN: [id number] DOB: June 25, 1927 Today's Date: 12/10/2011 Time: 4098-1191 OT Time Calculation (min): 22 min  OT Assessment / Plan / Recommendation Clinical Impression  Pt admitted with CHF with deficits listed below.  Pt would benefit from cont. OT to increase safety with adls and pt's activity tolerance.      OT Assessment  Patient needs continued OT Services    Follow Up Recommendations  Skilled nursing facility    Barriers to Discharge Decreased caregiver support Pt lives alone.  PRN assist.  Equipment Recommendations  Defer to next venue    Recommendations for Other Services    Frequency  Min 2X/week    Precautions / Restrictions Precautions Precautions: Fall Restrictions Weight Bearing Restrictions: No   Pertinent Vitals/Pain Pt c/o back pain 5/10.  Pt on 2 L [place] O2    ADL  Eating/Feeding: Performed;Independent Where Assessed - Eating/Feeding: Bed level Grooming: Performed;Wash/dry hands;Teeth care;Min guard Where Assessed - Grooming: Supported standing Upper Body Bathing: Simulated;Set up Where Assessed - Upper Body Bathing: Unsupported sitting Lower Body Bathing: Simulated;Minimal assistance Where Assessed - Lower Body Bathing: Supported sit to stand Upper Body Dressing: Simulated;Set up Where Assessed - Upper Body Dressing: Unsupported sitting Lower Body Dressing: Performed;Minimal assistance Where Assessed - Lower Body Dressing: Supported sit to stand Toilet Transfer: Performed;Minimal assistance Toilet Transfer Method: Surveyor, minerals: Comfort height toilet;Grab bars Toileting - Architect and Hygiene: Performed;Minimal assistance Where Assessed - Engineer, mining and Hygiene: Standing Equipment Used: Rolling walker Transfers/Ambulation Related to ADLs: Pt walked to bathroom with min guard.  Pt fatigues very quickly requiring many rest  breaks. ADL Comments: Oveall min assist due to fatigue.    OT Diagnosis: Generalized weakness;Acute pain  OT Problem List: Decreased activity tolerance;Decreased knowledge of use of DME or AE;Cardiopulmonary status limiting activity;Pain OT Treatment Interventions: Self-care/ADL training;Energy conservation;Therapeutic activities   OT Goals Acute Rehab OT Goals OT Goal Formulation: With patient Time For Goal Achievement: 12/24/11 Potential to Achieve Goals: Good ADL Goals Pt Will Perform Grooming: with modified independence;Standing at sink ADL Goal: Grooming - Progress: Goal set today Pt Will Perform Lower Body Bathing: with supervision;Sit to stand from chair;Standing at sink;Sitting at sink ADL Goal: Lower Body Bathing - Progress: Goal set today Pt Will Perform Lower Body Dressing: with supervision;Sit to stand from chair;Other (comment) (gathering own clothes using energy conservation techniques) ADL Goal: Lower Body Dressing - Progress: Goal set today Pt Will Perform Tub/Shower Transfer: Shower transfer;Shower seat with back;with supervision ADL Goal: Web designer - Progress: Goal set today Additional ADL Goal #1: Pt will complete all aspects of toileting with comfort commode and Mod I. ADL Goal: Additional Goal #1 - Progress: Goal set today  Visit Information  Last OT Received On: 12/10/11 Assistance Needed: +1    Subjective Data  Subjective: I am just so weak. Patient Stated Goal: to get my strength back.   Prior Functioning  Vision/Perception  Home Living Lives With: Alone Available Help at Discharge: Family;Available PRN/intermittently Type of Home: House Home Access: Level entry Home Layout: One level Bathroom Shower/Tub: Health visitor: Handicapped height Home Adaptive Equipment: Engineer, drilling - four wheeled;Straight cane Prior Function Level of Independence: Independent with assistive device(s) Able to Take Stairs?:  No Driving: Yes Vocation: Retired Musician: No difficulties Dominant Hand: Left   Vision - Assessment Vision Assessment: Vision not tested  Cognition  Overall Cognitive Status: Appears within functional limits for tasks assessed/performed  Arousal/Alertness: Awake/alert Orientation Level: Appears intact for tasks assessed Behavior During Session: [facility] for tasks performed Cognition - Other Comments: GRossly intact.    Extremity/Trunk Assessment Right Upper Extremity Assessment RUE ROM/Strength/Tone: WFL for tasks assessed RUE Sensation: WFL - Light Touch RUE Coordination: WFL - gross/fine motor Left Upper Extremity Assessment LUE ROM/Strength/Tone: WFL for tasks assessed LUE Sensation: WFL - Light Touch LUE Coordination: WFL - gross/fine motor   Mobility Bed Mobility Bed Mobility: Supine to Sit;Scooting to HOB Supine to Sit: 5: Supervision;HOB elevated;With rails Scooting to [facility]: 5: Supervision;With rail Details for Bed Mobility Assistance: Cues to attempt to do mobility on own.   Transfers Transfers: Sit to Stand;Stand to Sit Sit to Stand: 4: Min assist;With upper extremity assist;From bed Stand to Sit: 4: Min assist;With upper extremity assist;With armrests;To chair/3-in-1 Details for Transfer Assistance: steadying assist provided with sit to stand.     Exercise    Balance    End of Session OT - End of Session Activity Tolerance: Patient limited by fatigue;Patient limited by pain Patient left: in bed;with call bell/phone within reach Nurse Communication: Mobility status  GO     Hope Budds 12/10/2011, 9:06 AM [phone]

## 2011-12-10 NOTE — Progress Notes (Signed)
PT Cancellation Note  Treatment cancelled today due to patient politely declining due to fatigue.  Patient also about to start bath with CNA.  Will follow.  Tallyn Holroyd M 12/10/2011, 10:42 AM  12/10/2011 Cephus Shelling, PT, DPT [phone]

## 2011-12-10 NOTE — Progress Notes (Signed)
Physical Therapy Treatment Patient Details Name: Amy Bolton MRN: [id number] DOB: 11/05/1927 Today's Date: 12/10/2011 Time: 1350-1410 PT Time Calculation (min): 20 min  PT Assessment / Plan / Recommendation Comments on Treatment Session  Pt making progress with PT goals.  Very pleasant & motivated.      Follow Up Recommendations  Skilled nursing facility;Supervision/Assistance - 24 hour    Barriers to Discharge        Equipment Recommendations  Defer to next venue    Recommendations for Other Services    Frequency Min 3X/week   Plan Discharge plan remains appropriate    Precautions / Restrictions Precautions Precautions: Fall Restrictions Weight Bearing Restrictions: No     Pertinent Vitals/Pain No pain reported    Mobility  Bed Mobility Bed Mobility: Supine to Sit;Sitting - Scoot to Edge of Bed Supine to Sit: 4: Min assist;With rails;HOB flat Sitting - Scoot to Edge of Bed: 5: Supervision Details for Bed Mobility Assistance: Assist to lift shoulders/trunk to sitting upright.   Transfers Transfers: Sit to Stand;Stand to Sit Sit to Stand: 4: Min guard;With upper extremity assist;From bed Stand to Sit: 4: Min guard;With upper extremity assist;With armrests;To chair/3-in-1 Details for Transfer Assistance: cues for hand placement & technique Ambulation/Gait Ambulation/Gait Assistance: 4: Min guard Ambulation Distance (Feet): 200 Feet Assistive device: Rolling walker Ambulation/Gait Assistance Details: cues for tall posture.   Gait Pattern: Decreased stride length Stairs: No Wheelchair Mobility Wheelchair Mobility: No      PT Goals Acute Rehab PT Goals Time For Goal Achievement: 12/23/11 Potential to Achieve Goals: Good PT Goal: Supine/Side to Sit - Progress: Not met PT Goal: Sit to Stand - Progress: Progressing toward goal PT Goal: Ambulate - Progress: Progressing toward goal  Visit Information  Last PT Received On: 12/10/11 Assistance Needed: +1 Reason  Eval/Treat Not Completed: Patient refused (Due to fatigue this am.)    Subjective Data      Cognition  Overall Cognitive Status: Appears within functional limits for tasks assessed/performed Arousal/Alertness: Awake/alert Orientation Level: Appears intact for tasks assessed Behavior During Session: [facility] for tasks performed    Balance     End of Session PT - End of Session Equipment Utilized During Treatment: Gait belt Activity Tolerance: Patient tolerated treatment well Patient left: in chair;with call bell/phone within reach Nurse Communication: Mobility status    Verdell Face, [place] [phone] 12/10/2011

## 2011-12-10 NOTE — Progress Notes (Signed)
Cosigned for [employer] assessment, I/O, care plan/education, note(s). Lorretta Harp RN

## 2011-12-10 NOTE — Progress Notes (Signed)
Patient states that she is having stomach cramps and would like medication for it, MD on call notified and orders given; will continue to monitor patient  D. Manson Passey RN

## 2011-12-11 LAB — CBC
MCH: 31.1 pg (ref 26.0–34.0)
MCHC: 33.9 g/dL (ref 30.0–36.0)
MCV: 91.8 fL (ref 78.0–100.0)
Platelets: 168 10*3/uL (ref 150–400)
RDW: 14 % (ref 11.5–15.5)
WBC: 10.1 10*3/uL (ref 4.0–10.5)

## 2011-12-11 LAB — BASIC METABOLIC PANEL
BUN: 53 mg/dL — ABNORMAL HIGH (ref 6–23)
CO2: 28 mEq/L (ref 19–32)
Calcium: 8.3 mg/dL — ABNORMAL LOW (ref 8.4–10.5)
Creatinine, Ser: 1.34 mg/dL — ABNORMAL HIGH (ref 0.50–1.10)
Glucose, Bld: 97 mg/dL (ref 70–99)

## 2011-12-11 MED ORDER — CARVEDILOL 25 MG PO TABS: ORAL_TABLET | ORAL | Status: AC

## 2011-12-11 MED ORDER — ACETAMINOPHEN 325 MG PO TABS: 650.0000 mg | ORAL_TABLET | Freq: Four times a day (QID) | ORAL | Status: DC | PRN

## 2011-12-11 MED ORDER — LISINOPRIL 20 MG PO TABS: ORAL_TABLET | ORAL | Status: DC

## 2011-12-11 MED ORDER — HYDROCODONE-ACETAMINOPHEN 5-500 MG PO CAPS: 1.0000 | ORAL_CAPSULE | Freq: Three times a day (TID) | ORAL | Status: DC | PRN

## 2011-12-11 MED ORDER — LISINOPRIL 20 MG PO TABS
20.0000 mg | ORAL_TABLET | Freq: Every day | ORAL | Status: DC
  Administered 2011-12-11: 20 mg via ORAL
  Filled 2011-12-11: qty 1

## 2011-12-11 MED ORDER — ALPRAZOLAM 0.25 MG PO TABS: 0.2500 mg | ORAL_TABLET | Freq: Two times a day (BID) | ORAL | Status: DC | PRN

## 2011-12-11 MED ORDER — FUROSEMIDE 20 MG PO TABS: ORAL_TABLET | ORAL | Status: DC

## 2011-12-11 MED ORDER — FLUTICASONE PROPIONATE 50 MCG/ACT NA SUSP: 2.0000 | Freq: Every day | NASAL | Status: DC

## 2011-12-11 MED ORDER — CARVEDILOL 25 MG PO TABS: 25.0000 mg | ORAL_TABLET | Freq: Two times a day (BID) | ORAL | Status: DC

## 2011-12-11 NOTE — Progress Notes (Signed)
Physical Therapy Treatment Patient Details Name: Amy Bolton MRN: [id number] DOB: [birth year] Today's Date: 12/11/2011 Time: 8413-2440 PT Time Calculation (min): 15 min  PT Assessment / Plan / Recommendation Comments on Treatment Session  Initiated gait challenges while ambulating today.  Tolerated well.  Did not initiate Berg Balance Test due to pt's breakfast arriving.      Follow Up Recommendations  Skilled nursing facility;Supervision/Assistance - 24 hour    Barriers to Discharge        Equipment Recommendations  Defer to next venue    Recommendations for Other Services    Frequency Min 3X/week   Plan Discharge plan remains appropriate    Precautions / Restrictions Precautions Precautions: Fall Restrictions Weight Bearing Restrictions: No    Pertinent Vitals/Pain No pain reported    Mobility  Bed Mobility Bed Mobility: Not assessed (pt sitting EOB upon arrival) Transfers Transfers: Sit to Stand;Stand to Sit Sit to Stand: 5: Supervision;With upper extremity assist;From bed Stand to Sit: 5: Supervision;With upper extremity assist;With armrests;To chair/3-in-1 Details for Transfer Assistance: cues for safe hand placement Ambulation/Gait Ambulation/Gait Assistance: 5: Supervision Ambulation Distance (Feet): 250 Feet Assistive device: Rolling walker Ambulation/Gait Assistance Details: Pt performed various challenges such as gait speed changes, directional changes, stop/go on command, horizontal/vertical head turns in all directions.    Gait Pattern: Decreased stride length Gait velocity:  (not formally tested but decreased) Stairs: No Wheelchair Mobility Wheelchair Mobility: No      PT Goals Acute Rehab PT Goals Time For Goal Achievement: 12/23/11 Potential to Achieve Goals: Good PT Goal: Sit to Stand - Progress: Progressing toward goal PT Goal: Ambulate - Progress: Progressing toward goal  Visit Information  Last PT Received On: 12/11/11 Assistance Needed:  +1    Subjective Data      Cognition  Overall Cognitive Status: Appears within functional limits for tasks assessed/performed Arousal/Alertness: Awake/alert Orientation Level: Appears intact for tasks assessed Behavior During Session: [facility] for tasks performed    Balance  Balance Balance Assessed: Yes Static Standing Balance Static Standing - Balance Support: No upper extremity supported Static Standing - Level of Assistance: 5: Stand by assistance Static Standing - Comment/# of Minutes: Pt able to stand with eyes open/closed with no UE supported with SBA.  Sway noted after ~10 seconds.    End of Session PT - End of Session Equipment Utilized During Treatment: Gait belt Activity Tolerance: Patient tolerated treatment well Patient left: in chair;with call bell/phone within reach Nurse Communication: Mobility status    Verdell Face, [place] [phone] 12/11/2011

## 2011-12-11 NOTE — Discharge Summary (Signed)
Physician Discharge Summary  DISCHARGE SUMMARY   Patient ID: Amy Bolton MR#: [id number] DOB/AGE: 1927/06/02 78 y.o.   Attending Physician:Shanik Brookshire M  Patient's NFA:OZHYQ,MVHQ M, MD  Consults: none  Admit date: 12/08/2011 Discharge date: 12/11/2011  Discharge Diagnoses:  Principal Problem:  *Diastolic dysfunction Active Problems:  HTN (hypertension)  Volume overload  LVH (left ventricular hypertrophy)  Aortic stenosis, moderate  Hyperlipidemia  Hip pain  Adult failure to thrive  CKD (chronic kidney disease) stage 3, GFR 30-59 ml/min  CAD (coronary artery disease)  Obesity  Rash   Patient Active Problem List  Diagnosis  . HTN (hypertension)  . Diastolic dysfunction  . Volume overload  . LVH (left ventricular hypertrophy)  . Aortic stenosis, moderate  . Hyperlipidemia  . Hip pain  . Adult failure to thrive  . CKD (chronic kidney disease) stage 3, GFR 30-59 ml/min  . CAD (coronary artery disease)  . Obesity  . Rash   Past Medical History  Diagnosis Date  . Atypical chest pain   . Palpitation   . SOB (shortness of breath)     Episodic  . Hypertensive urgency     Episodic  . HTN (hypertension)   . Asthma     Underlying  . Kidney disease     Chronic, stage 3, with creatinine clearance estimated to be about 40  . Anxiety     Chronic  . Constipation   . GERD (gastroesophageal reflux disease)   . Hiatal hernia     Hx of  . Hyperlipidemia   . Osteoarthritis     Discharged Condition: stable   Discharge Medications: Medication List  As of 12/11/2011  8:05 AM   STOP taking these medications         valsartan 160 MG tablet         TAKE these medications         acetaminophen 325 MG tablet   Commonly known as: TYLENOL   Take 2 tablets (650 mg total) by mouth every 6 (six) hours as needed (or Fever >/= 101).      ALPRAZolam 0.25 MG tablet   Commonly known as: XANAX   Take 1 tablet (0.25 mg total) by mouth 2 (two) times daily as needed for  anxiety or sleep.      carvedilol 25 MG tablet   Commonly known as: COREG   Take 1 tablet (25 mg total) by mouth 2 (two) times daily with a meal.    Hold SBP < 105      cholecalciferol 1000 UNITS tablet   Commonly known as: VITAMIN D   Take 2,000 Units by mouth daily.      colesevelam 625 MG tablet   Commonly known as: WELCHOL   Take 1,250 mg by mouth 2 (two) times daily with a meal.      fluticasone 50 MCG/ACT nasal spray   Commonly known as: FLONASE   Place 2 sprays into the nose daily.      furosemide 20 MG tablet   Commonly known as: LASIX   One tablet daily - may go to [facility] if gets over - diuresed.      hydrocodone-acetaminophen 5-500 MG per capsule   Commonly known as: LORCET-HD   Take 1 capsule by mouth every 8 (eight) hours as needed for pain.      lisinopril 20 MG tablet   Commonly known as: PRINIVIL,ZESTRIL   One po Daily hold if SBP < 105      mirtazapine 30  MG tablet   Commonly known as: REMERON   Take 30 mg by mouth at bedtime.            Hospital Procedures: Dg Chest 2 View  12/08/2011  *RADIOLOGY REPORT*  Clinical Data: Chest pain and shortness of breath.  CHEST - 2 VIEW  Comparison: Chest x-ray 11/16/2009.  Findings: Lung volumes are normal.  No consolidative airspace disease.  No pleural effusions.  No pneumothorax.  No pulmonary nodule or mass noted.  Pulmonary vasculature and the cardiomediastinal silhouette are within normal limits. Atherosclerotic calcifications of the thoracic aorta.  Orthopedic fixation hardware in the lower cervical spine incidentally noted.  IMPRESSION: 1. No radiographic evidence of acute cardiopulmonary disease. 2.  Atherosclerosis.  Original Report Authenticated By: Florencia Reasons, M.D.   Mr Hip Right Wo Contrast  11/26/2011  *RADIOLOGY REPORT*  Clinical Data: Right hip pain extending down the right leg.  Fall 2 months ago.  MRI OF THE RIGHT HIP WITHOUT CONTRAST  Technique:  Multiplanar, multisequence MR imaging was  performed. No intravenous contrast was administered.  Comparison: None.  Findings: Degenerative arthropathy of the right hip noted with mild loss of articular space and disproportion spurring compared to the left side along the femoral head and acetabulum.  No findings of avascular necrosis, osseous edema, or stress fracture.  Proximal hamstring tendons appear normal and symmetric.  Trace left trochanteric bursitis noted.  On images 16-19 of series 6 there is irregularity the anterior superior acetabular labrum on the right, suspicious for labral tear, and extending inferiorly from the joint adjacent to the abductor musculature on the right, a 1.6 cm fluid signal intensity lesion may represent a paralabral cyst or simply a small extension of joint fluid.  Regional muscular atrophy noted without asymmetry.  Lower lumbar Ray cages present.  Sigmoid diverticula noted without active diverticulitis.  No pelvic adenopathy observed.  No sciatic notch impingement or asymmetry of the visualized sciatic nerves.  IMPRESSION:  1.  Asymmetric degenerative arthropathy of the right hip. 2.  Suspected tear of the anterior superior labrum.  There is also a fluid signal intensity structure extending caudad from the joint between the obturator internus and abductor musculature, possibly a paralabral cyst or small unusual extension of joint fluid. 3.  Sigmoid diverticulosis without active diverticulitis. 4.  No findings of fracture or avascular necrosis.  Original Report Authenticated By: Dellia Cloud, M.D.    History of Present Illness: 44 F S/P APE on July 17th who has known HTN-LVH/Aortic Stenosis. Hyperlipidemia, Anxiety, CKD Stage III, Secondary Hyperparathyroidism., Stress incontenance, GERD/Hiatal hernia, [facility] admission 02/2008., Allergic rhinitis (H/O immunotherapy w/Dr Corinda Gubler), Non-obstructive CAD 40 - 50% LAD stenosis 2002 and 2007. negative stress test 12/09., OA, Gout. H/O BCC, Viral gastroenteritis (2007),  Osteoporosis. Insomnia.  Fell on 5/14 and 7/16 and has been having severe right hip pain radiating down leg. Also some recent failure to thrive as her pain has been bad. She saw Chiro and Ortho - Dr Luiz Blare and had Steroid injections which did not help. S/P MRI on spine and hip. Was started on Lyrica - had mood changes and did poor on it. MRI of the hip showed: Suspected tear of the anterior superior labrum. There is also a fluid signal intensity structure extending caudad from the joint between the obturator internus and abductor musculature, possibly a paralabral cyst or small unusual extension of joint fluid. After the MRI she had a steroid injection in back and R Hip which helped 12-18 hrs. Dr  Graves does not feel she is a surgical candidate.  She stopped Welchol due to cost.  Recent increased Edema.  Brought to the ED 12/09/11 with progressive weakness and FTT. Increasing SOB and increasing DOE. Fatigue. Sxs progressive 2-4 weeks. She just saw Dr Cherylann Ratel her nephrologist who adjusted her Lasix. She had a rash and is also on a cream. She has had 3 days of increased leg edema as well.  Recent ECHO in May: - Left ventricle: The cavity size was normal. Wall thickness was increased in a pattern of moderate LVH. Systolic function was normal. Wall motion was normal; there were no regional wall motion abnormalities. Doppler parameters are consistent with abnormal left ventricular relaxation (grade 1 diastolic dysfunction). - Aortic valve: There was moderate stenosis. Mild regurgitation. Mean gradient: 26mm Hg (S). Valve area:1.37cm^2(VTI). - Aorta: Ascending aortic diameter: 38mm (S).  Denied any chest pain, fever, abdominal pain, confusion, lightheadedness, syncope, nausea, dysuria, increased frequency or urgency.  In ED labs were fine Cr 1.1. Pro BNP high. TI (-). EKG showed normal sinus rhythm with occasional PVC and nonspecific changes. CXR was clear. Dxed with CHF=Diastolic Dysfunction - given IV Lasix  and admitted.  When i admitted her she was urinating Often and lots of volume. No Foley needed. She was weak and tired. Wearing FIO2. Not SOB supine in bed.     Hospital Course: Admitted with Diastolic Dysfunction CHF, Vol Overload, FTT.  Doing better.  Less SOB. Sats have been fine. No LE edema post diuresis 2.5 days IV lasix and now converted to PO.  Working with PT currently and doing well.  No new c/o.  BP med held as BP too low currently post diuresis.   Diastolic Dysfunction - Doing Better post Diuresis. Cr stable. Now on PO lasix and may need QOD dosing. Deconditioning/FTT - Now has walker. Worked well with PT/OT.  [facility] today  FL-2 Signed.  HTN/LVH - Now low adjust meds. I lowered ACEi dosing and made it hold if SBP < 105.  We should also hold Coreg if SBP < 105  CKD 3 - Cr 1.32-1.34 and stable. Dr Filomena Jungling as an outpt. Follow on current meds.  Back and R hip pain - Known Labrum tear - PT and time and ortho prn.  1V CAD - Dr Elease Hashimoto. No angina.  Moderate AS - being followed by echos- trying not to over - diurese.  Obesity - needs wt loss.  AKs and "chemo" creams per Derm - with expected skin reaction.  DVT Prophylaxis = Lovenox in hospital given.. Mild Hyponatremia - NTD - Follow. (131-132) Leukocytosis - gone.  No infection noted.  Despite Low BP no Dizzy or Sxs.  To [employer] Today.     Day of Discharge Exam BP 99/47  Pulse 70  Temp 97.9 F (36.6 C) (Oral)  Resp 18  Ht 5\' 3"  (1.6 m)  Wt 80.831 kg (178 lb 3.2 oz)  BMI 31.57 kg/m2  SpO2 99%    Discharge Labs:  [facility] 12/11/11 0533 12/10/11 0549 12/09/11 0650  NA 132* 131* --  K 4.1 4.1 --  CL 96 95* --  CO2 28 22 --  GLUCOSE 97 95 --  BUN 53* 50* --  CREATININE 1.34* 1.32* --  CALCIUM 8.3* 8.7 --  MG -- -- 2.3  PHOS -- -- 3.7   No results found for this basename: AST:2,ALT:2,ALKPHOS:2,BILITOT:2,PROT:2,ALBUMIN:2 in the last 72 hours  Basename 12/11/11 0533 12/10/11 0549  WBC 10.1  10.6*  NEUTROABS -- --  HGB 12.1 13.9  HCT 35.7* 40.3  MCV 91.8 92.9  PLT 168 196   No results found for this basename: CKTOTAL:3,CKMB:3,CKMBINDEX:3,TROPONINI:3 in the last 72 hours  Basename 12/09/11 0103  TSH 0.636  T4TOTAL --  T3FREE --  THYROIDAB --   No results found for this basename: VITAMINB12:2,FOLATE:2,FERRITIN:2,TIBC:2,IRON:2,RETICCTPCT:2 in the last 72 hours Lab Results  Component Value Date   INR 0.9 10/21/2008       Discharge instructions: Discharge Orders    Future Appointments: Provider: Department: Dept Phone: Center:   04/27/2012 2:00 PM Vesta Mixer, MD [facility] [id number] [facility]         Disposition: SNF  Follow-up Appts: Follow-up with Dr. Timothy Lasso at [facility] in 2 weeks post D/C from SNF.  Call for appointment.  Condition on Discharge: stable  Tests Needing Follow-up: BMETs in the SNF  Time spent in discharge (includes decision making & examination of pt): 30 min  Signed: Anjolaoluwa Siguenza M 12/11/2011, 8:05 AM

## 2011-12-11 NOTE — Progress Notes (Signed)
Subjective: Admitted with Diastolic Dysfunction CHF, Vol Overload, FTT. Doing better. Less SOB. No LE edema, Working with PT currently and doing well. No new c/o. BP med held as BP too low currently post diuresis.  Objective: Vital signs in last 24 hours: Temp:  [97.3 F (36.3 C)-97.9 F (36.6 C)] 97.9 F (36.6 C) (08/02 0521) Pulse Rate:  [57-77] 70  (08/02 0521) Resp:  [18] 18  (08/02 0521) BP: (94-113)/(40-66) 99/47 mmHg (08/02 0521) SpO2:  [96 %-99 %] 99 % (08/02 0521) Weight:  [80.831 kg (178 lb 3.2 oz)] 80.831 kg (178 lb 3.2 oz) (08/02 0521) Weight change: 0.318 kg (11.2 oz) Last BM Date: 12/10/11  CBG (last 3)  No results found for this basename: GLUCAP:3 in the last 72 hours  Intake/Output from previous day:  Intake/Output Summary (Last 24 hours) at 12/11/11 0744 Last data filed at 12/11/11 0700  Gross per 24 hour  Intake    963 ml  Output   1002 ml  Net    -39 ml   08/01 0701 - 08/02 0700 In: 963 [P.O.:960; I.V.:3] Out: 1002 [Urine:1000; Stool:2]   Physical Exam General appearance: A and O Eyes: no scleral icterus Throat: oropharynx moist without erythema Resp: CTA Cardio: reg.  m 2/6 GI: soft, non-tender; bowel sounds normal; no masses,  no organomegaly Extremities: no clubbing, cyanosis or edema   Lab Results:  Basename 12/11/11 0533 12/10/11 0549 12/09/11 0650  NA 132* 131* --  K 4.1 4.1 --  CL 96 95* --  CO2 28 22 --  GLUCOSE 97 95 --  BUN 53* 50* --  CREATININE 1.34* 1.32* --  CALCIUM 8.3* 8.7 --  MG -- -- 2.3  PHOS -- -- 3.7    No results found for this basename: AST:2,ALT:2,ALKPHOS:2,BILITOT:2,PROT:2,ALBUMIN:2 in the last 72 hours   Basename 12/11/11 0533 12/10/11 0549  WBC 10.1 10.6*  NEUTROABS -- --  HGB 12.1 13.9  HCT 35.7* 40.3  MCV 91.8 92.9  PLT 168 196    Lab Results  Component Value Date   INR 0.9 10/21/2008    No results found for this basename: CKTOTAL:3,CKMB:3,CKMBINDEX:3,TROPONINI:3 in the last 72  hours   Basename 12/09/11 0103  TSH 0.636  T4TOTAL --  T3FREE --  THYROIDAB --    No results found for this basename: VITAMINB12:2,FOLATE:2,FERRITIN:2,TIBC:2,IRON:2,RETICCTPCT:2 in the last 72 hours  Micro Results: No results found for this or any previous visit (from the past 240 hour(s)).   Studies/Results: No results found.   Medications: Scheduled:    . antiseptic oral rinse  15 mL Mouth Rinse BID  . carvedilol  25 mg Oral BID WC  . cholecalciferol  2,000 Units Oral Daily  . colesevelam  1,250 mg Oral BID WC  . enoxaparin (LOVENOX) injection  30 mg Subcutaneous Q24H  . fluticasone  2 spray Each Nare Daily  . furosemide  20 mg Intravenous Daily  . furosemide  20 mg Oral Daily  . lisinopril  40 mg Oral Daily  . mirtazapine  30 mg Oral QHS  . sodium chloride  3 mL Intravenous Q12H  . sodium chloride  3 mL Intravenous Q12H   Continuous:    Assessment/Plan: Principal Problem:  *Diastolic dysfunction Active Problems:  HTN (hypertension)  Volume overload  LVH (left ventricular hypertrophy)  Aortic stenosis, moderate  Hyperlipidemia  Hip pain  Adult failure to thrive  CKD (chronic kidney disease) stage 3, GFR 30-59 ml/min  CAD (coronary artery disease)  Obesity  Rash  Diastolic Dysfunction -  Doing Better post Diuresis.  Cr stable.  Now on PO lasix. Watch BP and adjust meds prn. Deconditioning/FTT - Now has walker.  Working well with PT. [facility] today FL-2 Signed. HTN/LVH - Now low adjust meds. CKD 3 - Cr 1.32-1.34 and stable. Dr Filomena Jungling as an outpt. Follow on current meds. Back and R hip pain - Known Labrum tear - PT and time and ortho prn. 1V CAD - Dr Elease Hashimoto.  No angina. Moderate AS - being followed by echos- trying not to over - diurese. Obesity - needs wt loss. AKs and "chemo" creams per Derm - with expected skin reaction. DVT Prophylaxis = Lovenox.  To [employer] Today.    LOS: 3 days   Hadden Steig M 12/11/2011, 7:44 AM

## 2011-12-11 NOTE — Progress Notes (Signed)
Clinical social worker assisted with patient discharge to skilled nursing facility, [facility].  CSW addressed all family questions and concerns. CSW copied chart and added all important documents. CSW also set up patient transportation with [facility]. Clinical Social Worker will sign off for now as social work intervention is no longer needed.  

## 2011-12-11 NOTE — Progress Notes (Signed)
Nursing Note: Pt discharged per MD's order. All of pt's discharge information put into yellow packet to be sent to [facility] with pt. IV and cardiac monitor discontinued. Pt transported via ambulance. Report called to receiving nurse.  Jjesus Dingley Scientist, clinical (histocompatibility and immunogenetics).

## 2012-01-18 ENCOUNTER — Telehealth: Payer: Self-pay | Admitting: Cardiovascular Disease

## 2012-01-18 NOTE — Telephone Encounter (Signed)
Pt has been in hospital due to fall with hip injury with therapy that followed, pt complains of continued swelling of her feet and wants to be seen. Pt given app this week with Norma Fredrickson NP.

## 2012-01-18 NOTE — Telephone Encounter (Signed)
Pt has developed heart failure, feet are still swelling, pls advise

## 2012-01-22 ENCOUNTER — Ambulatory Visit (INDEPENDENT_AMBULATORY_CARE_PROVIDER_SITE_OTHER): Payer: Medicare Other | Admitting: Nurse Practitioner

## 2012-01-22 ENCOUNTER — Encounter: Payer: Self-pay | Admitting: Nurse Practitioner

## 2012-01-22 VITALS — BP 114/64 | HR 68 | Ht 63.0 in | Wt 185.0 lb

## 2012-01-22 DIAGNOSIS — I5032 Chronic diastolic (congestive) heart failure: Secondary | ICD-10-CM

## 2012-01-22 DIAGNOSIS — R5381 Other malaise: Secondary | ICD-10-CM

## 2012-01-22 DIAGNOSIS — R5383 Other fatigue: Secondary | ICD-10-CM

## 2012-01-22 LAB — CBC WITH DIFFERENTIAL/PLATELET
Basophils Absolute: 0 10*3/uL (ref 0.0–0.1)
Basophils Relative: 0.4 % (ref 0.0–3.0)
Eosinophils Absolute: 0.2 10*3/uL (ref 0.0–0.7)
Eosinophils Relative: 1.8 % (ref 0.0–5.0)
HCT: 36.7 % (ref 36.0–46.0)
Hemoglobin: 11.8 g/dL — ABNORMAL LOW (ref 12.0–15.0)
Lymphocytes Relative: 22.3 % (ref 12.0–46.0)
Lymphs Abs: 2.3 10*3/uL (ref 0.7–4.0)
MCHC: 32.1 g/dL (ref 30.0–36.0)
MCV: 99.3 fl (ref 78.0–100.0)
Monocytes Absolute: 1 10*3/uL (ref 0.1–1.0)
Monocytes Relative: 9.4 % (ref 3.0–12.0)
Neutro Abs: 6.9 10*3/uL (ref 1.4–7.7)
Neutrophils Relative %: 66.1 % (ref 43.0–77.0)
Platelets: 286 10*3/uL (ref 150.0–400.0)
RBC: 3.7 Mil/uL — ABNORMAL LOW (ref 3.87–5.11)
RDW: 14.4 % (ref 11.5–14.6)
WBC: 10.4 10*3/uL (ref 4.5–10.5)

## 2012-01-22 LAB — BASIC METABOLIC PANEL
BUN: 28 mg/dL — ABNORMAL HIGH (ref 6–23)
CO2: 27 mEq/L (ref 19–32)
Calcium: 9.1 mg/dL (ref 8.4–10.5)
Chloride: 100 mEq/L (ref 96–112)
Creatinine, Ser: 1.2 mg/dL (ref 0.4–1.2)
GFR: 44.37 mL/min — ABNORMAL LOW (ref >60.00)
Glucose, Bld: 92 mg/dL (ref 70–99)
Potassium: 4.8 mEq/L (ref 3.5–5.1)
Sodium: 136 mEq/L (ref 135–145)

## 2012-01-22 LAB — TSH: TSH: 0.13 u[IU]/mL — ABNORMAL LOW (ref 0.35–5.50)

## 2012-01-22 MED ORDER — FUROSEMIDE 40 MG PO TABS: 20.0000 mg | ORAL_TABLET | Freq: Every day | ORAL | Status: DC

## 2012-01-22 NOTE — Patient Instructions (Addendum)
We are going to check some labs today  Increase your Lasix to a whole tablet every day  I will see you in 3 weeks.  Wear your support stockings every day  Watch your salt.   Call the [facility] office at [phone] if you have any questions, problems or concerns.

## 2012-01-22 NOTE — Progress Notes (Signed)
Vendela Troung Deitrick Date of Birth: 1928-03-14 Medical Record #[id number]  History of Present Illness: [name] is seen today for a work in visit. She is seen for Dr. Elease Hashimoto. She has a history of moderate AS per echo back in May, diastolic dysfunction, CRI, HLD, HTN, chronic back/hip pain and minimal CAD per cath back in 2007.   She comes in today. She is here with her daughter. She continues to have issues with her hip. She had a recent flare up of her CHF back in July. She continues to have swelling. Has issues with her kidney function as well. Her diuretics have been cut back. Her swelling is worse. She is upset that she cannot do all the things like she could a few years ago. She is actually pretty tearful about this issue. Says she is more SOB with the warm weather but is able to walk some when it is cooler in the evenings. She will have some occasional chest pains. She is not passing out. She stopped using her support stockings because her legs were a little discolored and the therapist told her to stop. She is using salt. Was actually wanting to stop by the ham store on the way to the beach next week. She has had some falls. Her family has her drinking "tons" of water.   Current Outpatient Prescriptions on File Prior to Visit  Medication Sig Dispense Refill  . acetaminophen (TYLENOL) 325 MG tablet Take 2 tablets (650 mg total) by mouth every 6 (six) hours as needed (or Fever >/= 101).      . carvedilol (COREG) 25 MG tablet Take 1 tablet (25 mg total) by mouth 2 (two) times daily with a meal.   Hold SBP < 105  60 tablet  3  . cholecalciferol (VITAMIN D) 1000 UNITS tablet Take 2,000 Units by mouth daily.       . colesevelam (WELCHOL) 625 MG tablet Take 1,250 mg by mouth 2 (two) times daily with a meal.      . fluticasone (FLONASE) 50 MCG/ACT nasal spray Place 2 sprays into the nose daily.      Marland Kitchen lisinopril (PRINIVIL,ZESTRIL) 20 MG tablet One po Daily hold if SBP < 105      . mirtazapine (REMERON)  30 MG tablet Take 30 mg by mouth at bedtime.      . ALPRAZolam (XANAX) 0.25 MG tablet Take 1 tablet (0.25 mg total) by mouth 2 (two) times daily as needed for anxiety or sleep.  30 tablet    . hydrocodone-acetaminophen (LORCET-HD) 5-500 MG per capsule Take 1 capsule by mouth every 8 (eight) hours as needed for pain.  30 capsule  0  . DISCONTD: furosemide (LASIX) 20 MG tablet One tablet daily - may go to [facility] if gets over - diuresed.  30 tablet      Allergies  Allergen Reactions  . Ampicillin     Unknown  . Penicillins     Unknown    Past Medical History  Diagnosis Date  . Atypical chest pain   . Palpitation   . SOB (shortness of breath)     Episodic  . Hypertensive urgency     Episodic  . HTN (hypertension)   . Asthma     Underlying  . Kidney disease     Chronic, stage 3, with creatinine clearance estimated to be about 40  . Anxiety     Chronic  . Constipation   . GERD (gastroesophageal reflux disease)   .  Hiatal hernia     Hx of  . Hyperlipidemia   . Osteoarthritis     Past Surgical History  Procedure Date  . US echocardiography 10-12-08    2-D done at [facility] on October 12, 2008 showed some diastolic dysfunction, EF 55%, mild tricuspid regurgitation, right ventricular systolic pressure elevated a 40-50 mmHg, mild valvular aortic stenosis noted.  . Cardiovascular stress test 04-19-2008    showing EF 69% with otherwise a normal stress nuclear study  . Tonsillectomy   . Adenoidectomy   . Spine surgery     2001, lumbar at the L4-5 level; in 2004, C-spine operation with fusion at the  C5-6 level; in 2004, left thumb CMC joint operation; in 2005 bilateral rotator cuff surgeries  . Bladder suspension     surgery  . Cardiac catheterization 01-2006    showing minimal proximal left anterior descending artery disease. showing less than 50% stenosis of the proximal LAD and otherwise normal coronary arteries,  . Back surgery     x2    History  Smoking  status  . Never Smoker   Smokeless tobacco  . Not on file  Comment: Quit 47yrs    History  Alcohol Use No    History reviewed. No pertinent family history.  Review of Systems: The review of systems is per the HPI.  All other systems were reviewed and are negative.  Physical Exam: BP 114/64  Pulse 68  Ht 5\' 3"  (1.6 m)  Wt 185 lb (83.915 kg)  BMI 32.77 kg/m2 Her weight is up 7 pounds since her last visit here in June.  Patient is very pleasant and in no acute distress. She is a little tearful today. Skin is warm and dry. Color is normal.  HEENT is unremarkable. Normocephalic/atraumatic. PERRL. Sclera are nonicteric. Neck is supple. No masses. No JVD. Lungs are clear. Cardiac exam shows a regular rate and rhythm. She has a harsh outflow murmur. Abdomen is obese but soft. Extremities are with 1+ edema. Some venous stasis changes noted. Gait and ROM are intact. No gross neurologic deficits noted.   LABORATORY DATA:  Lab Results  Component Value Date   WBC 10.1 12/11/2011   HGB 12.1 12/11/2011   HCT 35.7* 12/11/2011   PLT 168 12/11/2011   GLUCOSE 97 12/11/2011   ALT 17 10/23/2008   AST 22 10/23/2008   NA 132* 12/11/2011   K 4.1 12/11/2011   CL 96 12/11/2011   CREATININE 1.34* 12/11/2011   BUN 53* 12/11/2011   CO2 28 12/11/2011   TSH 0.636 12/09/2011   INR 0.9 10/21/2008   MICROALBUR 0.50 10/22/2008    Assessment / Plan: 1. Edema - I suspect this is multifactorial. She is using too much salt. We discussed the need for sodium restriction. She is to start using her support stockings again. Will cut back on the water intake. Her echo was done in May and showed normal LV function with grade 1 diastolic dysfunction.   2. DOE - I suspect is also multifactorial. We are checking labs today. Lasix is increased to 40 mg a day  3. Moderate AS - she had her echo in May. If we can't get her turned up a little better, may need to repeat.   I think overall, she is starting a general decline medically. She is  quite frustrated that she can no longer do all the things like she could when she was younger. Will see if we can't get her swelling  better with the lasix. She really needs to cut back on the sodium. Restart the support stockings. We are checking labs today. I will see her back in about 3 weeks.   Patient is agreeable to this plan and will call if any problems develop in the interim.

## 2012-01-25 ENCOUNTER — Telehealth: Payer: Self-pay | Admitting: *Deleted

## 2012-01-25 NOTE — Telephone Encounter (Signed)
Message copied by Awilda Bill on Mon Jan 25, 2012  1:34 PM ------      Message from: Rosalio Macadamia      Created: Fri Jan 22, 2012  4:17 PM       Ok to report. Labs are satisfactory except for the TSH. Please send copy to her PCP. Will need repeat with PCP.

## 2012-01-25 NOTE — Telephone Encounter (Signed)
LMTCB

## 2012-01-28 ENCOUNTER — Encounter: Payer: Self-pay | Admitting: *Deleted

## 2012-01-28 NOTE — Telephone Encounter (Signed)
F/u  Pt daughter Kendal Hymen [phone] returning nurse call, she has questionst

## 2012-02-15 ENCOUNTER — Encounter: Payer: Self-pay | Admitting: Nurse Practitioner

## 2012-02-15 ENCOUNTER — Ambulatory Visit (INDEPENDENT_AMBULATORY_CARE_PROVIDER_SITE_OTHER): Payer: Medicare Other | Admitting: Nurse Practitioner

## 2012-02-15 VITALS — BP 118/64 | HR 75 | Ht 63.0 in | Wt 183.4 lb

## 2012-02-15 DIAGNOSIS — R609 Edema, unspecified: Secondary | ICD-10-CM

## 2012-02-15 NOTE — Patient Instructions (Signed)
Call Dr. Timothy Lasso about your thyroid and hair loss  [employer] in [place] to look at support stockings  Dr. Elease Hashimoto will see you in December  Call the [facility] office at [phone] if you have any questions, problems or concerns.

## 2012-02-15 NOTE — Progress Notes (Signed)
Amy Bolton Date of Birth: 29-May-1927 Medical Record #[id number]  History of Present Illness: Amy Bolton is seen back today for a 3 week check. She is seen for Dr. Elease Hashimoto. She has a history of moderate AS per echo back in May, diastolic dysfunction, CRI, HLD, HTN, chronic back/hip pain and minimal CAD per cath back in 2007.   I saw her 3 weeks ago. She was pretty depressed about her general decline physically. She was having more swelling and we resumed her regular dose of Lasix.   She comes in today. She is here with her daughter. She says she is doing better. Has cut back on her salt intake considerably. She is trying to wear support stockings but notes it is hard to put them on due to her arthritis. She is getting some physical therapy and this seems to have helped. Not having dyspnea, chest pain or syncope. She is more worried about her hair falling out and thinks it is due to her thyroid levels.   Current Outpatient Prescriptions on File Prior to Visit  Medication Sig Dispense Refill  . acetaminophen (TYLENOL) 325 MG tablet Take 2 tablets (650 mg total) by mouth every 6 (six) hours as needed (or Fever >/= 101).      Marland Kitchen ALPRAZolam (XANAX) 0.25 MG tablet Take 0.25 mg by mouth 3 (three) times daily as needed.       . carvedilol (COREG) 25 MG tablet Take 1 tablet (25 mg total) by mouth 2 (two) times daily with a meal.   Hold SBP < 105  60 tablet  3  . cholecalciferol (VITAMIN D) 1000 UNITS tablet Take 2,000 Units by mouth daily.       . colesevelam (WELCHOL) 625 MG tablet Take 1,250 mg by mouth 2 (two) times daily with a meal.      . fluticasone (FLONASE) 50 MCG/ACT nasal spray Place 2 sprays into the nose daily.      . furosemide (LASIX) 40 MG tablet Take 0.5 tablets (20 mg total) by mouth daily.  30 tablet  6  . HYDROcodone-acetaminophen (VICODIN) 5-500 MG per tablet       . lisinopril (PRINIVIL,ZESTRIL) 20 MG tablet One po Daily hold if SBP < 105      . mirtazapine (REMERON) 30 MG tablet  Take 30 mg by mouth at bedtime.      Marland Kitchen DISCONTD: ALPRAZolam (XANAX) 0.25 MG tablet Take 1 tablet (0.25 mg total) by mouth 2 (two) times daily as needed for anxiety or sleep.  30 tablet    . DISCONTD: hydrocodone-acetaminophen (LORCET-HD) 5-500 MG per capsule Take 1 capsule by mouth every 8 (eight) hours as needed for pain.  30 capsule  0    Allergies  Allergen Reactions  . Ampicillin     Unknown  . Penicillins     Unknown    Past Medical History  Diagnosis Date  . Atypical chest pain   . Palpitation   . SOB (shortness of breath)     Episodic  . Hypertensive urgency     Episodic  . HTN (hypertension)   . Asthma     Underlying  . Kidney disease     Chronic, stage 3, with creatinine clearance estimated to be about 40  . Anxiety     Chronic  . Constipation   . GERD (gastroesophageal reflux disease)   . Hiatal hernia     Hx of  . Hyperlipidemia   . Osteoarthritis     Past Surgical  History  Procedure Date  . US echocardiography 10-12-08    2-D done at [facility] on October 12, 2008 showed some diastolic dysfunction, EF 55%, mild tricuspid regurgitation, right ventricular systolic pressure elevated a 40-50 mmHg, mild valvular aortic stenosis noted.  . Cardiovascular stress test 04-19-2008    showing EF 69% with otherwise a normal stress nuclear study  . Tonsillectomy   . Adenoidectomy   . Spine surgery     2001, lumbar at the L4-5 level; in 2004, C-spine operation with fusion at the  C5-6 level; in 2004, left thumb CMC joint operation; in 2005 bilateral rotator cuff surgeries  . Bladder suspension     surgery  . Cardiac catheterization 01-2006    showing minimal proximal left anterior descending artery disease. showing less than 50% stenosis of the proximal LAD and otherwise normal coronary arteries,  . Back surgery     x2    History  Smoking status  . Never Smoker   Smokeless tobacco  . Not on file  Comment: Quit 59yrs    History  Alcohol Use No     History reviewed. No pertinent family history.  Review of Systems: The review of systems is per the HPI.  All other systems were reviewed and are negative.  Physical Exam: BP 118/64  Pulse 75  Ht 5\' 3"  (1.6 m)  Wt 183 lb 6.4 oz (83.19 kg)  BMI 32.49 kg/m2 Weight is down 2 pounds Patient is very pleasant and in no acute distress. Skin is warm and dry. Color is normal.  HEENT is unremarkable. Normocephalic/atraumatic. PERRL. Sclera are nonicteric. Neck is supple. No masses. No JVD. Lungs are clear. Cardiac exam shows a regular rate and rhythm. Harsh outflow murmur. Abdomen is soft. Extremities are with trace edema, left greater than right which is chronic. Gait and ROM are intact. She is using her cane.  No gross neurologic deficits noted.  LABORATORY DATA:  Lab Results  Component Value Date   WBC 10.4 01/22/2012   HGB 11.8* 01/22/2012   HCT 36.7 01/22/2012   PLT 286.0 01/22/2012   GLUCOSE 92 01/22/2012   ALT 17 10/23/2008   AST 22 10/23/2008   NA 136 01/22/2012   K 4.8 01/22/2012   CL 100 01/22/2012   CREATININE 1.2 01/22/2012   BUN 28* 01/22/2012   CO2 27 01/22/2012   TSH 0.13* 01/22/2012   INR 0.9 10/21/2008   MICROALBUR 0.50 10/22/2008    Assessment / Plan: 1. Edema - seems improved with her current regimen. I told her about another patient who bought some support stockings from "[employer]" in [place] which seem to work better in regards to putting them on.   2. DOE - seems resolved at this time.   3. Moderate AS - no cardinal symptoms reported.  4. Hair loss - she will discuss with Dr. Timothy Lasso.   She will see Dr. Elease Hashimoto back in December. Check labs on return. Encouraged her to continue with her sodium restriction.   Patient is agreeable to this plan and will call if any problems develop in the interim.

## 2012-03-07 ENCOUNTER — Ambulatory Visit: Payer: Self-pay | Admitting: Vascular Surgery

## 2012-03-07 HISTORY — PX: ILIAC ARTERY STENT: SHX1786

## 2012-03-07 LAB — CREATININE, SERUM
Creatinine: 1.12 mg/dL (ref 0.60–1.30)
EGFR (African American): 53 — ABNORMAL LOW
EGFR (Non-African Amer.): 46 — ABNORMAL LOW

## 2012-03-07 LAB — BUN: BUN: 23 mg/dL — ABNORMAL HIGH (ref 7–18)

## 2012-04-17 ENCOUNTER — Ambulatory Visit: Payer: Self-pay | Admitting: Internal Medicine

## 2012-04-27 ENCOUNTER — Ambulatory Visit (INDEPENDENT_AMBULATORY_CARE_PROVIDER_SITE_OTHER): Payer: Medicare Other | Admitting: Cardiovascular Disease

## 2012-04-27 ENCOUNTER — Encounter: Payer: Self-pay | Admitting: Cardiovascular Disease

## 2012-04-27 VITALS — BP 132/70 | HR 81 | Ht 63.0 in | Wt 183.0 lb

## 2012-04-27 DIAGNOSIS — N816 Rectocele: Secondary | ICD-10-CM | POA: Insufficient documentation

## 2012-04-27 DIAGNOSIS — I10 Essential (primary) hypertension: Secondary | ICD-10-CM

## 2012-04-27 DIAGNOSIS — I519 Heart disease, unspecified: Secondary | ICD-10-CM

## 2012-04-27 DIAGNOSIS — I5189 Other ill-defined heart diseases: Secondary | ICD-10-CM

## 2012-04-27 DIAGNOSIS — I350 Nonrheumatic aortic (valve) stenosis: Secondary | ICD-10-CM

## 2012-04-27 DIAGNOSIS — I359 Nonrheumatic aortic valve disorder, unspecified: Secondary | ICD-10-CM

## 2012-04-27 DIAGNOSIS — I2510 Atherosclerotic heart disease of native coronary artery without angina pectoris: Secondary | ICD-10-CM

## 2012-04-27 NOTE — Assessment & Plan Note (Signed)
Amy Bolton has had a rough year. She's had lots of complications.  She was admitted with congestive heart failure. She stenting of her iliac arteries. She has moderate aortic stenosis and I don't think that her aortic stenosis is continuing to this generalized failure to thrive.

## 2012-04-27 NOTE — Patient Instructions (Addendum)
Call Dr. Abbey Chatters at [facility] .  [address], [place], [place] [address] [phone]  Your physician wants you to follow-up in: 6 months with Dr. Elease Hashimoto. You will receive a reminder letter in the mail two months in advance. If you don't receive a letter, please call our office to schedule the follow-up appointment.

## 2012-04-27 NOTE — Assessment & Plan Note (Signed)
She has been having lots of complications with a rectocele.  I have recommended that she go to see Dr.  Abbey Chatters at [facility].

## 2012-04-27 NOTE — Progress Notes (Signed)
Amy Bolton Viens Date of Birth  February 25, 1928 [facility]     [place]  [address]    [address]   [address] [place], [place]  [address]    [place], [place]  [address] [phone]  Fax  [phone]  [phone]  Fax [phone]  Problem list: 1. Aortic stenosis - Echo May 2013 - moderate AS , mean gradient of 26 2. Diastolic dysfunction 3. Renal insufficiency 4. Hyperlipidemia-intolerant to statin therapy 5. Minimal coronary artery disease by cardiac cath 2007 6 Hypertension 7. Chronic back pain Peripheral vascular disease - s/p stenting (Dr. Wyn Bolton at [facility] )  History of Present Illness:  Amy Bolton is a 78 y.o. female with hx of diastolic dysfunction, mild aortic stenosis, and mild CAD.  She walks regularly.    She fell and injured her leg several months ago. She is scheduled have an an MRI of her leg and back later today.  Dec. 18, 2013 She has had a rough year.  She fell this past spring and injured her back.  She has had multiple falling episodes.  She was admitted with CHF which resulted in a SNF stay.   She had stenting of her iliac arteries.  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  Current Outpatient Prescriptions on File Prior to Visit  Medication Sig Dispense Refill  . acetaminophen (TYLENOL) 325 MG tablet Take 2 tablets (650 mg total) by mouth every 6 (six) hours as needed (or Fever >/= 101).      Marland Kitchen ALPRAZolam (XANAX) 0.25 MG tablet Take 0.25 mg by mouth 3 (three) times daily as needed.       . carvedilol (COREG) 25 MG tablet Take 1 tablet (25 mg total) by mouth 2 (two) times daily with a meal.   Hold SBP < 105  60 tablet  3  . cholecalciferol (VITAMIN D) 1000 UNITS tablet Take 2,000 Units by mouth daily.       . colesevelam (WELCHOL) 625 MG tablet Take 1,250 mg by mouth 2 (two) times daily with a meal.      . fluticasone (FLONASE) 50 MCG/ACT nasal spray Place 2 sprays into the nose daily.      . furosemide (LASIX) 40 MG tablet Take 0.5  tablets (20 mg total) by mouth daily.  30 tablet  6  . HYDROcodone-acetaminophen (VICODIN) 5-500 MG per tablet       . lisinopril (PRINIVIL,ZESTRIL) 20 MG tablet One po Daily hold if SBP < 105      . mirtazapine (REMERON) 30 MG tablet Take 30 mg by mouth at bedtime.        Allergies  Allergen Reactions  . Ampicillin     Unknown  . Penicillins     Unknown    Past Medical History  Diagnosis Date  . Atypical chest pain   . Palpitation   . SOB (shortness of breath)     Episodic  . Hypertensive urgency     Episodic  . HTN (hypertension)   . Asthma     Underlying  . Kidney disease     Chronic, stage 3, with creatinine clearance estimated to be about 40  . Anxiety     Chronic  . Constipation   . GERD (gastroesophageal reflux disease)   . Hiatal hernia     Hx of  . Hyperlipidemia   . Osteoarthritis   . CHF (congestive heart failure)   . PVD (peripheral vascular disease)   .  AAA (abdominal aortic aneurysm)     Past Surgical History  Procedure Date  . US echocardiography 10-12-08    2-D done at [facility] on October 12, 2008 showed some diastolic dysfunction, EF 55%, mild tricuspid regurgitation, right ventricular systolic pressure elevated a 40-50 mmHg, mild valvular aortic stenosis noted.  . Cardiovascular stress test 04-19-2008    showing EF 69% with otherwise a normal stress nuclear study  . Tonsillectomy   . Adenoidectomy   . Spine surgery     2001, lumbar at the L4-5 level; in 2004, C-spine operation with fusion at the  C5-6 level; in 2004, left thumb CMC joint operation; in 2005 bilateral rotator cuff surgeries  . Bladder suspension     surgery  . Cardiac catheterization 01-2006    showing minimal proximal left anterior descending artery disease. showing less than 50% stenosis of the proximal LAD and otherwise normal coronary arteries,  . Back surgery     x2  . Iliac artery stent 03/07/2012    History  Smoking status  . Never Smoker    Smokeless tobacco  . Not on file    Comment: Quit 45yrs    History  Alcohol Use No    History reviewed. No pertinent family history.  Reviw of Systems:  Reviewed in the HPI.  All other systems are negative.  Physical Exam: Blood pressure 132/70, pulse 81, height 5\' 3"  (1.6 m), weight 183 lb (83.008 kg). General: Well developed, well nourished, in no acute distress.  Head: Normocephalic, atraumatic, sclera non-icteric, mucus membranes are moist,   Neck: Supple. Carotids are 2 + with radiation of her systolic murmur into her carotid arteries.  No JVD  Lungs: Clear bilaterally to auscultation.  Heart: regular rate.  normal  S1 S2. There is a 1-2/6 systolic murmur at the left sternal border.  Abdomen: Soft, non-tender, non-distended with normal bowel sounds. No hepatomegaly. No rebound/guarding. No masses.  Msk:  Strength and tone are normal  Extremities: No clubbing or cyanosis. No edema.  Distal pedal pulses are 2+ and equal bilaterally.  Neuro: Alert and oriented X 3. Moves all extremities spontaneously.  Psych:  Responds to questions appropriately with a normal affect.  ECG:  Assessment / Plan:

## 2012-04-27 NOTE — Assessment & Plan Note (Signed)
Blood pressure is well-controlled. Continue with her same medications.

## 2012-05-14 ENCOUNTER — Emergency Department (HOSPITAL_COMMUNITY): Payer: Medicare Other

## 2012-05-14 ENCOUNTER — Inpatient Hospital Stay (HOSPITAL_COMMUNITY)
Admission: EM | Admit: 2012-05-14 | Discharge: 2012-05-16 | DRG: 312 | Disposition: A | Discharge status: Home / Self Care | Payer: Medicare Other | Attending: Internal Medicine | Admitting: Internal Medicine

## 2012-05-14 ENCOUNTER — Encounter (HOSPITAL_COMMUNITY): Payer: Self-pay | Admitting: *Deleted

## 2012-05-14 DIAGNOSIS — R55 Syncope and collapse: Principal | ICD-10-CM | POA: Diagnosis present

## 2012-05-14 DIAGNOSIS — R627 Adult failure to thrive: Secondary | ICD-10-CM | POA: Diagnosis present

## 2012-05-14 DIAGNOSIS — I509 Heart failure, unspecified: Secondary | ICD-10-CM | POA: Diagnosis present

## 2012-05-14 DIAGNOSIS — E785 Hyperlipidemia, unspecified: Secondary | ICD-10-CM | POA: Diagnosis present

## 2012-05-14 DIAGNOSIS — Z79899 Other long term (current) drug therapy: Secondary | ICD-10-CM

## 2012-05-14 DIAGNOSIS — I10 Essential (primary) hypertension: Secondary | ICD-10-CM | POA: Diagnosis present

## 2012-05-14 DIAGNOSIS — R7309 Other abnormal glucose: Secondary | ICD-10-CM | POA: Diagnosis present

## 2012-05-14 DIAGNOSIS — K5900 Constipation, unspecified: Secondary | ICD-10-CM | POA: Diagnosis present

## 2012-05-14 DIAGNOSIS — K219 Gastro-esophageal reflux disease without esophagitis: Secondary | ICD-10-CM | POA: Diagnosis present

## 2012-05-14 DIAGNOSIS — N183 Chronic kidney disease, stage 3 unspecified: Secondary | ICD-10-CM | POA: Diagnosis present

## 2012-05-14 DIAGNOSIS — I2510 Atherosclerotic heart disease of native coronary artery without angina pectoris: Secondary | ICD-10-CM | POA: Diagnosis present

## 2012-05-14 DIAGNOSIS — I739 Peripheral vascular disease, unspecified: Secondary | ICD-10-CM

## 2012-05-14 DIAGNOSIS — M1990 Unspecified osteoarthritis, unspecified site: Secondary | ICD-10-CM | POA: Diagnosis present

## 2012-05-14 DIAGNOSIS — I359 Nonrheumatic aortic valve disorder, unspecified: Secondary | ICD-10-CM | POA: Diagnosis present

## 2012-05-14 DIAGNOSIS — I350 Nonrheumatic aortic (valve) stenosis: Secondary | ICD-10-CM | POA: Diagnosis present

## 2012-05-14 DIAGNOSIS — J45909 Unspecified asthma, uncomplicated: Secondary | ICD-10-CM | POA: Diagnosis present

## 2012-05-14 DIAGNOSIS — Z8701 Personal history of pneumonia (recurrent): Secondary | ICD-10-CM

## 2012-05-14 DIAGNOSIS — R109 Unspecified abdominal pain: Secondary | ICD-10-CM

## 2012-05-14 DIAGNOSIS — F411 Generalized anxiety disorder: Secondary | ICD-10-CM | POA: Diagnosis present

## 2012-05-14 DIAGNOSIS — E876 Hypokalemia: Secondary | ICD-10-CM | POA: Diagnosis present

## 2012-05-14 DIAGNOSIS — I129 Hypertensive chronic kidney disease with stage 1 through stage 4 chronic kidney disease, or unspecified chronic kidney disease: Secondary | ICD-10-CM | POA: Diagnosis present

## 2012-05-14 LAB — URINALYSIS, ROUTINE W REFLEX MICROSCOPIC
Glucose, UA: NEGATIVE mg/dL
Ketones, ur: NEGATIVE mg/dL
pH: 6 (ref 5.0–8.0)

## 2012-05-14 LAB — URINE MICROSCOPIC-ADD ON

## 2012-05-14 MED ORDER — SODIUM CHLORIDE 0.9 % IV BOLUS (SEPSIS)
1000.0000 mL | Freq: Once | INTRAVENOUS | Status: AC
  Administered 2012-05-15: 1000 mL via INTRAVENOUS

## 2012-05-14 NOTE — ED Provider Notes (Signed)
History     CSN: [id number]  Arrival date & time 05/14/12  2037   First MD Initiated Contact with Patient 05/14/12 2218      Chief Complaint  Patient presents with  . Loss of Consciousness    (Consider location/radiation/quality/duration/timing/severity/associated sxs/prior treatment) Patient is a 79 y.o. female presenting with general illness. The history is provided by the patient. No language interpreter was used.  Illness  The current episode started today. The problem occurs rarely. The problem has been resolved. The problem is moderate. Nothing relieves the symptoms. Nothing aggravates the symptoms. Associated symptoms include abdominal pain and constipation. Pertinent negatives include no fever, no diarrhea, no nausea, no vomiting, no congestion, no headaches, no sore throat, no cough and no rash.    Past Medical History  Diagnosis Date  . Atypical chest pain   . Palpitation   . SOB (shortness of breath)     Episodic  . Hypertensive urgency     Episodic  . HTN (hypertension)   . Asthma     Underlying  . Kidney disease     Chronic, stage 3, with creatinine clearance estimated to be about 40  . Anxiety     Chronic  . Constipation   . GERD (gastroesophageal reflux disease)   . Hiatal hernia     Hx of  . Hyperlipidemia   . Osteoarthritis   . CHF (congestive heart failure)   . PVD (peripheral vascular disease)   . AAA (abdominal aortic aneurysm)     Past Surgical History  Procedure Date  . US echocardiography 10-12-08    2-D done at [facility] on October 12, 2008 showed some diastolic dysfunction, EF 55%, mild tricuspid regurgitation, right ventricular systolic pressure elevated a 40-50 mmHg, mild valvular aortic stenosis noted.  . Cardiovascular stress test 04-19-2008    showing EF 69% with otherwise a normal stress nuclear study  . Tonsillectomy   . Adenoidectomy   . Spine surgery     2001, lumbar at the L4-5 level; in 2004, C-spine operation  with fusion at the  C5-6 level; in 2004, left thumb CMC joint operation; in 2005 bilateral rotator cuff surgeries  . Bladder suspension     surgery  . Cardiac catheterization 01-2006    showing minimal proximal left anterior descending artery disease. showing less than 50% stenosis of the proximal LAD and otherwise normal coronary arteries,  . Back surgery     x2  . Iliac artery stent 03/07/2012    History reviewed. No pertinent family history.  History  Substance Use Topics  . Smoking status: Never Smoker   . Smokeless tobacco: Not on file     Comment: Quit 67yrs  . Alcohol Use: No    OB History    Grav Para Term Preterm Abortions TAB SAB Ect Mult Living                  Review of Systems  Constitutional: Negative for fever and chills.  HENT: Negative for congestion and sore throat.   Respiratory: Negative for cough and shortness of breath.   Cardiovascular: Negative for chest pain and leg swelling.  Gastrointestinal: Positive for abdominal pain and constipation. Negative for nausea, vomiting and diarrhea.  Genitourinary: Negative for dysuria and frequency.  Skin: Negative for color change and rash.  Neurological: Positive for syncope and light-headedness. Negative for dizziness and headaches.  Psychiatric/Behavioral: Negative for confusion and agitation.  All other systems reviewed and are negative.  Allergies  Ampicillin and Penicillins  Home Medications   Current Outpatient Rx  Name  Route  Sig  Dispense  Refill  . ACETAMINOPHEN 325 MG PO TABS   Oral   Take 650 mg by mouth 2 (two) times daily as needed. For pain         . ALPRAZOLAM 0.25 MG PO TABS   Oral   Take 0.25 mg by mouth at bedtime. For sleep         . CARVEDILOL 25 MG PO TABS      Take 1 tablet (25 mg total) by mouth 2 (two) times daily with a meal.   Hold SBP < 105   60 tablet   3     Hold SBP < 105   . VITAMIN D 1000 UNITS PO TABS   Oral   Take 2,000 Units by mouth daily.            . COLESEVELAM HCL 625 MG PO TABS   Oral   Take 1,250 mg by mouth 2 (two) times daily.          Marland Kitchen FLUTICASONE PROPIONATE 50 MCG/ACT NA SUSP   Nasal   Place 2 sprays into the nose at bedtime.         . FUROSEMIDE 40 MG PO TABS   Oral   Take 40 mg by mouth daily.         Marland Kitchen HYDROCODONE-ACETAMINOPHEN 5-500 MG PO TABS   Oral   Take 1 tablet by mouth 2 (two) times daily as needed. For pain         . LISINOPRIL 20 MG PO TABS   Oral   Take 20 mg by mouth every evening.         Marland Kitchen MIRTAZAPINE 30 MG PO TABS   Oral   Take 30 mg by mouth at bedtime.         Marland Kitchen FISH OIL 1000 MG PO CAPS   Oral   Take 1 capsule by mouth daily.           BP 100/48  Pulse 74  Temp 98.1 F (36.7 C)  Resp 23  SpO2 94%  Physical Exam  Vitals reviewed. Constitutional: She is oriented to person, place, and time. She appears well-developed and well-nourished. No distress.  HENT:  Head: Normocephalic and atraumatic.  Eyes: EOM are normal. Pupils are equal, round, and reactive to light.  Neck: Normal range of motion. Neck supple. No JVD present.  Cardiovascular: Normal rate and regular rhythm.   Murmur heard.  Systolic murmur is present with a grade of 3/6  Pulses:      Radial pulses are 2+ on the right side, and 2+ on the left side.  Pulmonary/Chest: Effort normal and breath sounds normal. No respiratory distress. She has no decreased breath sounds.  Abdominal: Soft. She exhibits no distension, no pulsatile midline mass and no mass. There is generalized tenderness. There is no rigidity, no rebound, no guarding, no tenderness at McBurney's point and negative Murphy's sign.  Genitourinary: Rectum normal.       No stool impaction   Musculoskeletal: Normal range of motion. She exhibits no edema.       Right lower leg: She exhibits no edema.       Left lower leg: She exhibits no edema.  Neurological: She is alert and oriented to person, place, and time. She has normal strength. No cranial  nerve deficit or sensory deficit. Coordination normal. GCS eye subscore  is 4. GCS verbal subscore is 5. GCS motor subscore is 6.  Skin: Skin is warm and dry.  Psychiatric: She has a normal mood and affect. Her behavior is normal.    ED Course  Procedures (including critical care time)  Labs Reviewed  GLUCOSE, CAPILLARY - Abnormal; Notable for the following:    Glucose-Capillary 181 (*)     All other components within normal limits   No results found.  Date: 05/15/2012  Rate: 80  Rhythm: normal sinus rhythm  QRS Axis: normal  Intervals: normal  ST/T Wave abnormalities: normal  Conduction Disutrbances:none  Narrative Interpretation:   Old EKG Reviewed: unchanged  Comprehensive metabolic panel (Final result)  Abnormal  Component (Lab Inquiry)      Result Time  NA  K  CL  CO2  GLUCOSE    05/15/12 0103  138  3.4 (L)  97  27  149 (H)           Result Time  BUN  Creatinine, Ser  CALCIUM  PROTEIN  Albumin    05/15/12 0103  40 (H)  1.43 (H)  9.2  6.0  3.0 (L)           Result Time  AST  ALT  ALK PHOS  BILI TOTL  GFR calc non Af Amer    05/15/12 0103  15  10  70  0.2 (L)  33 (L)           Result Time  GFR calc Af Amer    05/15/12 0103  38 (L) The eGFR has been calculated using the CKD EPI equation. This calculation has not been validated in all clinical situations. eGFR's persistently <90 mL/min signify possible Chronic Kidney Disease.         CBC with Differential (Final result)  Abnormal  Component (Lab Inquiry)      Result Time  WBC  RBC  HGB  HCT  MCV    05/15/12 0035  11.1 (H)  4.22  12.3  37.7  89.3           Result Time  MCH  MCHC  RDW  PLT  NEUTRO PCT    05/15/12 0035  29.1  32.6  13.1  255  63           Result Time  AB NEUTRO  LYMPHO PCT  AB LYM  MONO PCT  MONO ABS    05/15/12 0035  7.0  23  2.5  10  1.1 (H)           Result Time  EOS PCT  EOSINO ABS  BASOS PCT  BASOS ABS    05/15/12 0035  3  0.4  0  0.0         Urine microscopic-add on (Final result)  Abnormal   Component (Lab Inquiry)      Result Time  WBC U  RBC / HPF  BACTERIA  Casts    05/14/12 2348  3-6  0-2  RARE  HYALINE CASTS (A)         Urinalysis, Routine w reflex microscopic (Final result)  Abnormal  Component (Lab Inquiry)      Result Time  Color, Urine  APPearance  Specific Gravity, Urine  pH  GLUCOSE U    05/14/12 2348  YELLOW  CLOUDY (A)  1.011  6.0  NEGATIVE           Result Time  Hgb urine dipstick  BILI UR  Ketones, ur  Protein, ur  Urobilinogen, UA    05/14/12 2348  NEGATIVE  NEGATIVE  NEGATIVE  NEGATIVE  0.2           Result Time  Nitrite  LEUKOCYTES    05/14/12 2348  NEGATIVE  TRACE (A)         Glucose, capillary (Final result)  Abnormal  Component (Lab Inquiry)      Result Time  Glucose-Capillary    05/14/12 2048  181 (H)          Imaging Results         DG Chest 2 View (Final result)   Result time:05/15/12 0981    Final result by Rad Results In Interface (05/15/12 00:54:01)    Narrative:   *RADIOLOGY REPORT*  Clinical Data: Loss of consciousness, history hypertension, asthma, GERD  CHEST - 2 VIEW  Comparison: 12/08/2011  Findings: Normal heart size and pulmonary vascularity. Calcified mildly tortuous thoracic aorta. Question left nipple shadow versus nodular density left lung base. Question COPD without infiltrate, pleural effusion or pneumothorax. Osseous demineralization with endplate spur formation thoracic spine and evidence of prior cervical spine fusion.  IMPRESSION: Question COPD. No acute abnormalities.   Original Report Authenticated By: Ulyses Southward, M.D.          No diagnosis found.    MDM  Pt w/ PMHx of AAA, chronic constipation, CHF, aortic stenosis, CAD, presents to ED for near syncopal episode. States she was in normal state of health today. Was eating dinner and developed acute abdominal pain. Stood up and became lightheaded and weak. Was helped to a chair. No true syncope or LOC. Denies chest pain/palpitations/dyspnea or  HA prior to fall. Admits to ongoing constipation for several months. Small caliber stool. Admit to intermittent abdominal pain - diffuse, crampy. Denies nausea/vomiting. No urinary sx. No fever.   Exam: normotensive, afebrile, pulse 81, no resp distress or hypoxia. GCS 15, 5/5 strength in all extremities. Lungs CTAB, heart rrr, systolic ejection murmur grade III, abd soft and benign, mild diffuse ttp, no palpable pulsatile mass. Rectal exam w/out impaction.   DDx: concern for AAA, pts abdominal pain likely 2/2 constipation. Near syncopal episode possibly vasovagal, orthostatic, dehydration, valvular. Doubt CVA or ACS or PE.   Plan: will check CT abd/pelvis, CXR, ECG, CBC, u/a, CMP and orthostatics  Course: reassessed, + orthostatics - given 1L IVF,  vitals stable, NAD, CXR - possible COPD, NACPF, ECG - NSR, normal intervals, no acute ischemia, u/a clear, hgb 12.3, Cr elevated at 1.43, BUN 40 - dehydrated, given IVF.  CT abd pending. D/w Dr Evlyn Kanner at [facility] medical and pt admitted for obs. Care transferred to Dr Silverio Lay   1. Near syncope   2. Abdominal pain   3. Constipation            Audelia Hives, MD 05/15/12 [phone]

## 2012-05-14 NOTE — ED Notes (Signed)
Patient transported to X-ray 

## 2012-05-14 NOTE — ED Notes (Addendum)
Per EMS: pt coming from [facility]. Pt attempted to standup and had a syncopal episode, and laid down by staff and did not fall. Pt also c/o of bilateral upper abdominal pain. Pt has hx of abdominal aneurysm, no palpable mass. All abdominal fields are soft and non-tender to palpation. Pt reports abdominal pain all day. Pt is A&Ox4, skin warm and dry, respirations are equal and unlabored. Family reports pt was not confused when pt woke up, just a little slow to respond to questions.

## 2012-05-15 ENCOUNTER — Emergency Department (HOSPITAL_COMMUNITY): Payer: Medicare Other

## 2012-05-15 ENCOUNTER — Encounter (HOSPITAL_COMMUNITY): Payer: Self-pay | Admitting: *Deleted

## 2012-05-15 DIAGNOSIS — E876 Hypokalemia: Secondary | ICD-10-CM | POA: Diagnosis present

## 2012-05-15 DIAGNOSIS — R55 Syncope and collapse: Secondary | ICD-10-CM | POA: Diagnosis present

## 2012-05-15 DIAGNOSIS — K5900 Constipation, unspecified: Secondary | ICD-10-CM | POA: Diagnosis present

## 2012-05-15 DIAGNOSIS — I739 Peripheral vascular disease, unspecified: Secondary | ICD-10-CM

## 2012-05-15 LAB — CBC WITH DIFFERENTIAL/PLATELET
Eosinophils Absolute: 0.4 10*3/uL (ref 0.0–0.7)
Eosinophils Relative: 3 % (ref 0–5)
Lymphs Abs: 2.5 10*3/uL (ref 0.7–4.0)
MCH: 29.1 pg (ref 26.0–34.0)
MCV: 89.3 fL (ref 78.0–100.0)
Platelets: 255 10*3/uL (ref 150–400)
RBC: 4.22 MIL/uL (ref 3.87–5.11)

## 2012-05-15 LAB — COMPREHENSIVE METABOLIC PANEL
BUN: 40 mg/dL — ABNORMAL HIGH (ref 6–23)
Calcium: 9.2 mg/dL (ref 8.4–10.5)
GFR calc Af Amer: 38 mL/min — ABNORMAL LOW (ref >90)
Glucose, Bld: 149 mg/dL — ABNORMAL HIGH (ref 70–99)
Sodium: 138 mEq/L (ref 135–145)
Total Protein: 6 g/dL (ref 6.0–8.3)

## 2012-05-15 MED ORDER — SODIUM CHLORIDE 0.9 % IV SOLN
INTRAVENOUS | Status: AC
  Administered 2012-05-15: 06:00:00 via INTRAVENOUS

## 2012-05-15 MED ORDER — VITAMIN D3 25 MCG (1000 UNIT) PO TABS
2000.0000 [IU] | ORAL_TABLET | Freq: Every day | ORAL | Status: DC
  Administered 2012-05-15 – 2012-05-16 (×2): 2000 [IU] via ORAL
  Filled 2012-05-15 (×2): qty 2

## 2012-05-15 MED ORDER — IOHEXOL 300 MG/ML  SOLN: 80.0000 mL | Freq: Once | INTRAMUSCULAR | Status: AC | PRN

## 2012-05-15 MED ORDER — FLUTICASONE PROPIONATE 50 MCG/ACT NA SUSP
2.0000 | Freq: Every day | NASAL | Status: DC
  Administered 2012-05-15: 2 via NASAL
  Filled 2012-05-15: qty 16

## 2012-05-15 MED ORDER — OMEGA-3-ACID ETHYL ESTERS 1 G PO CAPS
1.0000 g | ORAL_CAPSULE | Freq: Two times a day (BID) | ORAL | Status: DC
  Administered 2012-05-15 – 2012-05-16 (×3): 1 g via ORAL
  Filled 2012-05-15 (×4): qty 1

## 2012-05-15 MED ORDER — SODIUM CHLORIDE 0.9 % IV SOLN
INTRAVENOUS | Status: DC
  Administered 2012-05-15: 19:00:00 via INTRAVENOUS

## 2012-05-15 MED ORDER — POLYETHYLENE GLYCOL 3350 17 G PO PACK
17.0000 g | PACK | Freq: Two times a day (BID) | ORAL | Status: DC
  Filled 2012-05-15 (×2): qty 1

## 2012-05-15 MED ORDER — DOCUSATE SODIUM 100 MG PO CAPS
100.0000 mg | ORAL_CAPSULE | Freq: Two times a day (BID) | ORAL | Status: DC
  Administered 2012-05-15 – 2012-05-16 (×3): 100 mg via ORAL
  Filled 2012-05-15 (×4): qty 1

## 2012-05-15 MED ORDER — CARVEDILOL 25 MG PO TABS
25.0000 mg | ORAL_TABLET | Freq: Two times a day (BID) | ORAL | Status: DC
  Administered 2012-05-15 – 2012-05-16 (×4): 25 mg via ORAL
  Filled 2012-05-15 (×5): qty 1

## 2012-05-15 MED ORDER — HYDROCODONE-ACETAMINOPHEN 5-325 MG PO TABS: 1.0000 | ORAL_TABLET | Freq: Four times a day (QID) | ORAL | Status: DC | PRN

## 2012-05-15 MED ORDER — LISINOPRIL 20 MG PO TABS
20.0000 mg | ORAL_TABLET | Freq: Every evening | ORAL | Status: DC
  Administered 2012-05-15 – 2012-05-16 (×2): 20 mg via ORAL
  Filled 2012-05-15 (×2): qty 1

## 2012-05-15 MED ORDER — MIRTAZAPINE 30 MG PO TABS
30.0000 mg | ORAL_TABLET | Freq: Every day | ORAL | Status: DC
  Administered 2012-05-15: 30 mg via ORAL
  Filled 2012-05-15 (×2): qty 1

## 2012-05-15 MED ORDER — ALPRAZOLAM 0.25 MG PO TABS
0.2500 mg | ORAL_TABLET | Freq: Every day | ORAL | Status: DC
  Administered 2012-05-15: 0.25 mg via ORAL
  Filled 2012-05-15: qty 1

## 2012-05-15 MED ORDER — POTASSIUM CHLORIDE CRYS ER 20 MEQ PO TBCR
20.0000 meq | EXTENDED_RELEASE_TABLET | Freq: Two times a day (BID) | ORAL | Status: DC
  Administered 2012-05-15 – 2012-05-16 (×3): 20 meq via ORAL
  Filled 2012-05-15 (×4): qty 1

## 2012-05-15 NOTE — Progress Notes (Signed)
Utilization Review Completed.Treylon Henard T1/09/2012   

## 2012-05-15 NOTE — ED Notes (Signed)
Patient transported to CT 

## 2012-05-15 NOTE — ED Notes (Signed)
Attempted to call report. Floor RN unable to accept report.  

## 2012-05-15 NOTE — ED Provider Notes (Signed)
Patient suffered near syncopal event while eating to this evening. She was suffering mild abdominal discomfort at times near syncopal event she is presently asymptomatic area on exam pleasant cooperative lungs clear with patient heart regular rate and rhythm abdomen obese normal active bowel sounds nontender  Doug Sou, MD 05/15/12 [phone]

## 2012-05-15 NOTE — ED Provider Notes (Signed)
I have personally seen and examined the patient.  I have discussed the plan of care with the resident.  I have reviewed the documentation on PMH/FH/Soc. History.  I have reviewed the documentation of the resident and agree.  Doug Sou, MD 05/15/12 [phone]

## 2012-05-15 NOTE — H&P (Signed)
PCP:   [name], MD   Chief Complaint:  Passed out  HPI: This is an 79 year old white female is brought to the emergency room after a syncope spell. She had been eating dinner with her family. She had just pushed back her plate and had abdominal pain. She stood up but almost immediately "lost control" and her family helped her to the floor. There was a period of time for least a few seconds where she was not able to respond in a normal manner. She has not think she entirely passed out but is not sure. Other than the abdominal pain she had no other prodromal symptoms. Specifically she had no chest pain palpitations vision change or headache. She had no loss of bowel or bladder control. She had no seizure activity. She's only had one other episode like this several years ago. Recent days and marked by considerable bowel difficulties. She had a rectocele repair several months ago and has done poorly since this time. She'll often spend 2-3 hours straining on the commode with minimal results. Her stools have been small caliber. She's had no infectious symptoms. Her peripheral vascular symptoms are doing much better since the procedure in the fall. She's had some minimal left lower extremity edema. She's had orthopnea or PND. She's had no exertional cardiac chest pain. Her weight has been stable. The rest of the day prior to this episode was relatively normal. She felt tired and washed out but had no residual pain. Other issues include both legs giving way at times. She has had a fall and twisted both hips with pain bilaterally in the last couple of months. With regard her bowel issues her last colon check was greater than 8 years ago. Importantly 2 sisters have had colon cancer. She's been stable since transfer to the floor. Her rhythm has been stable without difficulties. I review our office records reveals that her peripheral vascular status was stable in early December. A recent look at her aortic stenosis  is also stable. No new medications have been started. Over-the-counter medication use has included Colace, Peri-Colace, and Metamucil.       Review of Systems:  Review of Systems - Negative except As above Past Medical History: Past Medical History  Diagnosis Date  . Atypical chest pain   . Palpitation   . SOB (shortness of breath)     Episodic  . Hypertensive urgency     Episodic  . HTN (hypertension)   . Asthma     Underlying  . Kidney disease     Chronic, stage 3, with creatinine clearance estimated to be about 40  . Anxiety     Chronic  . Constipation   . GERD (gastroesophageal reflux disease)   . Hiatal hernia     Hx of  . Hyperlipidemia   . Osteoarthritis   . CHF (congestive heart failure)   . PVD (peripheral vascular disease)   . AAA (abdominal aortic aneurysm)    HTN-LVH/Aortic Stenosis.    Hyperlipidemia,    Anxiety.,   CKD Stage III,    Secondary Hyperparathyroidism.,   Stress  incontenance,   GERD/Hiatal hernia,   PNA admission 02/2008.,   Allergic rhinitis (H/O immunotherapy w/Dr Corinda Gubler),   Non-obstructive CAD 40 - 50% LAD stenosis 2002 and 2007.  negative stress test 12/09.,   OA,   Gout.   H/O BCC, Viral gastroenteritis (2007),   Osteoporosis.  Insomnia. PAD - B LE Arteriogram and B Aorto/Iliac Stents and legs have been  markedly better since - Oct 30. Past Surgical History  Procedure Date  . US echocardiography 10-12-08    2-D done at [facility] on October 12, 2008 showed some diastolic dysfunction, EF 55%, mild tricuspid regurgitation, right ventricular systolic pressure elevated a 40-50 mmHg, mild valvular aortic stenosis noted.  . Cardiovascular stress test 04-19-2008    showing EF 69% with otherwise a normal stress nuclear study  . Tonsillectomy   . Adenoidectomy   . Spine surgery     2001, lumbar at the L4-5 level; in 2004, C-spine operation with fusion at the  C5-6 level; in 2004, left thumb CMC joint operation; in 2005 bilateral rotator  cuff surgeries  . Bladder suspension     surgery  . Cardiac catheterization 01-2006    showing minimal proximal left anterior descending artery disease. showing less than 50% stenosis of the proximal LAD and otherwise normal coronary arteries,  . Back surgery     x2  . Iliac artery stent 03/07/2012    Hysterectomy Cholecystectomy Rectocele/bladder tac Lumbar and Cervical Laminectomies. Right and left rotator cuff Tonsillectomy w/Adnoids  Medications: Prior to Admission medications   Medication Sig Start Date End Date Taking? Authorizing Provider  acetaminophen (TYLENOL) 325 MG tablet Take 650 mg by mouth 2 (two) times daily as needed. For pain   Yes Historical Provider, MD  ALPRAZolam (XANAX) 0.25 MG tablet Take 0.25 mg by mouth at bedtime. For sleep 11/13/11  Yes Historical Provider, MD  carvedilol (COREG) 25 MG tablet Take 1 tablet (25 mg total) by mouth 2 (two) times daily with a meal.   Hold SBP < 105 12/11/11  Yes [name], MD  cholecalciferol (VITAMIN D) 1000 UNITS tablet Take 2,000 Units by mouth daily.    Yes Historical Provider, MD  colesevelam (WELCHOL) 625 MG tablet Take 1,250 mg by mouth 2 (two) times daily.    Yes Historical Provider, MD  fluticasone (FLONASE) 50 MCG/ACT nasal spray Place 2 sprays into the nose at bedtime.   Yes Historical Provider, MD  furosemide (LASIX) 40 MG tablet Take 40 mg by mouth daily.   Yes Historical Provider, MD  HYDROcodone-acetaminophen (VICODIN) 5-500 MG per tablet Take 1 tablet by mouth 2 (two) times daily as needed. For pain 12/26/11  Yes Historical Provider, MD  lisinopril (PRINIVIL,ZESTRIL) 20 MG tablet Take 20 mg by mouth every evening.   Yes Historical Provider, MD  mirtazapine (REMERON) 30 MG tablet Take 30 mg by mouth at bedtime.   Yes Historical Provider, MD  Omega-3 Fatty Acids (FISH OIL) 1000 MG CAPS Take 1 capsule by mouth daily.   Yes Historical Provider, MD    Allergies:   Allergies  Allergen Reactions  . Ampicillin Other  (See Comments)    Caused colitis  . Penicillins Other (See Comments)    Caused colitis    Social History:  reports that she has never smoked. She does not have any smokeless tobacco history on file. She reports that she does not drink alcohol or use illicit drugs. She is a widow.  She is retired.  She does not smoke or drink.  Family History:   Father and mother are both deceased.  History included COPD and alzheimers.  Physical Exam: Filed Vitals:   05/14/12 2200 05/15/12 0030 05/15/12 0200 05/15/12 0401  BP: 100/48 105/46 113/46 117/68  Pulse: 74 76 82 78  Temp:    97.9 F (36.6 C)  TempSrc:    Oral  Resp: 23 21 22  18  Height:    5\' 3"  (1.6 m)  Weight:    82.101 kg (181 lb)  SpO2: 94% 95% 93% 97%   General appearance: alert, sitting up in bed in no distress, face is symmetric, tongue protrudes midline, or mucous members are moist areas    Neck: no adenopathy, I hear no  no carotid bruit, no JVD is present Resp  clear to auscultation bilaterally, no wheezes rales or rhonchi no accessory muscles in use  Cardio: regular rate and rhythm, with a harsh systolic aortic murmur  GI: Obese soft, non-tender; bowel sounds  hypoactive ; no masses,  no organomegaly, no pulsations  Extremities: extremities  reveal fair pulses bilaterally with trace edema.  Lymph nodes: : no cervical lymphadenopathy Neurologic: Alert and oriented X 3, normal strength and tone. speech is clear and fluent no resting tremor is present. Grip is equal bilaterally. Dorsi flexion strength is good bilaterally .   skin exam: There are some linear petechiae on both lower extremities     Labs on Admission:   [facility] 05/14/12 2337  NA 138  K 3.4*  CL 97  CO2 27  GLUCOSE 149*  BUN 40*  CREATININE 1.43*  CALCIUM 9.2  MG --  PHOS --    Basename 05/14/12 2337  AST 15  ALT 10  ALKPHOS 70  BILITOT 0.2*  PROT 6.0  ALBUMIN 3.0*   No results found for this basename: LIPASE:2,AMYLASE:2 in the last 72  hours  Basename 05/14/12 2337  WBC 11.1*  NEUTROABS 7.0  HGB 12.3  HCT 37.7  MCV 89.3  PLT 255      Radiological Exams on Admission: Dg Chest 2 View  05/15/2012  *RADIOLOGY REPORT*  Clinical Data: Loss of consciousness, history hypertension, asthma, GERD  CHEST - 2 VIEW  Comparison: 12/08/2011  Findings: Normal heart size and pulmonary vascularity. Calcified mildly tortuous thoracic aorta. Question left nipple shadow versus nodular density left lung base. Question COPD without infiltrate, pleural effusion or pneumothorax. Osseous demineralization with endplate spur formation thoracic spine and evidence of prior cervical spine fusion.  IMPRESSION: Question COPD. No acute abnormalities.   Original Report Authenticated By: Ulyses Southward, M.D.    Ct Abdomen Pelvis W Contrast  05/15/2012  *RADIOLOGY REPORT*  Clinical Data: Bilateral upper abdominal pain, near-syncope, history abdominal aortic aneurysm, GERD, hiatal hernia  CT ABDOMEN AND PELVIS WITH CONTRAST  Technique:  Multidetector CT imaging of the abdomen and pelvis was performed following the standard protocol during bolus administration of intravenous contrast. Sagittal and coronal MPR images reconstructed from axial data set.  Contrast:  80 ml Omnipaque-300 IV. No oral contrast administered.  Comparison: 01/07/2005  Findings: Minimal atelectasis left lung base. Gallbladder surgically absent. Renal cortical thinning bilaterally. Liver, spleen, atrophic pancreas, kidneys, and adrenal glands otherwise normal appearance.  Extensive atherosclerotic calcification of the aorta and iliac arteries without discrete aneurysm; maximum aortic diameter measures 2.0 x 2.0 cm infrarenal image 30. Uterus surgically absent with nonvisualization of the ovaries. Appendix not identified. Unremarkable bladder and ureters. Diverticulosis of the sigmoid colon without evidence of diverticulitis.  Proximal transverse colon is collapsed and wall thickness is suboptimally  assessed. Tiny hiatal hernia. Stomach and small bowel loops otherwise unremarkable. No mass, adenopathy, free fluid, or inflammatory process. Bones diffusely demineralized with prior Ray cage fusions at L3-L4 and L4-L5.  IMPRESSION: Minimal sigmoid colonic diverticulosis. Extensive atherosclerotic disease. Tiny hiatal hernia. No definite acute intra abdominal or intrapelvic abnormalities.   Original Report Authenticated By: Ulyses Southward,  M.D.    Orders placed during the hospital encounter of 05/14/12  . ED EKG: SINUS RHYTHM ~ normal P axis, V-rate 50- 99 EARLY PRECORDIAL R/S TRANSITION ~ QRS area positive in V2 BORDERLINE PROLONGED QT INTERVAL ~ QTc >430mS  .    .   .     Assessment/Plan Principal Problem:  *Syncope: This is an unusual episode of what may have been presyncope but is hard to tell if she totally lost consciousness. She was at least less responsive after this episode. It was self resolving without intervention. There is no evidence of any seizure activity. Her neurologic exam is normal. There is no residual weakness. This could have been a vasovagal episode due to the abdominal pain. She does however have quite a bit of vascular disease and carotids need to be evaluated. She has potential for arrhythmias and will be watched on the monitor. I don't see any reason for any neuroimaging at the present time. Her mild hypokalemia is unlikely to be involved. Her blood pressure is a bit soft and could have predisposed to hypotension. We'll hold her diuretics for today. EKG is noted to be nonischemic. No cardiac enzymes were done. Active Problems:  HTN (hypertension): Blood pressures a bit soft as noted above. We'll hold diuretics  Aortic stenosis, moderate: This was recently reevaluated. Given this episode was nonexertional I do not think this was syncope due to her aortic stenosis.  Hyperlipidemia: With her bowel issues will hold her WelChol for now  CKD (chronic kidney disease) stage 3, GFR  30-59 ml/min: Not much worse than her baseline.  CAD (coronary artery disease): Prior nonobstructive disease  PVD (peripheral vascular disease): Doing much better after a recent procedure  Hypokalemia: Replace  Constipation: This is worrisome for a colonic process. A TSH has already been sent. A colonoscopy is certainly reasonable with her strong family history in the severe bowel issues. For the present we will add Colace and MiraLAX. History of abdominal aortic aneurysm: This is not seen to be confirmed on the new CT.   CODE STATUS: Patient is full code   Julian Hy 05/15/2012, 8:32 AM

## 2012-05-16 DIAGNOSIS — R55 Syncope and collapse: Secondary | ICD-10-CM

## 2012-05-16 DIAGNOSIS — I359 Nonrheumatic aortic valve disorder, unspecified: Secondary | ICD-10-CM

## 2012-05-16 LAB — BASIC METABOLIC PANEL
CO2: 26 mEq/L (ref 19–32)
Calcium: 8.7 mg/dL (ref 8.4–10.5)
Creatinine, Ser: 1.12 mg/dL — ABNORMAL HIGH (ref 0.50–1.10)
GFR calc Af Amer: 52 mL/min — ABNORMAL LOW (ref >90)
GFR calc non Af Amer: 44 mL/min — ABNORMAL LOW (ref >90)
GFR calc non Af Amer: 43 mL/min — ABNORMAL LOW (ref >90)
Glucose, Bld: 110 mg/dL — ABNORMAL HIGH (ref 70–99)
Potassium: 4.2 mEq/L (ref 3.5–5.1)
Sodium: 140 mEq/L (ref 135–145)

## 2012-05-16 LAB — CBC
Hemoglobin: 12.2 g/dL (ref 12.0–15.0)
MCH: 29.5 pg (ref 26.0–34.0)
RBC: 4.14 MIL/uL (ref 3.87–5.11)
WBC: 7.2 10*3/uL (ref 4.0–10.5)

## 2012-05-16 LAB — HEMOGLOBIN A1C: Hgb A1c MFr Bld: 6.1 % — ABNORMAL HIGH (ref <5.7)

## 2012-05-16 MED ORDER — PEG-KCL-NACL-NASULF-NA ASC-C 100 G PO SOLR: 1.0000 | Freq: Once | ORAL | Status: DC

## 2012-05-16 MED ORDER — SODIUM CHLORIDE 0.9 % IJ SOLN
3.0000 mL | Freq: Two times a day (BID) | INTRAMUSCULAR | Status: DC
  Administered 2012-05-16: 3 mL via INTRAVENOUS

## 2012-05-16 MED ORDER — PEG 3350-KCL-NABCB-NACL-NASULF 236 G PO SOLR
4000.0000 mL | Freq: Once | ORAL | Status: AC
  Administered 2012-05-16: 4000 mL via ORAL
  Filled 2012-05-16: qty 4000

## 2012-05-16 MED ORDER — DSS 100 MG PO CAPS: 100.0000 mg | ORAL_CAPSULE | Freq: Two times a day (BID) | ORAL | Status: DC

## 2012-05-16 MED ORDER — FUROSEMIDE 40 MG PO TABS: ORAL_TABLET | ORAL | Status: DC

## 2012-05-16 NOTE — Care Management Note (Signed)
    Page 1 of 1   05/16/2012     1:38:12 PM   CARE MANAGEMENT NOTE 05/16/2012  Patient:  Amy Bolton, Amy Bolton   Account Number:  [id number]  Date Initiated:  05/16/2012  Documentation initiated by:  Junius Creamer  Subjective/Objective Assessment:   adm w syncope     Action/Plan:   pt lives alone, supp son   Anticipated DC Date:  05/17/2012   Anticipated DC Plan:  HOME/SELF CARE      DC Planning Services  CM consult      Choice offered to / List presented to:          [facility] arranged  HH - 11 Patient Refused      Status of service:   Medicare Important Message given?   (If response is "NO", the following Medicare IM given date fields will be blank) Date Medicare IM given:   Date Additional Medicare IM given:    Discharge Disposition:  HOME/SELF CARE  Per UR Regulation:  Reviewed for med. necessity/level of care/duration of stay  If discussed at Long Length of Stay Meetings, dates discussed:    Comments:  1/6 13:37 p debbie Calysta Craigo rn.bsn pt does not want hhc. she plans to follow up w orhto phy and does not want hhc.

## 2012-05-16 NOTE — Evaluation (Signed)
Physical Therapy Evaluation Patient Details Name: Amy Bolton MRN: [id number] DOB: 1927-10-02 Today's Date: 05/16/2012 Time: 4098-1191 PT Time Calculation (min): 21 min  PT Assessment / Plan / Recommendation Clinical Impression  Pt admitted for syncope episode while out to eat with family.  Pt reports she has had bilateral hip pain for awhile and has been receiving injections.  Pt also reports hx of falls which were worse prior to LE stents placement however has had at least 1 fall in the past 3 months.  Pt would benefit from acute PT services in order to improve independence with  transfsers and ambulation to prepare for safe d/c home alone.    PT Assessment  Patient needs continued PT services    Follow Up Recommendations  Home health PT    Does the patient have the potential to tolerate intense rehabilitation      Barriers to Discharge        Equipment Recommendations  None recommended by PT    Recommendations for Other Services     Frequency Min 3X/week    Precautions / Restrictions Precautions Precautions: Fall   Pertinent Vitals/Pain Pt reports hip pain however did not rate and reports she did not want meds.      Mobility  Bed Mobility Bed Mobility: Not assessed Details for Bed Mobility Assistance: sitting EOB with RN on arrival Transfers Transfers: Stand to Sit;Sit to Stand Sit to Stand: 4: Min guard;With upper extremity assist;From bed;From toilet Stand to Sit: 4: Min guard;With upper extremity assist;To chair/3-in-1;To toilet Details for Transfer Assistance: verbal cues for safe technique Ambulation/Gait Ambulation/Gait Assistance: 4: Min guard Ambulation Distance (Feet): 100 Feet Assistive device: Rolling walker Ambulation/Gait Assistance Details: verbal cue for safe use of RW, encouraged to WB through UEs on RW to decrease hip discomfort Gait Pattern: Step-through pattern Gait velocity: decreased    Shoulder Instructions     Exercises     PT  Diagnosis: Difficulty walking  PT Problem List: Decreased strength;Decreased mobility;Decreased balance;Decreased knowledge of use of DME PT Treatment Interventions: DME instruction;Gait training;Functional mobility training;Patient/family education;Therapeutic exercise;Therapeutic activities;Balance training;Neuromuscular re-education   PT Goals Acute Rehab PT Goals PT Goal Formulation: With patient Time For Goal Achievement: 05/16/12 Potential to Achieve Goals: Good Pt will go Sit to Stand: with modified independence PT Goal: Sit to Stand - Progress: Goal set today Pt will go Stand to Sit: with modified independence PT Goal: Stand to Sit - Progress: Goal set today Pt will Ambulate: >150 feet;with modified independence;with least restrictive assistive device PT Goal: Ambulate - Progress: Goal set today Pt will Perform Home Exercise Program: with supervision, verbal cues required/provided PT Goal: Perform Home Exercise Program - Progress: Goal set today  Visit Information  Last PT Received On: 05/16/12 Assistance Needed: +1    Subjective Data  Subjective: My hips just give me all sorts of trouble.   Prior Functioning  Home Living Lives With: Alone Type of Home: House Home Access: Level entry Home Layout: One level Home Adaptive Equipment: Straight cane (rollator) Prior Function Level of Independence: Independent with assistive device(s) Communication Communication: No difficulties    Cognition  Overall Cognitive Status: Appears within functional limits for tasks assessed/performed Arousal/Alertness: Awake/alert Orientation Level: Appears intact for tasks assessed Behavior During Session: [facility] for tasks performed    Extremity/Trunk Assessment Right Upper Extremity Assessment RUE ROM/Strength/Tone: [facility] for tasks assessed Left Upper Extremity Assessment LUE ROM/Strength/Tone: [facility] for tasks assessed Right Lower Extremity Assessment RLE ROM/Strength/Tone: [facility] for tasks  assessed (reports  hip pain, has been receiving injections) Left Lower Extremity Assessment LLE ROM/Strength/Tone: [facility] for tasks assessed (reports hip pain, has been receiving injections)   Balance    End of Session PT - End of Session Activity Tolerance: Patient tolerated treatment well Patient left: in chair;with call bell/phone within reach  GP     [facility] E 05/16/2012, 10:06 AM Pager: [phone]

## 2012-05-16 NOTE — Progress Notes (Signed)
Colon 08/18/2004 Amy Bolton. Benign 2 polypectomies noted.

## 2012-05-16 NOTE — Progress Notes (Signed)
Subjective: Admitted yesterday after a syncopal event associated with abdominal pains. There was no evidence of any seizure activity. Her neurologic exam has remained normal. There is no residual weakness. This could have been a vasovagal episode due to the abdominal pain and in light of AS - any dehydration/hypotension may make this worse. Carotids and ECHO are Pending.  No Arrhythmias on tele.  K was repleted.    No current Ab pains. She describes a terrible issue with constipation at home. B hip, back and foot pains discussed  She has known Mod AS and last ECHO was 09/15/11:- Left ventricle: The cavity size was normal. Wall thickness was increased in a pattern of moderate LVH. Systolic function was normal. Wall motion was normal; there were no regional wall motion abnormalities. Doppler parameters are consistent with abnormal left ventricular relaxation (grade 1 diastolic dysfunction). - Aortic valve: There was moderate stenosis. Mild regurgitation. Mean gradient: 26mm Hg (S). Valve area: 1.37cm^2(VTI). - Aorta: Ascending aortic diameter: 38mm (S). She last saw Dr Elease Hashimoto 04/27/12 and his note was reviewed.   Objective: Vital signs in last 24 hours: Temp:  [97.5 F (36.4 C)-98.5 F (36.9 C)] 97.5 F (36.4 C) (01/06 0351) Pulse Rate:  [70-89] 89  (01/06 0351) Resp:  [16-19] 19  (01/06 0351) BP: (98-130)/(51-54) 119/51 mmHg (01/06 0351) SpO2:  [94 %-95 %] 94 % (01/06 0351) Weight change:  Last BM Date: 05/15/12  CBG (last 3)   Basename 05/14/12 2051  GLUCAP 181*    Intake/Output from previous day:  Intake/Output Summary (Last 24 hours) at 05/16/12 0640 Last data filed at 05/16/12 0300  Gross per 24 hour  Intake   1020 ml  Output   1300 ml  Net   -280 ml   01/05 0701 - 01/06 0700 In: 1020 [P.O.:120; I.V.:900] Out: 1300 [Urine:1300]   Physical Exam General appearance: alert and O Eyes: no scleral icterus Throat: oropharynx moist without erythema Resp: CTA Cardio: Reg.   NSR on monitor Carotids - transmitted murmur. GI: soft, non-tender; bowel sounds normal; no masses,  no organomegaly Extremities: no clubbing, cyanosis or edema   Lab Results:  [facility] 05/14/12 2337  NA 138  K 3.4*  CL 97  CO2 27  GLUCOSE 149*  BUN 40*  CREATININE 1.43*  CALCIUM 9.2  MG --  PHOS --     Basename 05/14/12 2337  AST 15  ALT 10  ALKPHOS 70  BILITOT 0.2*  PROT 6.0  ALBUMIN 3.0*     Basename 05/14/12 2337  WBC 11.1*  NEUTROABS 7.0  HGB 12.3  HCT 37.7  MCV 89.3  PLT 255    Lab Results  Component Value Date   INR 0.9 10/21/2008    No results found for this basename: CKTOTAL:3,CKMB:3,CKMBINDEX:3,TROPONINI:3 in the last 72 hours  No results found for this basename: TSH,T4TOTAL,FREET3,T3FREE,THYROIDAB in the last 72 hours  No results found for this basename: VITAMINB12:2,FOLATE:2,FERRITIN:2,TIBC:2,IRON:2,RETICCTPCT:2 in the last 72 hours  Micro Results: No results found for this or any previous visit (from the past 240 hour(s)).   Studies/Results: Dg Chest 2 View  05/15/2012  *RADIOLOGY REPORT*  Clinical Data: Loss of consciousness, history hypertension, asthma, GERD  CHEST - 2 VIEW  Comparison: 12/08/2011  Findings: Normal heart size and pulmonary vascularity. Calcified mildly tortuous thoracic aorta. Question left nipple shadow versus nodular density left lung base. Question COPD without infiltrate, pleural effusion or pneumothorax. Osseous demineralization with endplate spur formation thoracic spine and evidence of prior cervical spine fusion.  IMPRESSION:  Question COPD. No acute abnormalities.   Original Report Authenticated By: Ulyses Southward, M.D.    Ct Abdomen Pelvis W Contrast  05/15/2012  *RADIOLOGY REPORT*  Clinical Data: Bilateral upper abdominal pain, near-syncope, history abdominal aortic aneurysm, GERD, hiatal hernia  CT ABDOMEN AND PELVIS WITH CONTRAST  Technique:  Multidetector CT imaging of the abdomen and pelvis was performed following  the standard protocol during bolus administration of intravenous contrast. Sagittal and coronal MPR images reconstructed from axial data set.  Contrast:  80 ml Omnipaque-300 IV. No oral contrast administered.  Comparison: 01/07/2005  Findings: Minimal atelectasis left lung base. Gallbladder surgically absent. Renal cortical thinning bilaterally. Liver, spleen, atrophic pancreas, kidneys, and adrenal glands otherwise normal appearance.  Extensive atherosclerotic calcification of the aorta and iliac arteries without discrete aneurysm; maximum aortic diameter measures 2.0 x 2.0 cm infrarenal image 30. Uterus surgically absent with nonvisualization of the ovaries. Appendix not identified. Unremarkable bladder and ureters. Diverticulosis of the sigmoid colon without evidence of diverticulitis.  Proximal transverse colon is collapsed and wall thickness is suboptimally assessed. Tiny hiatal hernia. Stomach and small bowel loops otherwise unremarkable. No mass, adenopathy, free fluid, or inflammatory process. Bones diffusely demineralized with prior Ray cage fusions at L3-L4 and L4-L5.  IMPRESSION: Minimal sigmoid colonic diverticulosis. Extensive atherosclerotic disease. Tiny hiatal hernia. No definite acute intra abdominal or intrapelvic abnormalities.   Original Report Authenticated By: Ulyses Southward, M.D.      Medications: Scheduled:   . ALPRAZolam  0.25 mg Oral QHS  . carvedilol  25 mg Oral BID WC  . cholecalciferol  2,000 Units Oral Daily  . docusate sodium  100 mg Oral BID  . fluticasone  2 spray Each Nare QHS  . lisinopril  20 mg Oral QPM  . mirtazapine  30 mg Oral QHS  . omega-3 acid ethyl esters  1 g Oral BID  . potassium chloride  20 mEq Oral BID   Continuous:   . sodium chloride 75 mL/hr at 05/15/12 1906     Assessment/Plan: Principal Problem:  *Syncope Active Problems:  HTN (hypertension)  Aortic stenosis, moderate  Hyperlipidemia  CKD (chronic kidney disease) stage 3, GFR 30-59  ml/min  CAD (coronary artery disease)  PVD (peripheral vascular disease)  Hypokalemia  Constipation  Syncope - Vasovagal with Ab pain +/- Dehydration/low BP with the AS. She is S/P Hydration and lasix being held.  BP currently fine.  Will Hep lock as she does have some CHF issues in the past.  Repeat echo and check carotids.  Ab Pain - (-) CT Ab, (-) labs.  Most likely due to the constipation seen.  Continue aggressive Bowel prep. Hypokalemia - recheck BMET HTN (hypertension): BP better this am. diuretics held.  monitor Aortic stenosis, moderate: last ECHO was 09/2011 - recheck Minimal Leukocytosis - follow CBC again this am. Hyperlipidemia: With her bowel issues will hold her WelChol for now.  She reports little hydrocodone use at home  CKD (chronic kidney disease) stage 3, GFR 30-59 ml/min: Not much worse than her baseline. Recheck Cr this am CAD (coronary artery disease): Prior nonobstructive disease  PVD (peripheral vascular disease): Doing much better after a recent LE procedures   Constipation: This may be worrisome for a colonic process vrs med SEs. A TSH has already been sent. A colonoscopy as outpt may be reasonable with her strong family history in the severe bowel issues. She has had 2 prior colonoscopies in the past.  For the present we will add Colace and MiraLAX and  aggressive Bowel Prep.  History of abdominal aortic aneurysm: This is not seen to be confirmed on the new CT.  CODE STATUS: Patient is full code Hyperglycemia- Check A1C  Basename 05/14/12 2051  GLUCAP 181*  DVT Prophylaxis    LOS: 2 days   Stephen Baruch M 05/16/2012, 6:40 AM

## 2012-05-16 NOTE — Progress Notes (Signed)
  Echocardiogram 2D Echocardiogram has been performed.  Amy Bolton 05/16/2012, 11:25 AM

## 2012-05-16 NOTE — Discharge Summary (Signed)
Physician Discharge Summary  DISCHARGE SUMMARY   Patient ID: Amy Bolton MR#: [id number] DOB/AGE: [birth year] 79 y.o.   Attending Physician:Gema Ringold M  Patient's NWG:NFAOZ,HYQM M, MD  Consults: none  Admit date: 05/14/2012 Discharge date: 05/16/2012  Discharge Diagnoses:  Principal Problem:  *Syncope Active Problems:  HTN (hypertension)  Aortic stenosis, moderate  Hyperlipidemia  CKD (chronic kidney disease) stage 3, GFR 30-59 ml/min  CAD (coronary artery disease)  PVD (peripheral vascular disease)  Hypokalemia  Constipation   Patient Active Problem List  Diagnosis  . HTN (hypertension)  . Diastolic dysfunction  . Volume overload  . LVH (left ventricular hypertrophy)  . Aortic stenosis, moderate  . Hyperlipidemia  . Hip pain  . Adult failure to thrive  . CKD (chronic kidney disease) stage 3, GFR 30-59 ml/min  . CAD (coronary artery disease)  . Obesity  . Rash  . Rectocele  . Syncope  . PVD (peripheral vascular disease)  . Hypokalemia  . Constipation   Past Medical History  Diagnosis Date  . Atypical chest pain   . Palpitation   . SOB (shortness of breath)     Episodic  . Hypertensive urgency     Episodic  . HTN (hypertension)   . Asthma     Underlying  . Kidney disease     Chronic, stage 3, with creatinine clearance estimated to be about 40  . Anxiety     Chronic  . Constipation   . GERD (gastroesophageal reflux disease)   . Hiatal hernia     Hx of  . Hyperlipidemia   . Osteoarthritis   . CHF (congestive heart failure)   . PVD (peripheral vascular disease)   . AAA (abdominal aortic aneurysm)     Discharged Condition: stable   Discharge Medications:   Medication List     As of 05/16/2012  5:28 PM    STOP taking these medications         colesevelam 625 MG tablet   Commonly known as: WELCHOL      TAKE these medications         acetaminophen 325 MG tablet   Commonly known as: TYLENOL   Take 650 mg by mouth 2 (two) times daily  as needed. For pain      ALPRAZolam 0.25 MG tablet   Commonly known as: XANAX   Take 0.25 mg by mouth at bedtime. For sleep      carvedilol 25 MG tablet   Commonly known as: COREG   Take 1 tablet (25 mg total) by mouth 2 (two) times daily with a meal.    Hold SBP < 105      cholecalciferol 1000 UNITS tablet   Commonly known as: VITAMIN D   Take 2,000 Units by mouth daily.      DSS 100 MG Caps   Take 100 mg by mouth 2 (two) times daily.      Fish Oil 1000 MG Caps   Take 1 capsule by mouth daily.      fluticasone 50 MCG/ACT nasal spray   Commonly known as: FLONASE   Place 2 sprays into the nose at bedtime.      furosemide 40 MG tablet   Commonly known as: LASIX   Take 1/2 tablet of a 40 mg = 20 mg by mouth every other day or more often if edema or weight increase.      HYDROcodone-acetaminophen 5-500 MG per tablet   Commonly known as: VICODIN   Take 1 tablet  by mouth 2 (two) times daily as needed. For pain      lisinopril 20 MG tablet   Commonly known as: PRINIVIL,ZESTRIL   Take 20 mg by mouth every evening.      mirtazapine 30 MG tablet   Commonly known as: REMERON   Take 30 mg by mouth at bedtime.          Hospital Procedures: Dg Chest 2 View  05/15/2012  *RADIOLOGY REPORT*  Clinical Data: Loss of consciousness, history hypertension, asthma, GERD  CHEST - 2 VIEW  Comparison: 12/08/2011  Findings: Normal heart size and pulmonary vascularity. Calcified mildly tortuous thoracic aorta. Question left nipple shadow versus nodular density left lung base. Question COPD without infiltrate, pleural effusion or pneumothorax. Osseous demineralization with endplate spur formation thoracic spine and evidence of prior cervical spine fusion.  IMPRESSION: Question COPD. No acute abnormalities.   Original Report Authenticated By: Ulyses Southward, M.D.    Ct Abdomen Pelvis W Contrast  05/15/2012  *RADIOLOGY REPORT*  Clinical Data: Bilateral upper abdominal pain, near-syncope, history  abdominal aortic aneurysm, GERD, hiatal hernia  CT ABDOMEN AND PELVIS WITH CONTRAST  Technique:  Multidetector CT imaging of the abdomen and pelvis was performed following the standard protocol during bolus administration of intravenous contrast. Sagittal and coronal MPR images reconstructed from axial data set.  Contrast:  80 ml Omnipaque-300 IV. No oral contrast administered.  Comparison: 01/07/2005  Findings: Minimal atelectasis left lung base. Gallbladder surgically absent. Renal cortical thinning bilaterally. Liver, spleen, atrophic pancreas, kidneys, and adrenal glands otherwise normal appearance.  Extensive atherosclerotic calcification of the aorta and iliac arteries without discrete aneurysm; maximum aortic diameter measures 2.0 x 2.0 cm infrarenal image 30. Uterus surgically absent with nonvisualization of the ovaries. Appendix not identified. Unremarkable bladder and ureters. Diverticulosis of the sigmoid colon without evidence of diverticulitis.  Proximal transverse colon is collapsed and wall thickness is suboptimally assessed. Tiny hiatal hernia. Stomach and small bowel loops otherwise unremarkable. No mass, adenopathy, free fluid, or inflammatory process. Bones diffusely demineralized with prior Ray cage fusions at L3-L4 and L4-L5.  IMPRESSION: Minimal sigmoid colonic diverticulosis. Extensive atherosclerotic disease. Tiny hiatal hernia. No definite acute intra abdominal or intrapelvic abnormalities.   Original Report Authenticated By: Ulyses Southward, M.D.     History of Present Illness: Ms Sharrow is an 79 yo F with MMP Admitted 05/15/12 after a syncopal event associated with abdominal pains. She has HTN, PAD, AS, and Back/Hip pains   Hospital Course: Admitted 05/15/12 after a syncopal event associated with abdominal pains. There was no evidence of any seizure activity. Her neurologic exam has remained normal. There is no residual weakness. This could have been a vasovagal episode due to the  abdominal pain and in light of AS - any dehydration/hypotension may have made this worse. Carotids Pending and ECHO results listed below. No Arrhythmias on tele. K was repleted.  No current Ab pains.  She describes a terrible issue with constipation at home and was aggressively Bowel prepped - she had great response. B hip, back and foot pains noted and PT ordered. They wanted HHPT and she does not want.  She will go to Dr Luiz Blare tomorrow to discuss ESI.  She has known Mod AS and last ECHO was 09/15/11:- Left ventricle: The cavity size was normal. Wall thickness was increased in a pattern of moderate LVH. Systolic function was normal. Wall motion was normal; there were no regional wall motion abnormalities. Doppler parameters are consistent with abnormal left ventricular relaxation (grade  1 diastolic dysfunction). - Aortic valve: There was moderate stenosis. Mild regurgitation. Mean gradient: 26mm Hg (S). Valve area: 1.37cm^2(VTI). - Aorta: Ascending aortic diameter: 38mm (S).  She last saw Dr Elease Hashimoto 04/27/12 and his note was reviewed.  05/16/12 ECHO showed: - Left ventricle: The cavity size was normal. Wall thickness was normal. Systolic function was normal. The estimated ejection fraction was in the range of 60% to 65%. There was dynamic obstruction. Wall motion was normal; there were no regional wall motion abnormalities. Doppler parameters are consistent with abnormal left ventricular relaxation (grade 1 diastolic dysfunction). - Aortic valve: There was moderate stenosis. Mild regurgitation. Valve area: 0.84cm^2(VTI). Valve area: 0.8cm^2 (Vmax).  Mean gradient 21 Peak 37  Syncope - Vasovagal with Ab pain +/- Dehydration/low BP with the AS. She is S/P Hydration and lasix being held. BP currently fine. Repeat echo showed some changes and I discussed case with Dr Elease Hashimoto.  This degree of AS is not primary reason for her Syncope.  Carotids Pending and I can recheck these later. Will discharge on less  Lasix.  Ab Pain - (-) CT Ab, (-) labs. Most likely due to the constipation seen. Continue aggressive Bowel prep.   Hypokalemia - recheck BMET showed K was fine.  HTN (hypertension): BP better this am. diuretics held. monitor   Aortic stenosis, moderate: Prior ECHO was 09/2011 - recheck gradients are at least stable  Minimal Leukocytosis - better this am.   Hyperlipidemia: With her bowel issues will hold her WelChol for now.   CKD (chronic kidney disease) stage 3, GFR 30-59 ml/min: Cr better post fluids.  Will follow it as outpt.  CAD (coronary artery disease): Prior nonobstructive disease.   PVD (peripheral vascular disease): Doing much better after a recent LE procedures   Constipation: This may be worrisome for a colonic process vrs med SEs. A TSH has already been sent and still Pending. A colonoscopy as outpt may be reasonable with her strong family history in the severe bowel issues. She has had 2 prior colonoscopies in the past. For the present we will add Colace and MiraLAX and aggressive Bowel Prep.  Go Lytely helped incredibly. She reports little hydrocodone use at home.   Colon 08/18/2004  [facility].  Benign 2 polypectomies noted.      History of abdominal aortic aneurysm: This is not seen to be confirmed on the new CT.   CODE STATUS: Patient is full code   Hyperglycemia- Check A1C = 6.1%  05/27/12 follow up OV with me.   Day of Discharge Exam BP 144/80  Pulse 78  Temp 97.9 F (36.6 C) (Oral)  Resp 20  Ht 5\' 3"  (1.6 m)  Wt 82.101 kg (181 lb)  BMI 32.06 kg/m2  SpO2 97%  Physical Exam: See PN exam  Discharge Labs:  [facility] 05/16/12 0740 05/16/12 0524  NA 140 144  K 4.2 4.2  CL 107 109  CO2 26 25  GLUCOSE 110* 94  BUN 22 24*  CREATININE 1.15* 1.12*  CALCIUM 9.2 8.7  MG -- --  PHOS -- --    Basename 05/14/12 2337  AST 15  ALT 10  ALKPHOS 70  BILITOT 0.2*  PROT 6.0  ALBUMIN 3.0*    Basename 05/16/12 0740 05/14/12 2337  WBC 7.2 11.1*   NEUTROABS -- 7.0  HGB 12.2 12.3  HCT 37.8 37.7  MCV 91.3 89.3  PLT 235 255   No results found for this basename: CKTOTAL:3,CKMB:3,CKMBINDEX:3,TROPONINI:3 in the last 72 hours No results  found for this basename: TSH,T4TOTAL,FREET3,T3FREE,THYROIDAB in the last 72 hours No results found for this basename: VITAMINB12:2,FOLATE:2,FERRITIN:2,TIBC:2,IRON:2,RETICCTPCT:2 in the last 72 hours Lab Results  Component Value Date   INR 0.9 10/21/2008       Discharge instructions:  Follow-up Information    Follow up with [name], MD. On 05/27/2012.   Contact information:   [address] [facility] [place] [address] [phone]           Disposition: home  Follow-up Appts: Follow-up with Dr. Timothy Lasso at [facility] in 1-2 weeks..  Call for appointment.  Condition on Discharge: Stable  Tests Needing Follow-up: Labs and Carotids.  Time spent in discharge (includes decision making & examination of pt): 30 minutes.  SignedGwen Pounds 05/16/2012, 5:28 PM

## 2012-11-02 ENCOUNTER — Encounter: Payer: Self-pay | Admitting: Cardiovascular Disease

## 2012-11-02 ENCOUNTER — Ambulatory Visit (INDEPENDENT_AMBULATORY_CARE_PROVIDER_SITE_OTHER): Payer: Medicare Other | Admitting: Cardiovascular Disease

## 2012-11-02 VITALS — BP 118/64 | HR 69 | Ht 63.0 in | Wt 181.8 lb

## 2012-11-02 DIAGNOSIS — I359 Nonrheumatic aortic valve disorder, unspecified: Secondary | ICD-10-CM

## 2012-11-02 DIAGNOSIS — I350 Nonrheumatic aortic (valve) stenosis: Secondary | ICD-10-CM

## 2012-11-02 DIAGNOSIS — I2510 Atherosclerotic heart disease of native coronary artery without angina pectoris: Secondary | ICD-10-CM

## 2012-11-02 NOTE — Patient Instructions (Addendum)
Your physician wants you to follow-up in: 6 months with Dr. Nahser. You will receive a reminder letter in the mail two months in advance. If you don't receive a letter, please call our office to schedule the follow-up appointment.  

## 2012-11-02 NOTE — Assessment & Plan Note (Signed)
She had moderate CAD by cath in 2002 by Dr. Allyson Sabal.

## 2012-11-02 NOTE — Progress Notes (Signed)
Amy Bolton Date of Birth  April 06, 1928 [facility]     [place]  [address]    [address]   [address] [place], [place]  [address]    [place], [place]  [address] [phone]  Fax  [phone]  [phone]  Fax [phone]  Problem list: 1. Aortic stenosis - Echo May 2013 - moderate AS , mean gradient of 26 2. Diastolic dysfunction 3. Renal insufficiency 4. Hyperlipidemia-intolerant to statin therapy 5. Minimal coronary artery disease by cardiac cath 2007 6 Hypertension 7. Chronic back pain Peripheral vascular disease - s/p stenting (Dr. Wyn Quaker at [facility] )  History of Present Illness:  Amy Bolton is a 79 y.o. female with hx of diastolic dysfunction, mild aortic stenosis, and mild CAD.  She walks regularly.    She fell and injured her leg several months ago. She is scheduled have an an MRI of her leg and back later today.  Dec. 18, 2013 She has had a rough year.  She fell this past spring and injured her back.  She has had multiple falling episodes.  She was admitted with CHF which resulted in a SNF stay.   She had stenting of her iliac arteries.  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  November 02, 2012:  Amy Bolton is doing well.  No CP , no dyspnea.  Her rectocele has improved since she stopped her Welchol.  Her constipation is better.    She does not exercise much.  She is having problems with her right hip and is not able to walk much.   Current Outpatient Prescriptions on File Prior to Visit  Medication Sig Dispense Refill  . acetaminophen (TYLENOL) 325 MG tablet Take 650 mg by mouth as needed. For pain      . ALPRAZolam (XANAX) 0.25 MG tablet Take 0.25 mg by mouth at bedtime. For sleep      . carvedilol (COREG) 25 MG tablet Take 1 tablet (25 mg total) by mouth 2 (two) times daily with a meal.   Hold SBP < 105  60 tablet  3  . cholecalciferol (VITAMIN D) 1000 UNITS tablet Take 2,000 Units by mouth daily.       Marland Kitchen docusate sodium 100 MG CAPS  Take 100 mg by mouth 2 (two) times daily.      . fluticasone (FLONASE) 50 MCG/ACT nasal spray Place 2 sprays into the nose at bedtime.      . furosemide (LASIX) 40 MG tablet Take 1/2 tablet of a 40 mg = 20 mg by mouth every other day or more often if edema or weight increase.  30 tablet    . HYDROcodone-acetaminophen (VICODIN) 5-500 MG per tablet Take 1 tablet by mouth as needed. For pain      . mirtazapine (REMERON) 30 MG tablet Take 30 mg by mouth at bedtime.      . Omega-3 Fatty Acids (FISH OIL) 1000 MG CAPS Take 1 capsule by mouth daily.       No current facility-administered medications on file prior to visit.    Allergies  Allergen Reactions  . Ampicillin Other (See Comments)    Caused colitis  . Penicillins Other (See Comments)    Caused colitis    Past Medical History  Diagnosis Date  . Atypical chest pain   . Palpitation   . SOB (shortness of breath)     Episodic  . Hypertensive urgency     Episodic  . HTN (  hypertension)   . Asthma     Underlying  . Kidney disease     Chronic, stage 3, with creatinine clearance estimated to be about 40  . Anxiety     Chronic  . Constipation   . GERD (gastroesophageal reflux disease)   . Hiatal hernia     Hx of  . Hyperlipidemia   . Osteoarthritis   . CHF (congestive heart failure)   . PVD (peripheral vascular disease)   . AAA (abdominal aortic aneurysm)   . Syncope and collapse     Past Surgical History  Procedure Laterality Date  . US echocardiography  10-12-08    2-D done at [facility] on October 12, 2008 showed some diastolic dysfunction, EF 55%, mild tricuspid regurgitation, right ventricular systolic pressure elevated a 40-50 mmHg, mild valvular aortic stenosis noted.  . Cardiovascular stress test  04-19-2008    showing EF 69% with otherwise a normal stress nuclear study  . Tonsillectomy    . Adenoidectomy    . Spine surgery      2001, lumbar at the L4-5 level; in 2004, C-spine operation with fusion  at the  C5-6 level; in 2004, left thumb CMC joint operation; in 2005 bilateral rotator cuff surgeries  . Bladder suspension      surgery  . Cardiac catheterization  01-2006    showing minimal proximal left anterior descending artery disease. showing less than 50% stenosis of the proximal LAD and otherwise normal coronary arteries,  . Back surgery      x2  . Iliac artery stent  03/07/2012    History  Smoking status  . Never Smoker   Smokeless tobacco  . Not on file    Comment: Quit 36yrs    History  Alcohol Use No    History reviewed. No pertinent family history.  Reviw of Systems:  Reviewed in the HPI.  All other systems are negative.  Physical Exam: Blood pressure 118/64, pulse 69, height 5\' 3"  (1.6 m), weight 181 lb 12 oz (82.441 kg). General: Well developed, well nourished, in no acute distress.  Head: Normocephalic, atraumatic, sclera non-icteric, mucus membranes are moist,   Neck: Supple. Carotids are 2 + with radiation of her systolic murmur into her carotid arteries.  No JVD  Lungs: Clear bilaterally to auscultation.  Heart: regular rate.  normal  S1 S2. There is a 1-2/6 systolic murmur at the left sternal border.  Abdomen: Soft, non-tender, non-distended with normal bowel sounds. No hepatomegaly. No rebound/guarding. No masses.  Msk:  Strength and tone are normal  Extremities: No clubbing or cyanosis. No edema.  Distal pedal pulses are 2+ and equal bilaterally.  Neuro: Alert and oriented X 3. Moves all extremities spontaneously.  Psych:  Responds to questions appropriately with a normal affect.  ECG: November 02, 2012;  NSR at 73. Occasional PVCs. Assessment / Plan:

## 2012-11-02 NOTE — Assessment & Plan Note (Signed)
Her AS is stable.  I have encouraged her to try to find an exercise that she is able to do.  I will see her in 6 months.

## 2013-03-08 ENCOUNTER — Other Ambulatory Visit: Payer: Self-pay | Admitting: Orthopedic Surgery

## 2013-03-13 ENCOUNTER — Encounter (HOSPITAL_COMMUNITY): Payer: Self-pay | Admitting: Pharmacist

## 2013-03-14 ENCOUNTER — Other Ambulatory Visit: Payer: Self-pay | Admitting: Orthopedic Surgery

## 2013-03-14 ENCOUNTER — Telehealth: Payer: Self-pay | Admitting: Cardiovascular Disease

## 2013-03-14 NOTE — Pre-Procedure Instructions (Addendum)
Amy Bolton  03/14/2013   Your procedure is scheduled on:  Friday, November 14th at 1020 AM   Report to [facility] Short Stay Main Entrance "A"at 8:20 AM.   Call this number if you have problems the morning of surgery: [phone]   Remember:   Do not eat food or drink liquids after midnight Thursday.   Take these medicines the morning of surgery with A SIP OF WATER: Xanax, Carvedilol, Flonase, Vicodin if needed for pain, and Pantoprazole.   Do not wear jewelry, make-up or nail polish.  Do not wear lotions, powders, or perfumes. You may wear deodorant.  Do not shave underarms & legs 48 hours prior to surgery.    Do not bring valuables to the hospital.  [facility] is not responsible for any belongings or valuables.               Contacts, dentures or bridgework may not be worn into surgery.  Leave suitcase in the car. After surgery it may be brought to your room.  For patients admitted to the hospital, discharge time is determined by your  treatment team.   Name and phone number of your driver:    Special Instructions: Shower using CHG 2 nights before surgery and the night before surgery.  If you shower the day of surgery use CHG.  Use special wash - you have one bottle of CHG for all showers.  You should use approximately 1/3 of the bottle for each shower.   Please read over the following fact sheets that you were given: Pain Booklet, Coughing and Deep Breathing, Blood Transfusion Information, MRSA Information and Surgical Site Infection Prevention

## 2013-03-14 NOTE — Telephone Encounter (Signed)
Received request from Nurse fax box, documents faxed for surgical clearance. To: [facility] Fax number:[phone]  Attention: 03/14/13/KM

## 2013-03-15 ENCOUNTER — Encounter (HOSPITAL_COMMUNITY)
Admission: RE | Admit: 2013-03-15 | Discharge: 2013-03-15 | Disposition: A | Discharge status: Home / Self Care | Payer: Medicare Other | Source: Ambulatory Visit | Attending: Orthopedic Surgery | Admitting: Orthopedic Surgery

## 2013-03-15 ENCOUNTER — Encounter (HOSPITAL_COMMUNITY): Payer: Self-pay

## 2013-03-15 DIAGNOSIS — Z01818 Encounter for other preprocedural examination: Secondary | ICD-10-CM | POA: Insufficient documentation

## 2013-03-15 DIAGNOSIS — Z01812 Encounter for preprocedural laboratory examination: Secondary | ICD-10-CM | POA: Insufficient documentation

## 2013-03-15 LAB — COMPREHENSIVE METABOLIC PANEL
ALT: 12 U/L (ref 0–35)
Albumin: 3.2 g/dL — ABNORMAL LOW (ref 3.5–5.2)
Alkaline Phosphatase: 69 U/L (ref 39–117)
Chloride: 103 mEq/L (ref 96–112)
Glucose, Bld: 71 mg/dL (ref 70–99)
Potassium: 4.8 mEq/L (ref 3.5–5.1)
Sodium: 140 mEq/L (ref 135–145)
Total Protein: 6.7 g/dL (ref 6.0–8.3)

## 2013-03-15 LAB — CBC WITH DIFFERENTIAL/PLATELET
Basophils Absolute: 0 10*3/uL (ref 0.0–0.1)
Basophils Relative: 0 % (ref 0–1)
Eosinophils Absolute: 0.3 10*3/uL (ref 0.0–0.7)
Hemoglobin: 12.8 g/dL (ref 12.0–15.0)
Lymphocytes Relative: 27 % (ref 12–46)
MCH: 29.8 pg (ref 26.0–34.0)
MCHC: 32.3 g/dL (ref 30.0–36.0)
Monocytes Relative: 11 % (ref 3–12)
Neutro Abs: 4.2 10*3/uL (ref 1.7–7.7)
Neutrophils Relative %: 58 % (ref 43–77)
Platelets: 255 10*3/uL (ref 150–400)
RBC: 4.29 MIL/uL (ref 3.87–5.11)
RDW: 14.1 % (ref 11.5–15.5)
WBC: 7.2 10*3/uL (ref 4.0–10.5)

## 2013-03-15 LAB — PROTIME-INR
INR: 0.96 (ref 0.00–1.49)
Prothrombin Time: 12.6 seconds (ref 11.6–15.2)

## 2013-03-15 LAB — TYPE AND SCREEN
ABO/RH(D): A POS
Antibody Screen: NEGATIVE

## 2013-03-15 LAB — APTT: aPTT: 30 seconds (ref 24–37)

## 2013-03-15 LAB — SURGICAL PCR SCREEN: Staphylococcus aureus: NEGATIVE

## 2013-03-15 LAB — URINALYSIS, ROUTINE W REFLEX MICROSCOPIC
Bilirubin Urine: NEGATIVE
Hgb urine dipstick: NEGATIVE
Nitrite: NEGATIVE
Protein, ur: NEGATIVE mg/dL
Specific Gravity, Urine: 1.012 (ref 1.005–1.030)
Urobilinogen, UA: 0.2 mg/dL (ref 0.0–1.0)

## 2013-03-15 LAB — URINE MICROSCOPIC-ADD ON

## 2013-03-21 MED ORDER — CLINDAMYCIN PHOSPHATE 900 MG/50ML IV SOLN
900.0000 mg | INTRAVENOUS | Status: AC
  Administered 2013-03-22: 900 mg via INTRAVENOUS
  Filled 2013-03-21: qty 50

## 2013-03-22 ENCOUNTER — Encounter (HOSPITAL_COMMUNITY): Payer: Medicare Other | Admitting: Anesthesiology

## 2013-03-22 ENCOUNTER — Inpatient Hospital Stay (HOSPITAL_COMMUNITY): Payer: Medicare Other

## 2013-03-22 ENCOUNTER — Encounter (HOSPITAL_COMMUNITY): Payer: Self-pay | Admitting: Anesthesiology

## 2013-03-22 ENCOUNTER — Inpatient Hospital Stay (HOSPITAL_COMMUNITY): Payer: Medicare Other | Admitting: Anesthesiology

## 2013-03-22 ENCOUNTER — Inpatient Hospital Stay (HOSPITAL_COMMUNITY)
Admission: RE | Admit: 2013-03-22 | Discharge: 2013-03-27 | DRG: 470 | Disposition: A | Discharge status: Home Health | Payer: Medicare Other | Source: Ambulatory Visit | Attending: Orthopedic Surgery | Admitting: Orthopedic Surgery

## 2013-03-22 ENCOUNTER — Encounter (HOSPITAL_COMMUNITY)
Admission: RE | Disposition: A | Discharge status: Home Health | Payer: Self-pay | Source: Ambulatory Visit | Attending: Orthopedic Surgery

## 2013-03-22 DIAGNOSIS — Z9089 Acquired absence of other organs: Secondary | ICD-10-CM

## 2013-03-22 DIAGNOSIS — F411 Generalized anxiety disorder: Secondary | ICD-10-CM | POA: Diagnosis present

## 2013-03-22 DIAGNOSIS — D62 Acute posthemorrhagic anemia: Secondary | ICD-10-CM | POA: Diagnosis not present

## 2013-03-22 DIAGNOSIS — J4489 Other specified chronic obstructive pulmonary disease: Secondary | ICD-10-CM | POA: Diagnosis present

## 2013-03-22 DIAGNOSIS — I2510 Atherosclerotic heart disease of native coronary artery without angina pectoris: Secondary | ICD-10-CM | POA: Diagnosis present

## 2013-03-22 DIAGNOSIS — K219 Gastro-esophageal reflux disease without esophagitis: Secondary | ICD-10-CM | POA: Diagnosis present

## 2013-03-22 DIAGNOSIS — I739 Peripheral vascular disease, unspecified: Secondary | ICD-10-CM | POA: Diagnosis present

## 2013-03-22 DIAGNOSIS — M1611 Unilateral primary osteoarthritis, right hip: Secondary | ICD-10-CM | POA: Diagnosis present

## 2013-03-22 DIAGNOSIS — M169 Osteoarthritis of hip, unspecified: Principal | ICD-10-CM | POA: Diagnosis present

## 2013-03-22 DIAGNOSIS — Z6830 Body mass index (BMI) 30.0-30.9, adult: Secondary | ICD-10-CM

## 2013-03-22 DIAGNOSIS — N183 Chronic kidney disease, stage 3 unspecified: Secondary | ICD-10-CM | POA: Diagnosis present

## 2013-03-22 DIAGNOSIS — M1610 Unilateral primary osteoarthritis, unspecified hip: Principal | ICD-10-CM | POA: Diagnosis present

## 2013-03-22 DIAGNOSIS — Z79899 Other long term (current) drug therapy: Secondary | ICD-10-CM

## 2013-03-22 DIAGNOSIS — I714 Abdominal aortic aneurysm, without rupture, unspecified: Secondary | ICD-10-CM | POA: Diagnosis present

## 2013-03-22 DIAGNOSIS — E669 Obesity, unspecified: Secondary | ICD-10-CM | POA: Diagnosis present

## 2013-03-22 DIAGNOSIS — E785 Hyperlipidemia, unspecified: Secondary | ICD-10-CM | POA: Diagnosis present

## 2013-03-22 DIAGNOSIS — I359 Nonrheumatic aortic valve disorder, unspecified: Secondary | ICD-10-CM | POA: Diagnosis present

## 2013-03-22 DIAGNOSIS — E875 Hyperkalemia: Secondary | ICD-10-CM | POA: Diagnosis not present

## 2013-03-22 DIAGNOSIS — I129 Hypertensive chronic kidney disease with stage 1 through stage 4 chronic kidney disease, or unspecified chronic kidney disease: Secondary | ICD-10-CM | POA: Diagnosis present

## 2013-03-22 DIAGNOSIS — Z880 Allergy status to penicillin: Secondary | ICD-10-CM

## 2013-03-22 DIAGNOSIS — Z7982 Long term (current) use of aspirin: Secondary | ICD-10-CM

## 2013-03-22 DIAGNOSIS — J449 Chronic obstructive pulmonary disease, unspecified: Secondary | ICD-10-CM | POA: Diagnosis present

## 2013-03-22 DIAGNOSIS — Z881 Allergy status to other antibiotic agents status: Secondary | ICD-10-CM

## 2013-03-22 DIAGNOSIS — Z882 Allergy status to sulfonamides status: Secondary | ICD-10-CM

## 2013-03-22 HISTORY — PX: TOTAL HIP ARTHROPLASTY: SHX124

## 2013-03-22 SURGERY — ARTHROPLASTY, HIP, TOTAL, ANTERIOR APPROACH
Anesthesia: General | Site: Hip | Laterality: Right | Wound class: Clean

## 2013-03-22 MED ORDER — SODIUM CHLORIDE 0.9 % IV SOLN
20.0000 mg | INTRAVENOUS | Status: DC | PRN
  Administered 2013-03-22: 50 ug/min via INTRAVENOUS

## 2013-03-22 MED ORDER — PHENYLEPHRINE HCL 10 MG/ML IJ SOLN
INTRAMUSCULAR | Status: DC | PRN
  Administered 2013-03-22: 80 ug via INTRAVENOUS
  Administered 2013-03-22 (×2): 120 ug via INTRAVENOUS

## 2013-03-22 MED ORDER — EPHEDRINE SULFATE 50 MG/ML IJ SOLN
INTRAMUSCULAR | Status: DC | PRN
  Administered 2013-03-22 (×2): 15 mg via INTRAVENOUS
  Administered 2013-03-22: 10 mg via INTRAVENOUS
  Administered 2013-03-22: 5 mg via INTRAVENOUS

## 2013-03-22 MED ORDER — DOCUSATE SODIUM 100 MG PO CAPS
100.0000 mg | ORAL_CAPSULE | Freq: Two times a day (BID) | ORAL | Status: DC
  Administered 2013-03-22 – 2013-03-24 (×5): 100 mg via ORAL
  Filled 2013-03-22 (×11): qty 1

## 2013-03-22 MED ORDER — FENTANYL CITRATE 0.05 MG/ML IJ SOLN
25.0000 ug | INTRAMUSCULAR | Status: DC | PRN
  Administered 2013-03-22 (×2): 25 ug via INTRAVENOUS
  Administered 2013-03-22: 50 ug via INTRAVENOUS
  Administered 2013-03-22: 25 ug via INTRAVENOUS

## 2013-03-22 MED ORDER — METHOCARBAMOL 500 MG PO TABS
ORAL_TABLET | ORAL | Status: AC
  Filled 2013-03-22: qty 1

## 2013-03-22 MED ORDER — PHENOL 1.4 % MT LIQD: 1.0000 | OROMUCOSAL | Status: DC | PRN

## 2013-03-22 MED ORDER — FUROSEMIDE 20 MG PO TABS
20.0000 mg | ORAL_TABLET | Freq: Every day | ORAL | Status: DC
  Administered 2013-03-22 – 2013-03-27 (×5): 20 mg via ORAL
  Filled 2013-03-22 (×6): qty 1

## 2013-03-22 MED ORDER — MENTHOL 3 MG MT LOZG
1.0000 | LOZENGE | OROMUCOSAL | Status: DC | PRN
  Filled 2013-03-22: qty 9

## 2013-03-22 MED ORDER — PANTOPRAZOLE SODIUM 40 MG PO TBEC
40.0000 mg | DELAYED_RELEASE_TABLET | Freq: Every day | ORAL | Status: DC
  Administered 2013-03-23 – 2013-03-27 (×5): 40 mg via ORAL
  Filled 2013-03-22 (×5): qty 1

## 2013-03-22 MED ORDER — ACETAMINOPHEN 325 MG PO TABS: 650.0000 mg | ORAL_TABLET | Freq: Four times a day (QID) | ORAL | Status: DC | PRN

## 2013-03-22 MED ORDER — PROMETHAZINE HCL 25 MG/ML IJ SOLN: 12.5000 mg | Freq: Four times a day (QID) | INTRAMUSCULAR | Status: DC | PRN

## 2013-03-22 MED ORDER — POLYETHYLENE GLYCOL 3350 17 G PO PACK
17.0000 g | PACK | Freq: Every day | ORAL | Status: DC | PRN
  Administered 2013-03-22: 17 g via ORAL
  Filled 2013-03-22: qty 1

## 2013-03-22 MED ORDER — CARVEDILOL 25 MG PO TABS
25.0000 mg | ORAL_TABLET | Freq: Two times a day (BID) | ORAL | Status: DC
  Administered 2013-03-22 – 2013-03-27 (×10): 25 mg via ORAL
  Filled 2013-03-22 (×13): qty 1

## 2013-03-22 MED ORDER — 0.9 % SODIUM CHLORIDE (POUR BTL) OPTIME
TOPICAL | Status: DC | PRN
  Administered 2013-03-22: 1000 mL

## 2013-03-22 MED ORDER — ONDANSETRON HCL 4 MG/2ML IJ SOLN
4.0000 mg | Freq: Four times a day (QID) | INTRAMUSCULAR | Status: DC | PRN
  Administered 2013-03-27: 4 mg via INTRAVENOUS
  Filled 2013-03-22: qty 2

## 2013-03-22 MED ORDER — ONDANSETRON HCL 4 MG PO TABS
4.0000 mg | ORAL_TABLET | Freq: Four times a day (QID) | ORAL | Status: DC | PRN
  Administered 2013-03-26: 4 mg via ORAL
  Filled 2013-03-22: qty 1

## 2013-03-22 MED ORDER — MIRTAZAPINE 30 MG PO TABS
30.0000 mg | ORAL_TABLET | Freq: Every day | ORAL | Status: DC
  Administered 2013-03-22 – 2013-03-26 (×5): 30 mg via ORAL
  Filled 2013-03-22 (×7): qty 1

## 2013-03-22 MED ORDER — LISINOPRIL 20 MG PO TABS
20.0000 mg | ORAL_TABLET | Freq: Every day | ORAL | Status: DC
  Administered 2013-03-23 – 2013-03-27 (×4): 20 mg via ORAL
  Filled 2013-03-22 (×5): qty 1

## 2013-03-22 MED ORDER — FENTANYL CITRATE 0.05 MG/ML IJ SOLN
INTRAMUSCULAR | Status: DC | PRN
  Administered 2013-03-22: 100 ug via INTRAVENOUS
  Administered 2013-03-22: 50 ug via INTRAVENOUS

## 2013-03-22 MED ORDER — DEXAMETHASONE 6 MG PO TABS
10.0000 mg | ORAL_TABLET | Freq: Three times a day (TID) | ORAL | Status: AC
  Administered 2013-03-22 – 2013-03-23 (×3): 10 mg via ORAL
  Filled 2013-03-22 (×3): qty 1

## 2013-03-22 MED ORDER — ARTIFICIAL TEARS OP OINT
TOPICAL_OINTMENT | OPHTHALMIC | Status: DC | PRN
  Administered 2013-03-22: 1 via OPHTHALMIC

## 2013-03-22 MED ORDER — HYDROMORPHONE HCL PF 1 MG/ML IJ SOLN: 0.5000 mg | INTRAMUSCULAR | Status: DC | PRN

## 2013-03-22 MED ORDER — ASPIRIN EC 325 MG PO TBEC
325.0000 mg | DELAYED_RELEASE_TABLET | Freq: Two times a day (BID) | ORAL | Status: DC
  Administered 2013-03-22 – 2013-03-27 (×10): 325 mg via ORAL
  Filled 2013-03-22 (×12): qty 1

## 2013-03-22 MED ORDER — ALBUMIN HUMAN 5 % IV SOLN
INTRAVENOUS | Status: DC | PRN
  Administered 2013-03-22 (×2): via INTRAVENOUS

## 2013-03-22 MED ORDER — POVIDONE-IODINE 7.5 % EX SOLN
Freq: Once | CUTANEOUS | Status: DC
  Filled 2013-03-22: qty 118

## 2013-03-22 MED ORDER — NEOSTIGMINE METHYLSULFATE 1 MG/ML IJ SOLN
INTRAMUSCULAR | Status: DC | PRN
  Administered 2013-03-22: 4.98 mg via INTRAVENOUS

## 2013-03-22 MED ORDER — BUPIVACAINE HCL (PF) 0.5 % IJ SOLN
INTRAMUSCULAR | Status: DC | PRN
  Administered 2013-03-22: 20 mL

## 2013-03-22 MED ORDER — ONDANSETRON HCL 4 MG/2ML IJ SOLN
INTRAMUSCULAR | Status: DC | PRN
  Administered 2013-03-22: 4 mg via INTRAVENOUS

## 2013-03-22 MED ORDER — LACTATED RINGERS IV SOLN
INTRAVENOUS | Status: DC | PRN
  Administered 2013-03-22 (×2): via INTRAVENOUS

## 2013-03-22 MED ORDER — DEXAMETHASONE SODIUM PHOSPHATE 10 MG/ML IJ SOLN
10.0000 mg | Freq: Three times a day (TID) | INTRAMUSCULAR | Status: AC
  Filled 2013-03-22 (×3): qty 1

## 2013-03-22 MED ORDER — METHOCARBAMOL 500 MG PO TABS
500.0000 mg | ORAL_TABLET | Freq: Four times a day (QID) | ORAL | Status: DC | PRN
  Administered 2013-03-22 – 2013-03-26 (×3): 500 mg via ORAL
  Filled 2013-03-22 (×2): qty 1

## 2013-03-22 MED ORDER — METHOCARBAMOL 100 MG/ML IJ SOLN
500.0000 mg | Freq: Four times a day (QID) | INTRAVENOUS | Status: DC | PRN
  Filled 2013-03-22: qty 5

## 2013-03-22 MED ORDER — METOCLOPRAMIDE HCL 5 MG/ML IJ SOLN: 10.0000 mg | Freq: Once | INTRAMUSCULAR | Status: DC | PRN

## 2013-03-22 MED ORDER — FLUTICASONE PROPIONATE 50 MCG/ACT NA SUSP
2.0000 | Freq: Every day | NASAL | Status: DC
  Administered 2013-03-22 – 2013-03-23 (×2): 2 via NASAL
  Filled 2013-03-22: qty 16

## 2013-03-22 MED ORDER — ALPRAZOLAM 0.25 MG PO TABS
0.2500 mg | ORAL_TABLET | Freq: Every day | ORAL | Status: DC
  Administered 2013-03-22 – 2013-03-24 (×3): 0.25 mg via ORAL
  Filled 2013-03-22 (×4): qty 1

## 2013-03-22 MED ORDER — ROCURONIUM BROMIDE 100 MG/10ML IV SOLN
INTRAVENOUS | Status: DC | PRN
  Administered 2013-03-22: 50 mg via INTRAVENOUS

## 2013-03-22 MED ORDER — SODIUM CHLORIDE 0.9 % IV SOLN: 1000.0000 mg | INTRAVENOUS | Status: DC

## 2013-03-22 MED ORDER — OXYCODONE-ACETAMINOPHEN 5-325 MG PO TABS
1.0000 | ORAL_TABLET | ORAL | Status: DC | PRN
  Administered 2013-03-22: 2 via ORAL
  Administered 2013-03-23 – 2013-03-27 (×5): 1 via ORAL
  Filled 2013-03-22 (×4): qty 1
  Filled 2013-03-22: qty 2
  Filled 2013-03-22 (×2): qty 1

## 2013-03-22 MED ORDER — DIPHENHYDRAMINE HCL 12.5 MG/5ML PO ELIX: 12.5000 mg | ORAL_SOLUTION | ORAL | Status: DC | PRN

## 2013-03-22 MED ORDER — CLINDAMYCIN PHOSPHATE 600 MG/50ML IV SOLN
600.0000 mg | Freq: Four times a day (QID) | INTRAVENOUS | Status: AC
  Administered 2013-03-22 (×2): 600 mg via INTRAVENOUS
  Filled 2013-03-22 (×2): qty 50

## 2013-03-22 MED ORDER — PROPOFOL 10 MG/ML IV BOLUS
INTRAVENOUS | Status: DC | PRN
  Administered 2013-03-22: 150 mg via INTRAVENOUS

## 2013-03-22 MED ORDER — FENTANYL CITRATE 0.05 MG/ML IJ SOLN
INTRAMUSCULAR | Status: AC
  Filled 2013-03-22: qty 2

## 2013-03-22 MED ORDER — GLYCOPYRROLATE 0.2 MG/ML IJ SOLN
INTRAMUSCULAR | Status: DC | PRN
  Administered 2013-03-22: .83 mg via INTRAVENOUS

## 2013-03-22 MED ORDER — ALUM & MAG HYDROXIDE-SIMETH 200-200-20 MG/5ML PO SUSP
30.0000 mL | ORAL | Status: DC | PRN
  Administered 2013-03-23 – 2013-03-25 (×3): 30 mL via ORAL
  Filled 2013-03-22 (×3): qty 30

## 2013-03-22 MED ORDER — SODIUM CHLORIDE 0.9 % IV SOLN
INTRAVENOUS | Status: DC
  Administered 2013-03-22: 20:00:00 via INTRAVENOUS

## 2013-03-22 MED ORDER — LIDOCAINE HCL (CARDIAC) 20 MG/ML IV SOLN
INTRAVENOUS | Status: DC | PRN
  Administered 2013-03-22: 50 mg via INTRAVENOUS

## 2013-03-22 MED ORDER — FERROUS SULFATE 325 (65 FE) MG PO TABS
325.0000 mg | ORAL_TABLET | Freq: Two times a day (BID) | ORAL | Status: DC
  Administered 2013-03-22 – 2013-03-27 (×10): 325 mg via ORAL
  Filled 2013-03-22 (×12): qty 1

## 2013-03-22 MED ORDER — ACETAMINOPHEN 650 MG RE SUPP: 650.0000 mg | Freq: Four times a day (QID) | RECTAL | Status: DC | PRN

## 2013-03-22 SURGICAL SUPPLY — 54 items
APL SKNCLS STERI-STRIP NONHPOA (GAUZE/BANDAGES/DRESSINGS) ×1
BANDAGE GAUZE ELAST BULKY 4 IN (GAUZE/BANDAGES/DRESSINGS) IMPLANT
BENZOIN TINCTURE PRP APPL 2/3 (GAUZE/BANDAGES/DRESSINGS) ×1 IMPLANT
BLADE SAW SGTL 18X1.27X75 (BLADE) ×2 IMPLANT
BLADE SURG ROTATE 9660 (MISCELLANEOUS) IMPLANT
BNDG COHESIVE 6X5 TAN STRL LF (GAUZE/BANDAGES/DRESSINGS) IMPLANT
CAPT HIP PF MOP ×1 IMPLANT
CELLS DAT CNTRL 66122 CELL SVR (MISCELLANEOUS) ×1 IMPLANT
CLOSURE STERI-STRIP 1/4X4 (GAUZE/BANDAGES/DRESSINGS) ×1 IMPLANT
CLOTH BEACON ORANGE TIMEOUT ST (SAFETY) ×2 IMPLANT
COVER BACK TABLE 24X17X13 BIG (DRAPES) IMPLANT
COVER SURGICAL LIGHT HANDLE (MISCELLANEOUS) ×2 IMPLANT
CUP ACETBLR 54 OD PINNACLE (Hips) IMPLANT
DRAPE C-ARM 42X72 X-RAY (DRAPES) ×2 IMPLANT
DRAPE STERI IOBAN 125X83 (DRAPES) ×2 IMPLANT
DRAPE U-SHAPE 47X51 STRL (DRAPES) ×6 IMPLANT
DRSG MEPILEX BORDER 4X8 (GAUZE/BANDAGES/DRESSINGS) ×2 IMPLANT
DURAPREP 26ML APPLICATOR (WOUND CARE) ×2 IMPLANT
ELECT BLADE 4.0 EZ CLEAN MEGAD (MISCELLANEOUS)
ELECT BLADE TIP CTD 4 INCH (ELECTRODE) ×2 IMPLANT
ELECT CAUTERY BLADE 6.4 (BLADE) ×2 IMPLANT
ELECT REM PT RETURN 9FT ADLT (ELECTROSURGICAL) ×2
ELECTRODE BLDE 4.0 EZ CLN MEGD (MISCELLANEOUS) IMPLANT
ELECTRODE REM PT RTRN 9FT ADLT (ELECTROSURGICAL) ×1 IMPLANT
GAUZE XEROFORM 1X8 LF (GAUZE/BANDAGES/DRESSINGS) ×2 IMPLANT
GLOVE BIOGEL PI IND STRL 8 (GLOVE) ×2 IMPLANT
GLOVE BIOGEL PI INDICATOR 8 (GLOVE) ×2
GLOVE ECLIPSE 7.5 STRL STRAW (GLOVE) ×4 IMPLANT
GOWN STRL NON-REIN LRG LVL3 (GOWN DISPOSABLE) ×4 IMPLANT
GOWN STRL REIN XL XLG (GOWN DISPOSABLE) ×4 IMPLANT
HOOD PEEL AWAY FACE SHEILD DIS (HOOD) ×6 IMPLANT
KIT BASIN OR (CUSTOM PROCEDURE TRAY) ×2 IMPLANT
KIT ROOM TURNOVER OR (KITS) ×2 IMPLANT
MANIFOLD NEPTUNE II (INSTRUMENTS) ×2 IMPLANT
NS IRRIG 1000ML POUR BTL (IV SOLUTION) ×2 IMPLANT
PACK TOTAL JOINT (CUSTOM PROCEDURE TRAY) ×2 IMPLANT
PAD ARMBOARD 7.5X6 YLW CONV (MISCELLANEOUS) ×4 IMPLANT
RETRACTOR WND ALEXIS 18 MED (MISCELLANEOUS) ×1 IMPLANT
RTRCTR WOUND ALEXIS 18CM MED (MISCELLANEOUS) ×2
SPONGE LAP 18X18 X RAY DECT (DISPOSABLE) IMPLANT
STAPLER VISISTAT 35W (STAPLE) IMPLANT
STRIP CLOSURE SKIN 1/2X4 (GAUZE/BANDAGES/DRESSINGS) IMPLANT
SUT ETHIBOND NAB CT1 #1 30IN (SUTURE) IMPLANT
SUT MNCRL AB 3-0 PS2 18 (SUTURE) IMPLANT
SUT VIC AB 0 CT1 27 (SUTURE) ×2
SUT VIC AB 0 CT1 27XBRD ANBCTR (SUTURE) ×1 IMPLANT
SUT VIC AB 1 CT1 27 (SUTURE) ×8
SUT VIC AB 1 CT1 27XBRD ANBCTR (SUTURE) ×4 IMPLANT
SUT VIC AB 2-0 CT1 27 (SUTURE) ×2
SUT VIC AB 2-0 CT1 TAPERPNT 27 (SUTURE) ×1 IMPLANT
TOWEL OR 17X24 6PK STRL BLUE (TOWEL DISPOSABLE) ×2 IMPLANT
TOWEL OR 17X26 10 PK STRL BLUE (TOWEL DISPOSABLE) ×2 IMPLANT
TRAY FOLEY CATH 16FRSI W/METER (SET/KITS/TRAYS/PACK) IMPLANT
WATER STERILE IRR 1000ML POUR (IV SOLUTION) ×4 IMPLANT

## 2013-03-22 NOTE — Brief Op Note (Signed)
03/22/2013  5:03 PM  PATIENT:  Amy Bolton  79 y.o. female  PRE-OPERATIVE DIAGNOSIS:  degenerative joint disease right hip  POST-OPERATIVE DIAGNOSIS:  degenerative joint disease right hip  PROCEDURE:  Procedure(s): TOTAL HIP ARTHROPLASTY ANTERIOR APPROACH (Right)  SURGEON:  Surgeon(s) and Role:    * Harvie Junior, MD - Primary  PHYSICIAN ASSISTANT:   ASSISTANTS: bethune   ANESTHESIA:   general  EBL:  Total I/O In: 1960 [I.V.:1460; IV Piggyback:500] Out: 700 [Urine:300; Blood:400]  BLOOD ADMINISTERED:none  DRAINS: none   LOCAL MEDICATIONS USED:  MARCAINE     SPECIMEN:  No Specimen  DISPOSITION OF SPECIMEN:  N/A  COUNTS:  YES  TOURNIQUET:  * No tourniquets in log *  DICTATION: .Other Dictation: Dictation Number [id number]  PLAN OF CARE: Admit to inpatient   PATIENT DISPOSITION:  PACU - hemodynamically stable.   Delay start of Pharmacological VTE agent (>24hrs) due to surgical blood loss or risk of bleeding: no

## 2013-03-22 NOTE — H&P (Signed)
TOTAL HIP ADMISSION H&P  Patient is admitted for right total hip arthroplasty.  Subjective:  Chief Complaint: right hip pain  HPI: Amy Bolton, 79 y.o. female, has a history of pain and functional disability in the right hip(s) due to arthritis and patient has failed non-surgical conservative treatments for greater than 12 weeks to include NSAID's and/or analgesics, flexibility and strengthening excercises, use of assistive devices, weight reduction as appropriate and activity modification.  Onset of symptoms was gradual starting 2 years ago with gradually worsening course since that time.The patient noted no past surgery on the right hip(s).  Patient currently rates pain in the right hip at 8 out of 10 with activity. Patient has night pain, worsening of pain with activity and weight bearing, trendelenberg gait, pain that interfers with activities of daily living, pain with passive range of motion, crepitus and joint swelling. Patient has evidence of subchondral sclerosis and joint space narrowing by imaging studies. This condition presents safety issues increasing the risk of falls. This patient has had failure of conservative care.  There is no current active infection.  Patient Active Problem List   Diagnosis Date Noted  . Syncope 05/15/2012  . PVD (peripheral vascular disease) 05/15/2012  . Hypokalemia 05/15/2012  . Constipation 05/15/2012  . Rectocele 04/27/2012  . Volume overload 12/09/2011  . LVH (left ventricular hypertrophy) 12/09/2011  . Aortic stenosis, moderate 12/09/2011  . Hyperlipidemia 12/09/2011  . Hip pain 12/09/2011  . Adult failure to thrive 12/09/2011  . CKD (chronic kidney disease) stage 3, GFR 30-59 ml/min 12/09/2011  . CAD (coronary artery disease) 12/09/2011  . Obesity 12/09/2011  . Rash 12/09/2011  . HTN (hypertension) 08/04/2011  . Diastolic dysfunction 08/04/2011   Past Medical History  Diagnosis Date  . Atypical chest pain   . Palpitation   . SOB  (shortness of breath)     Episodic  . Hypertensive urgency     Episodic  . HTN (hypertension)   . Asthma     Underlying  . Kidney disease     Chronic, stage 3, with creatinine clearance estimated to be about 40  . Anxiety     Chronic  . Constipation   . GERD (gastroesophageal reflux disease)   . Hiatal hernia     Hx of  . Hyperlipidemia   . Osteoarthritis   . CHF (congestive heart failure)   . PVD (peripheral vascular disease)   . AAA (abdominal aortic aneurysm)   . Syncope and collapse     Past Surgical History  Procedure Laterality Date  . US echocardiography  10-12-08    2-D done at [facility] on October 12, 2008 showed some diastolic dysfunction, EF 55%, mild tricuspid regurgitation, right ventricular systolic pressure elevated a 40-50 mmHg, mild valvular aortic stenosis noted.  . Cardiovascular stress test  04-19-2008    showing EF 69% with otherwise a normal stress nuclear study  . Tonsillectomy    . Adenoidectomy    . Spine surgery      2001, lumbar at the L4-5 level; in 2004, C-spine operation with fusion at the  C5-6 level; in 2004, left thumb CMC joint operation; in 2005 bilateral rotator cuff surgeries  . Bladder suspension      surgery  . Cardiac catheterization  01-2006    showing minimal proximal left anterior descending artery disease. showing less than 50% stenosis of the proximal LAD and otherwise normal coronary arteries,  . Back surgery      x2  .  Iliac artery stent  03/07/2012  . Tonsillectomy    . Appendectomy    . Cholecystectomy    . Eye surgery Bilateral     cataracts  . Abdominal hysterectomy    . Colonoscopy w/ biopsies and polypectomy      Prescriptions prior to admission  Medication Sig Dispense Refill  . ALPRAZolam (XANAX) 0.25 MG tablet Take 0.25 mg by mouth at bedtime. For sleep      . carvedilol (COREG) 25 MG tablet Take 1 tablet (25 mg total) by mouth 2 (two) times daily with a meal.   Hold SBP < 105  60 tablet  3  .  cholecalciferol (VITAMIN D) 1000 UNITS tablet Take 2,000 Units by mouth daily.       . fluticasone (FLONASE) 50 MCG/ACT nasal spray Place 2 sprays into the nose at bedtime.      . furosemide (LASIX) 40 MG tablet Take 1/2 tablet of a 40 mg = 20 mg by mouth every other day or more often if edema or weight increase.  30 tablet    . HYDROcodone-acetaminophen (VICODIN) 5-500 MG per tablet Take 1 tablet by mouth as needed. For pain      . lisinopril (PRINIVIL,ZESTRIL) 40 MG tablet Take 20 mg by mouth daily.       . mirtazapine (REMERON) 30 MG tablet Take 30 mg by mouth at bedtime.      . Omega-3 Fatty Acids (FISH OIL) 1000 MG CAPS Take 1 capsule by mouth daily.      . pantoprazole (PROTONIX) 40 MG tablet Take 40 mg by mouth as needed.       Tery Sanfilippo Sodium (DSS) 100 MG CAPS Take 100 mg by mouth as needed.       Allergies  Allergen Reactions  . Ampicillin Other (See Comments)    Caused colitis  . Penicillins Other (See Comments)    Caused colitis  . Sulfa Antibiotics Diarrhea and Other (See Comments)    "muscles drew up"    History  Substance Use Topics  . Smoking status: Never Smoker   . Smokeless tobacco: Never Used     Comment: Quit 24yrs  . Alcohol Use: No    History reviewed. No pertinent family history.   ROS ROS: I have reviewed the patient's review of systems thoroughly and there are no positive responses as relates to the HPI. Objective:  Physical Exam  Vital signs in last 24 hours: Temp:  [98.2 F (36.8 C)] 98.2 F (36.8 C) (11/12 0739) Pulse Rate:  [70] 70 (11/12 0739) Resp:  [20] 20 (11/12 0739) BP: (125)/(60) 125/60 mmHg (11/12 0739) SpO2:  [95 %] 95 % (11/12 0739) Well-developed well-nourished patient in no acute distress. Alert and oriented x3 HEENT:within normal limits Cardiac: Regular rate and rhythm Pulmonary: Lungs clear to auscultation Abdomen: Soft and nontender.  Normal active bowel sounds  Musculoskeletal: r hip pain on all rom limited rom all  directions Labs: Recent Results (from the past 2160 hour(s))  URINALYSIS, ROUTINE W REFLEX MICROSCOPIC     Status: Abnormal   Collection Time    03/15/13 10:59 AM      Result Value Range   Color, Urine YELLOW  YELLOW   APPearance CLEAR  CLEAR   Specific Gravity, Urine 1.012  1.005 - 1.030   pH 5.5  5.0 - 8.0   Glucose, UA NEGATIVE  NEGATIVE mg/dL   Hgb urine dipstick NEGATIVE  NEGATIVE   Bilirubin Urine NEGATIVE  NEGATIVE   Ketones,  ur NEGATIVE  NEGATIVE mg/dL   Protein, ur NEGATIVE  NEGATIVE mg/dL   Urobilinogen, UA 0.2  0.0 - 1.0 mg/dL   Nitrite NEGATIVE  NEGATIVE   Leukocytes, UA MODERATE (*) NEGATIVE  SURGICAL PCR SCREEN     Status: None   Collection Time    03/15/13 10:59 AM      Result Value Range   MRSA, PCR NEGATIVE  NEGATIVE   Staphylococcus aureus NEGATIVE  NEGATIVE   Comment:            The Xpert SA Assay (FDA     approved for NASAL specimens     in patients over 64 years of age),     is one component of     a comprehensive surveillance     program.  Test performance has     been validated by [employer] for patients greater     than or equal to 20 year old.     It is not intended     to diagnose infection nor to     guide or monitor treatment.  URINE MICROSCOPIC-ADD ON     Status: None   Collection Time    03/15/13 10:59 AM      Result Value Range   Squamous Epithelial / LPF RARE  RARE   WBC, UA 3-6  <3 WBC/hpf   RBC / HPF 0-2  <3 RBC/hpf   Bacteria, UA RARE  RARE  APTT     Status: None   Collection Time    03/15/13 11:04 AM      Result Value Range   aPTT 30  24 - 37 seconds  CBC WITH DIFFERENTIAL     Status: None   Collection Time    03/15/13 11:04 AM      Result Value Range   WBC 7.2  4.0 - 10.5 K/uL   RBC 4.29  3.87 - 5.11 MIL/uL   Hemoglobin 12.8  12.0 - 15.0 g/dL   HCT 45.4  09.8 - 11.9 %   MCV 92.3  78.0 - 100.0 fL   MCH 29.8  26.0 - 34.0 pg   MCHC 32.3  30.0 - 36.0 g/dL   RDW 14.7  82.9 - 56.2 %   Platelets 255  150 - 400 K/uL    Neutrophils Relative % 58  43 - 77 %   Neutro Abs 4.2  1.7 - 7.7 K/uL   Lymphocytes Relative 27  12 - 46 %   Lymphs Abs 2.0  0.7 - 4.0 K/uL   Monocytes Relative 11  3 - 12 %   Monocytes Absolute 0.8  0.1 - 1.0 K/uL   Eosinophils Relative 4  0 - 5 %   Eosinophils Absolute 0.3  0.0 - 0.7 K/uL   Basophils Relative 0  0 - 1 %   Basophils Absolute 0.0  0.0 - 0.1 K/uL  COMPREHENSIVE METABOLIC PANEL     Status: Abnormal   Collection Time    03/15/13 11:04 AM      Result Value Range   Sodium 140  135 - 145 mEq/L   Potassium 4.8  3.5 - 5.1 mEq/L   Chloride 103  96 - 112 mEq/L   CO2 28  19 - 32 mEq/L   Glucose, Bld 71  70 - 99 mg/dL   BUN 24 (*) 6 - 23 mg/dL   Creatinine, Ser 1.30 (*) 0.50 - 1.10 mg/dL   Calcium 9.5  8.4 - 86.5  mg/dL   Total Protein 6.7  6.0 - 8.3 g/dL   Albumin 3.2 (*) 3.5 - 5.2 g/dL   AST 21  0 - 37 U/L   ALT 12  0 - 35 U/L   Alkaline Phosphatase 69  39 - 117 U/L   Total Bilirubin 0.3  0.3 - 1.2 mg/dL   GFR calc non Af Amer 45 (*) >90 mL/min   GFR calc Af Amer 52 (*) >90 mL/min   Comment: (NOTE)     The eGFR has been calculated using the CKD EPI equation.     This calculation has not been validated in all clinical situations.     eGFR's persistently <90 mL/min signify possible Chronic Kidney     Disease.  PROTIME-INR     Status: None   Collection Time    03/15/13 11:04 AM      Result Value Range   Prothrombin Time 12.6  11.6 - 15.2 seconds   INR 0.96  0.00 - 1.49  TYPE AND SCREEN     Status: None   Collection Time    03/15/13 11:07 AM      Result Value Range   ABO/RH(D) A POS     Antibody Screen NEG     Sample Expiration 03/29/2013    ABO/RH     Status: None   Collection Time    03/15/13 11:07 AM      Result Value Range   ABO/RH(D) A POS      Estimated body mass index is 30.24 kg/(m^2) as calculated from the following:   Height as of 03/15/13: 5\' 5"  (1.651 m).   Weight as of 11/02/12: 82.441 kg (181 lb 12 oz).   Imaging Review Plain radiographs  demonstrate severe degenerative joint disease of the right hip(s). The bone quality appears to be good for age and reported activity level.  Assessment/Plan:  End stage arthritis, right hip(s)  The patient history, physical examination, clinical judgement of the provider and imaging studies are consistent with end stage degenerative joint disease of the right hip(s) and total hip arthroplasty is deemed medically necessary. The treatment options including medical management, injection therapy, arthroscopy and arthroplasty were discussed at length. The risks and benefits of total hip arthroplasty were presented and reviewed. The risks due to aseptic loosening, infection, stiffness, dislocation/subluxation,  thromboembolic complications and other imponderables were discussed.  The patient acknowledged the explanation, agreed to proceed with the plan and consent was signed. Patient is being admitted for inpatient treatment for surgery, pain control, PT, OT, prophylactic antibiotics, VTE prophylaxis, progressive ambulation and ADL's and discharge planning.The patient is planning to be discharged home with home health services

## 2013-03-22 NOTE — Transfer of Care (Signed)
Immediate Anesthesia Transfer of Care Note  Patient: Amy Bolton  Procedure(s) Performed: Procedure(s): TOTAL HIP ARTHROPLASTY ANTERIOR APPROACH (Right)  Patient Location: PACU  Anesthesia Type:General  Level of Consciousness: sedated and patient cooperative  Airway & Oxygen Therapy: Patient Spontanous Breathing and Patient connected to nasal cannula oxygen  Post-op Assessment: Report given to PACU RN, Post -op Vital signs reviewed and stable and Patient moving all extremities X 4  Post vital signs: Reviewed and stable  Complications: No apparent anesthesia complications

## 2013-03-22 NOTE — Preoperative (Signed)
Beta Blockers   Reason not to administer Beta Blockers:Not Applicable 

## 2013-03-22 NOTE — Progress Notes (Signed)
UR COMPLETED  

## 2013-03-22 NOTE — Anesthesia Preprocedure Evaluation (Addendum)
Anesthesia Evaluation  Patient identified by MRN, date of birth, ID band Patient awake    Reviewed: Allergy & Precautions, H&P , NPO status , Patient's Chart, lab work & pertinent test results, reviewed documented beta blocker date and time   Airway Mallampati: II TM Distance: >3 FB Neck ROM: full    Dental  (+) Teeth Intact and Dental Advidsory Given   Pulmonary neg pulmonary ROS, shortness of breath and with exertion, asthma ,  breath sounds clear to auscultation        Cardiovascular hypertension, + CAD, + Peripheral Vascular Disease and +CHF negative cardio ROS  + Valvular Problems/Murmurs AS Rhythm:regular     Neuro/Psych  Neuromuscular disease negative neurological ROS  negative psych ROS   GI/Hepatic Neg liver ROS, hiatal hernia, GERD-  ,  Endo/Other  negative endocrine ROS  Renal/GU Renal InsufficiencyRenal disease  negative genitourinary   Musculoskeletal   Abdominal   Peds  Hematology negative hematology ROS (+)   Anesthesia Other Findings See surgeon's H&P   Reproductive/Obstetrics negative OB ROS                          Anesthesia Physical Anesthesia Plan  ASA: III  Anesthesia Plan: General   Post-op Pain Management:    Induction: Intravenous  Airway Management Planned: Oral ETT  Additional Equipment: Arterial line  Intra-op Plan:   Post-operative Plan:   Informed Consent: I have reviewed the patients History and Physical, chart, labs and discussed the procedure including the risks, benefits and alternatives for the proposed anesthesia with the patient or authorized representative who has indicated his/her understanding and acceptance.   Dental Advisory Given  Plan Discussed with: CRNA, Surgeon and Anesthesiologist  Anesthesia Plan Comments:        Anesthesia Quick Evaluation

## 2013-03-22 NOTE — Anesthesia Postprocedure Evaluation (Signed)
Anesthesia Post Note  Patient: Amy Bolton  Procedure(s) Performed: Procedure(s) (LRB): TOTAL HIP ARTHROPLASTY ANTERIOR APPROACH (Right)  Anesthesia type: General  Patient location: PACU  Post pain: Pain level controlled  Post assessment: Patient's Cardiovascular Status Stable  Last Vitals:  Filed Vitals:   03/22/13 1245  BP: 130/48  Pulse:   Temp:   Resp:     Post vital signs: Reviewed and stable  Level of consciousness: alert  Complications: No apparent anesthesia complications

## 2013-03-22 NOTE — Anesthesia Procedure Notes (Signed)
Procedure Name: Intubation Date/Time: 03/22/2013 8:38 AM Performed by: Carmela Rima Pre-anesthesia Checklist: Patient identified, Emergency Drugs available, Suction available, Patient being monitored and Timeout performed Patient Re-evaluated:Patient Re-evaluated prior to inductionOxygen Delivery Method: Circle system utilized Preoxygenation: Pre-oxygenation with 100% oxygen Intubation Type: IV induction Ventilation: Mask ventilation without difficulty Laryngoscope Size: Mac and 3 Grade View: Grade II Tube type: Oral Tube size: 7.5 mm Number of attempts: 1 Placement Confirmation: ETT inserted through vocal cords under direct vision,  positive ETCO2 and breath sounds checked- equal and bilateral Secured at: 21 cm Tube secured with: Tape Dental Injury: Teeth and Oropharynx as per pre-operative assessment

## 2013-03-23 ENCOUNTER — Encounter (HOSPITAL_COMMUNITY): Payer: Self-pay | Admitting: General Practice

## 2013-03-23 LAB — BASIC METABOLIC PANEL
CO2: 21 mEq/L (ref 19–32)
Chloride: 98 mEq/L (ref 96–112)
Creatinine, Ser: 1.01 mg/dL (ref 0.50–1.10)
Potassium: 4.8 mEq/L (ref 3.5–5.1)

## 2013-03-23 LAB — CBC
Hemoglobin: 10.1 g/dL — ABNORMAL LOW (ref 12.0–15.0)
MCV: 90.1 fL (ref 78.0–100.0)
Platelets: 212 10*3/uL (ref 150–400)
RBC: 3.23 MIL/uL — ABNORMAL LOW (ref 3.87–5.11)
RDW: 14 % (ref 11.5–15.5)
WBC: 10.7 10*3/uL — ABNORMAL HIGH (ref 4.0–10.5)

## 2013-03-23 MED ORDER — BISACODYL 10 MG RE SUPP: 10.0000 mg | Freq: Every day | RECTAL | Status: DC | PRN

## 2013-03-23 MED ORDER — FLEET ENEMA 7-19 GM/118ML RE ENEM
1.0000 | ENEMA | Freq: Every day | RECTAL | Status: DC | PRN
  Administered 2013-03-23: 1 via RECTAL
  Filled 2013-03-23: qty 1

## 2013-03-23 NOTE — Evaluation (Signed)
Occupational Therapy Evaluation Patient Details Name: Amy Bolton MRN: [id number] DOB: 1928/04/23 Today's Date: 03/23/2013 Time: 1010-1036 OT Time Calculation (min): 26 min  OT Assessment / Plan / Recommendation History of present illness 79yo F s/p R THA anterior approach   Clinical Impression   Pt in recliner upon entering room. Eager to work w/OT.See below for fx'l status. Cont'd OT services necessary to maximize pt safety, (I) and function before returning home alone.  Pt states she wants to go to [facility] in [place], [place] for rehab. Pt cooperative and well motivated. Minimal pain throughout tx session. Pt left in care of PT at end of tx session.     OT Assessment  All further OT needs can be met in the next venue of care    Follow Up Recommendations  SNF    Barriers to Discharge Decreased caregiver support (pt lives alone; need rehab prior to d/c home) pt lives alone; inpatient rehab prior to anticipated d/c home alone  Equipment Recommendations   (TBD by intake facility)    Recommendations for Other Services    Frequency  Min 1X/week    Precautions / Restrictions Precautions Precautions: None Precaution Comments: no hip precautions; anterior approach Restrictions Other Position/Activity Restrictions: WBAT RLE   Pertinent Vitals/Pain 1/10 ("More discomfort than pain. Not anywhere near what I was dealing with before.")    ADL  Eating/Feeding: Independent Grooming: Independent;Set up (seated after s/u) Lower Body Dressing: Moderate assistance Toilet Transfer: Minimal assistance Toilet Transfer Method: Stand pivot Toilet Transfer Equipment: Raised toilet seat with arms (or 3-in-1 over toilet) Toileting - Clothing Manipulation and Hygiene: Min guard Where Assessed - Toileting Clothing Manipulation and Hygiene: Sit to stand from 3-in-1 or toilet Transfers/Ambulation Related to ADLs: Min guard to/from BR w/RW; cues for sequencing and hand placement during  sit<>stand ADL Comments: Pt would likely benefit from AE for LB ADLs even though not on hip precautions for inc (I) & dec pain    OT Diagnosis: Generalized weakness;Acute pain  OT Problem List: Decreased strength;Decreased activity tolerance;Decreased knowledge of use of DME or AE;Pain OT Treatment Interventions: Self-care/ADL training;DME and/or AE instruction;Therapeutic activities;Patient/family education   OT Goals(Current goals can be found in the care plan section) Acute Rehab OT Goals Patient Stated Goal: Regain (I) and return home alone OT Goal Formulation: With patient Time For Goal Achievement: 04/15/13 Potential to Achieve Goals: Good ADL Goals Pt Will Perform Lower Body Dressing: with min guard assist Pt Will Transfer to Toilet: with supervision Pt Will Perform Toileting - Clothing Manipulation and hygiene: with supervision  Visit Information  Last OT Received On: 03/23/13 Assistance Needed: +1 History of Present Illness: 79yo F s/p R THA anterior approach       Prior Functioning     Home Living Family/patient expects to be discharged to:: Other (Comment) (rehab ) Living Arrangements: Alone Type of Home: House Home Access: Level entry Home Layout: One level Home Equipment: Cane - single point;Walker - 2 wheels;Wheelchair - Manufacturing systems engineer - built in Prior Function Level of Independence: Independent with assistive device(s) Communication Communication: No difficulties         Vision/Perception     Cognition  Cognition Arousal/Alertness: Awake/alert Behavior During Therapy: WFL for tasks assessed/performed Overall Cognitive Status: Within Functional Limits for tasks assessed    Extremity/Trunk Assessment Upper Extremity Assessment Upper Extremity Assessment: Overall WFL for tasks assessed Lower Extremity Assessment Lower Extremity Assessment: Defer to PT evaluation Cervical / Trunk Assessment Cervical / Trunk Assessment: Normal  Mobility  Bed Mobility Bed Mobility: Not assessed (pt in recliner upon entering room.) Transfers Transfers: Sit to Stand;Stand to Sit Sit to Stand: 4: Min assist Stand to Sit: 4: Min guard     Exercise     Balance     End of Session OT - End of Session Equipment Utilized During Treatment: Rolling walker Activity Tolerance: Patient tolerated treatment well Patient left: Other (comment) (w/PT ambulating)  GO     Dyane Broberg, Deidre Ala 03/23/2013, 11:59 AM

## 2013-03-23 NOTE — Evaluation (Signed)
Physical Therapy Evaluation Patient Details Name: Amy Bolton MRN: [id number] DOB: 01-Apr-1928 Today's Date: 03/23/2013 Time: 4098-1191 PT Time Calculation (min): 25 min  PT Assessment / Plan / Recommendation History of Present Illness  79yo F s/p R THA direct anterior approach  Clinical Impression  This patient presents with acute pain and decreased functional independence following the above mentioned procedure. At the time of PT eval, pt required assist only to elevate LE's back onto bed. Supine>sit not tested at this time. This patient is appropriate for skilled PT interventions to address functional limitations, improve safety and independence with functional mobility, and return to PLOF.     PT Assessment  Patient needs continued PT services    Follow Up Recommendations  SNF    Does the patient have the potential to tolerate intense rehabilitation      Barriers to Discharge Decreased caregiver support      Equipment Recommendations  None recommended by PT    Recommendations for Other Services     Frequency 7X/week    Precautions / Restrictions Precautions Precautions: Fall Precaution Comments: Direct anterior approach Restrictions Weight Bearing Restrictions: Yes RLE Weight Bearing: Weight bearing as tolerated Other Position/Activity Restrictions: WBAT RLE   Pertinent Vitals/Pain 6/10 after ambulation      Mobility  Bed Mobility Bed Mobility: Sit to Supine;Scooting to HOB Sit to Supine: 4: Min assist;HOB flat;With rail Scooting to [facility]: 4: Min guard;With rail Details for Bed Mobility Assistance: VC's for sequencing and technique. Min assist for support of R LE. Transfers Transfers: Stand to Sit Sit to Stand: 4: Min assist Stand to Sit: 4: Min guard;To bed;With upper extremity assist Details for Transfer Assistance: VC's for hand placement and safety awareness.  Ambulation/Gait Ambulation/Gait Assistance: 4: Min guard Ambulation Distance (Feet): 75  Feet Assistive device: Rolling walker Ambulation/Gait Assistance Details: VC's for increased heel strike and sequencing with RW. Pt very fatigued at end of gait training. Gait Pattern: Step-to pattern;Decreased stride length;Trunk flexed Gait velocity: Decreased Stairs: No    Exercises Total Joint Exercises Ankle Circles/Pumps: 10 reps Quad Sets: 10 reps Heel Slides: 10 reps   PT Diagnosis: Difficulty walking;Acute pain  PT Problem List: Decreased strength;Decreased range of motion;Decreased activity tolerance;Decreased balance;Decreased mobility;Decreased safety awareness;Decreased knowledge of use of DME;Pain PT Treatment Interventions: DME instruction;Gait training;Stair training;Functional mobility training;Therapeutic activities;Therapeutic exercise;Neuromuscular re-education;Patient/family education     PT Goals(Current goals can be found in the care plan section) Acute Rehab PT Goals Patient Stated Goal: Regain (I) and return home alone PT Goal Formulation: With patient Time For Goal Achievement: 03/30/13 Potential to Achieve Goals: Good  Visit Information  Last PT Received On: 03/23/13 Assistance Needed: +1 History of Present Illness: 79yo F s/p R THA direct anterior approach       Prior Functioning  Home Living Family/patient expects to be discharged to:: Skilled nursing facility [facility]) Living Arrangements: Alone Type of Home: House Home Access: Level entry Home Layout: One level Home Equipment: Cane - single point;Walker - 2 wheels;Wheelchair - Manufacturing systems engineer - built in Prior Function Level of Independence: Independent with assistive device(s) Communication Communication: No difficulties Dominant Hand: Right    Cognition  Cognition Arousal/Alertness: Awake/alert Behavior During Therapy: WFL for tasks assessed/performed Overall Cognitive Status: Within Functional Limits for tasks assessed    Extremity/Trunk Assessment Upper Extremity  Assessment Upper Extremity Assessment: Defer to OT evaluation Lower Extremity Assessment Lower Extremity Assessment: RLE deficits/detail RLE Deficits / Details: Decreased strength and AROM consistent with THA RLE: Unable to  fully assess due to pain Cervical / Trunk Assessment Cervical / Trunk Assessment: Normal   Balance Balance Balance Assessed: Yes Static Sitting Balance Static Sitting - Balance Support: Feet supported;Bilateral upper extremity supported Static Sitting - Level of Assistance: 5: Stand by assistance Static Standing Balance Static Standing - Balance Support: Bilateral upper extremity supported Static Standing - Level of Assistance: 5: Stand by assistance  End of Session PT - End of Session Equipment Utilized During Treatment: Gait belt Activity Tolerance: Patient tolerated treatment well Patient left: in bed;with call bell/phone within reach;with nursing/sitter in room Nurse Communication: Mobility status  GP     Amy Bolton 03/23/2013, 1:46 PM  Amy Bolton, PT, DPT [phone]

## 2013-03-23 NOTE — Progress Notes (Signed)
Subjective: 1 Day Post-Op Procedure(s) (LRB): TOTAL HIP ARTHROPLASTY ANTERIOR APPROACH (Right) Patient reports pain as 2 on 0-10 scale.  Sitting on side of bed.  Nurse in the room.  Objective: Vital signs in last 24 hours: Temp:  [96.1 F (35.6 C)-98.4 F (36.9 C)] 97.9 F (36.6 C) (11/13 0524) Pulse Rate:  [61-80] 80 (11/13 0524) Resp:  [9-24] 16 (11/13 0524) BP: (83-142)/(41-65) 137/60 mmHg (11/13 0524) SpO2:  [91 %-99 %] 91 % (11/13 0524) Arterial Line BP: (89-129)/(41-49) 129/49 mmHg (11/12 1430)  Intake/Output from previous day: 11/12 0701 - 11/13 0700 In: 2550 [P.O.:240; I.V.:1760; IV Piggyback:550] Out: 1800 [Urine:1400; Blood:400] Intake/Output this shift:     Recent Labs  03/23/13 0530  HGB 10.1*    Recent Labs  03/23/13 0530  WBC 10.7*  RBC 3.23*  HCT 29.1*  PLT 212    Recent Labs  03/23/13 0530  NA 133*  K 4.8  CL 98  CO2 21  BUN 19  CREATININE 1.01  GLUCOSE 184*  CALCIUM 8.3*   Right hip exam: Neurovascular intact Sensation intact distally Intact pulses distally Dorsiflexion/Plantar flexion intact Incision: dressing C/D/I Compartment soft  Assessment/Plan: 1 Day Post-Op Procedure(s) (LRB): TOTAL HIP ARTHROPLASTY ANTERIOR APPROACH (Right) Plan: Up with physical therapy WBAT. Aspirin 325 mg twice daily for DVT prophylaxis with SCDs. Discharge to SNF in one to 2 days.  Donzell Coller G 03/23/2013, 8:52 AM

## 2013-03-23 NOTE — Progress Notes (Signed)
03/23/13 PT and OT recommending SNF. Referral made to CSW. Will continue to follow for d/c needs. Jacquelynn Cree RN, BSN, CCM

## 2013-03-24 LAB — CBC
HCT: 27.4 % — ABNORMAL LOW (ref 36.0–46.0)
Hemoglobin: 9.2 g/dL — ABNORMAL LOW (ref 12.0–15.0)
MCHC: 33.6 g/dL (ref 30.0–36.0)
MCV: 90.1 fL (ref 78.0–100.0)
RBC: 3.04 MIL/uL — ABNORMAL LOW (ref 3.87–5.11)
RDW: 14.1 % (ref 11.5–15.5)
WBC: 18.4 10*3/uL — ABNORMAL HIGH (ref 4.0–10.5)

## 2013-03-24 LAB — BASIC METABOLIC PANEL
BUN: 30 mg/dL — ABNORMAL HIGH (ref 6–23)
CO2: 28 mEq/L (ref 19–32)
Chloride: 103 mEq/L (ref 96–112)
Creatinine, Ser: 1.18 mg/dL — ABNORMAL HIGH (ref 0.50–1.10)
GFR calc non Af Amer: 41 mL/min — ABNORMAL LOW (ref >90)
Glucose, Bld: 147 mg/dL — ABNORMAL HIGH (ref 70–99)
Potassium: 5 mEq/L (ref 3.5–5.1)
Sodium: 139 mEq/L (ref 135–145)

## 2013-03-24 MED ORDER — OXYCODONE-ACETAMINOPHEN 5-325 MG PO TABS: 1.0000 | ORAL_TABLET | Freq: Four times a day (QID) | ORAL | Status: AC | PRN

## 2013-03-24 MED ORDER — ASPIRIN 325 MG PO TBEC: 325.0000 mg | DELAYED_RELEASE_TABLET | Freq: Two times a day (BID) | ORAL | Status: DC

## 2013-03-24 NOTE — Progress Notes (Signed)
Clinical Social Work Department BRIEF PSYCHOSOCIAL ASSESSMENT 03/24/2013  Patient:  Amy Bolton, Amy Bolton     Account Number:  [id number]     Admit date:  03/22/2013  Clinical Social Worker:  Harless Nakayama  Date/Time:  03/24/2013 11:20 AM  Referred by:  Physician  Date Referred:  03/24/2013 Referred for  SNF Placement   Other Referral:   Interview type:  Patient Other interview type:    PSYCHOSOCIAL DATA Living Status:  ALONE Admitted from facility:   Level of care:   Primary support name:  Gildardo Pounds [phone] Primary support relationship to patient:  CHILD, ADULT Degree of support available:   Pt has supportive family    CURRENT CONCERNS Current Concerns  Post-Acute Placement   Other Concerns:    SOCIAL WORK ASSESSMENT / PLAN CSW informed pt will need ST rehab at dc. CSW spoke with pt who was already aware of recommendation. Pt is requesting [facility]. CSW informed pt of SNF referral process. Pt [place] okay [place].   Assessment/plan status:  Psychosocial Support/Ongoing Assessment of Needs Other assessment/ plan:   Information/referral to community resources:   SNF list denied    PATIENT'S/FAMILY'S RESPONSE TO PLAN OF CARE: Pt is agreeable to SNF for dc plan.       Culley Hedeen, LCSWA [phone]

## 2013-03-24 NOTE — Discharge Summary (Signed)
Patient ID: Amy Bolton MRN: [id number] DOB/AGE: 1928/01/23 79 y.o.  Admit date: 03/22/2013 Discharge date: 03/27/2013  Admission Diagnoses:  Principal Problem:   Osteoarthritis of right hip Active Problems:   Hyperkalemia   Postoperative anemia due to acute blood loss   Discharge Diagnoses:  Same  Past Medical History  Diagnosis Date  . Atypical chest pain   . Palpitation   . SOB (shortness of breath)     Episodic  . Hypertensive urgency     Episodic  . HTN (hypertension)   . Asthma     Underlying  . Kidney disease     Chronic, stage 3, with creatinine clearance estimated to be about 40  . Anxiety     Chronic  . Constipation   . GERD (gastroesophageal reflux disease)   . Hiatal hernia     Hx of  . Hyperlipidemia   . Osteoarthritis   . CHF (congestive heart failure)   . PVD (peripheral vascular disease)   . AAA (abdominal aortic aneurysm)   . Syncope and collapse     Surgeries: Procedure(s):Right TOTAL HIP ARTHROPLASTY ANTERIOR APPROACH on 03/22/2013    Discharged Condition: Improved  Hospital Course: Amy Bolton is an 79 y.o. female who was admitted 03/22/2013 for operative treatment ofOsteoarthritis of right hip. Patient has severe unremitting pain that affects sleep, daily activities, and work/hobbies. After pre-op clearance the patient was taken to the operating room on 03/22/2013 and underwent  Procedure(s):RIGHT TOTAL HIP ARTHROPLASTY ANTERIOR APPROACH.    Patient was given perioperative antibiotics:     Anti-infectives   Start     Dose/Rate Route Frequency Ordered Stop   03/22/13 1700  clindamycin (CLEOCIN) IVPB 600 mg     600 mg 100 mL/hr over 30 Minutes Intravenous Every 6 hours 03/22/13 1546 03/22/13 2341   03/22/13 0600  clindamycin (CLEOCIN) IVPB 900 mg     900 mg 100 mL/hr over 30 Minutes Intravenous On call to O.R. 03/21/13 1433 03/22/13 0841       Patient was given sequential compression devices, early ambulation, and  chemoprophylaxis to prevent DVT.  Patient benefited maximally from hospital stay and there were no complications.    Recent vital signs:  Patient Vitals for the past 24 hrs:  BP Temp Temp src Pulse Resp SpO2  03/27/13 0659 116/52 mmHg 98.5 F (36.9 C) Oral 70 18 100 %  03/26/13 2101 107/50 mmHg 97.9 F (36.6 C) Oral 70 16 96 %  03/26/13 1237 106/58 mmHg 97.4 F (36.3 C) Oral 75 16 95 %     Recent laboratory studies:   Recent Labs  03/25/13 0436 03/26/13 0340 03/27/13 0528  WBC 13.8*  --   --   HGB 8.8*  --   --   HCT 25.5*  --   --   PLT 216  --   --   NA 137 137 139  K 4.4 6.0* 4.5  CL 101 102 101  CO2 30 22 31   BUN 39* 41* 32*  CREATININE 1.44* 1.35* 1.20*  GLUCOSE 105* 83 109*  CALCIUM 8.4 8.2* 8.7     Discharge Medications:     Medication List    STOP taking these medications       furosemide 40 MG tablet  Commonly known as:  LASIX     HYDROcodone-acetaminophen 5-500 MG per tablet  Commonly known as:  VICODIN      TAKE these medications       ALPRAZolam 0.25 MG tablet  Commonly known as:  XANAX  Take 0.25 mg by mouth at bedtime. For sleep     aspirin 325 MG EC tablet  Take 1 tablet (325 mg total) by mouth 2 (two) times daily after a meal. Take x 1 month post op.     carvedilol 25 MG tablet  Commonly known as:  COREG  - Take 1 tablet (25 mg total) by mouth 2 (two) times daily with a meal.   -  Hold SBP < 105     cholecalciferol 1000 UNITS tablet  Commonly known as:  VITAMIN D  Take 2,000 Units by mouth daily.     DSS 100 MG Caps  Take 100 mg by mouth as needed.     Fish Oil 1000 MG Caps  Take 1 capsule by mouth daily.     fluticasone 50 MCG/ACT nasal spray  Commonly known as:  FLONASE  Place 2 sprays into the nose at bedtime.     lisinopril 40 MG tablet  Commonly known as:  PRINIVIL,ZESTRIL  Take 20 mg by mouth daily.     mirtazapine 30 MG tablet  Commonly known as:  REMERON  Take 30 mg by mouth at bedtime.      oxyCODONE-acetaminophen 5-325 MG per tablet  Commonly known as:  PERCOCET/ROXICET  Take 1-2 tablets by mouth every 6 (six) hours as needed for moderate pain.     pantoprazole 40 MG tablet  Commonly known as:  PROTONIX  Take 40 mg by mouth as needed.        Diagnostic Studies: Dg Chest 2 View  03/15/2013   CLINICAL DATA:  Pre-admit breast were exam, history of shortness of breath and weakness. History of CHF, prominent soft are valve, chronic bronchitis, and hypertension  EXAM: CHEST  2 VIEW  COMPARISON:  05/14/2012.  FINDINGS: There is flattening of the hemidiaphragms. No focal region of consolidation no focal infiltrates. Cardiac silhouette and visualized bony skeleton are unremarkable.  IMPRESSION: 1. COPD 2. No evidence of acute cardiopulmonary disease   Electronically Signed   By: Salome Holmes M.D.   On: 03/15/2013 15:22   Dg Hip Operative Right  03/22/2013   CLINICAL DATA:  Hip arthroplasty.  Degenerative joint disease.  EXAM: DG OPERATIVE RIGHT HIP  TECHNIQUE: A single spot fluoroscopic AP image of the right hip is submitted.  COMPARISON:  11/26/2011  FINDINGS: Right total hip arthroplasty is in place. Anatomic alignment. No breakage or loosening of the hardware.  IMPRESSION: Right total hip arthroplasty anatomically aligned.   Electronically Signed   By: Maryclare Bean M.D.   On: 03/22/2013 15:34   Dg Pelvis Portable  03/22/2013   CLINICAL DATA:  Postoperative right hip replacement  EXAM: PORTABLE PELVIS 1-2 VIEWS  COMPARISON:  None.  FINDINGS: The patient has undergone right hip joint prosthesis placement. Radiographic positioning of the prosthetic components is good. The visualized portions of the bony pelvis exhibit no acute abnormalities.  IMPRESSION: The patient has undergone right hip joint prosthesis placement with satisfactory positioning of the prosthetic components.   Electronically Signed   By: David  [place]   On: 03/22/2013 14:18   Dg Hip Portable 1 View Right  03/22/2013    CLINICAL DATA:  Right hip replacement.  EXAM: PORTABLE RIGHT HIP - 1 VIEW  COMPARISON:  MRI right hip 11/26/2011.  FINDINGS: The patient has a new right total hip arthroplasty. The device is located. No fracture is identified.  IMPRESSION: Right total hip replacement without acute abnormality.  Electronically Signed   By: Drusilla Kanner M.D.   On: 03/22/2013 15:21    Disposition: Home with HHPT  Discharge Orders   Future Appointments Provider Department Dept Phone   04/17/2013 9:00 AM Vesta Mixer, MD [facility] [phone]   Future Orders Complete By Expires   Call MD / Call 911  As directed    Comments:     If you experience chest pain or shortness of breath, CALL 911 and be transported to the hospital emergency room.  If you develope a fever above 101 F, pus (white drainage) or increased drainage or redness at the wound, or calf pain, call your surgeon's office.   Constipation Prevention  As directed    Comments:     Drink plenty of fluids.  Prune juice may be helpful.  You may use a stool softener, such as Colace (over the counter) 100 mg twice a day.  Use MiraLax (over the counter) for constipation as needed.   Diet general  As directed    Follow the hip precautions as taught in Physical Therapy  As directed    Increase activity slowly as tolerated  As directed    Weight bearing as tolerated  As directed    Questions:     Laterality:  right   Extremity:  Lower   Weight bearing as tolerated  As directed    Questions:     Laterality:     Extremity:        Follow-up Information   Follow up with GRAVES,JOHN L, MD. Schedule an appointment as soon as possible for a visit in 2 weeks.   Specialty:  Orthopedic Surgery   Contact information:   [address] [place] [address] [phone]        Signed: Matthew Folks 03/27/2013, 8:21 AM

## 2013-03-24 NOTE — Progress Notes (Addendum)
Clinical Social Work Department CLINICAL SOCIAL WORK PLACEMENT NOTE 03/24/2013  Patient:  Amy Bolton, Amy Bolton  Account Number:  [id number] Admit date:  03/22/2013  Clinical Social Worker:  Sharol Harness, Theresia Majors  Date/time:  03/24/2013 11:30 AM  Clinical Social Work is seeking post-discharge placement for this patient at the following level of care:   SKILLED NURSING   (*CSW will update this form in Epic as items are completed)   03/24/2013  Patient/family provided with [facility] Department of Clinical Social Work's list of facilities offering this level of care within the geographic area requested by the patient (or if unable, by the patient's family).  03/24/2013  Patient/family informed of their freedom to choose among providers that offer the needed level of care, that participate in Medicare, Medicaid or managed care program needed by the patient, have an available bed and are willing to accept the patient.  03/24/2013  Patient/family informed of [facility]' ownership interest in [facility], as well as of the fact that they are under no obligation to receive care at this facility.  PASARR submitted to EDS on EXISTING PASARR number received from EDS on   FL2 transmitted to all facilities in geographic area requested by pt/family on  03/24/2013 FL2 transmitted to all facilities within larger geographic area on   Patient informed that his/her managed care company has contracts with or will negotiate with  certain facilities, including the following:     Patient/family informed of bed offers received: 03/24/13 Patient chooses bed at [facility] Physician recommends and patient chooses bed at    Patient to be transferred to  on   Patient to be transferred to facility by   The following physician request were entered in Epic:   Additional Comments:  Leanne Sisler, LCSWA [phone]

## 2013-03-24 NOTE — Progress Notes (Signed)
Physical Therapy Treatment Patient Details Name: Amy Bolton MRN: [id number] DOB: 10/27/1927 Today's Date: 03/24/2013 Time: 8657-8469 PT Time Calculation (min): 28 min  PT Assessment / Plan / Recommendation  History of Present Illness 79yo F s/p R THA direct anterior approach   PT Comments   Pt making progress towards physical therapy goals. She was able to negotiate walker in the bathroom well while maintaining safety awareness, and had no LOB throughout session. Pt reports that her pain has been well managed since she began taking her pain medication, as she has been trying not to take it as much as possible. Per chart review, pt may d/c to SNF tomorrow.  Follow Up Recommendations  SNF     Does the patient have the potential to tolerate intense rehabilitation     Barriers to Discharge        Equipment Recommendations  None recommended by PT    Recommendations for Other Services    Frequency 7X/week   Progress towards PT Goals Progress towards PT goals: Progressing toward goals  Plan Current plan remains appropriate    Precautions / Restrictions Precautions Precautions: Fall Precaution Comments: Direct anterior approach Restrictions Weight Bearing Restrictions: Yes RLE Weight Bearing: Weight bearing as tolerated   Pertinent Vitals/Pain 6/10 at rest    Mobility  Bed Mobility Bed Mobility: Supine to Sit;Sitting - Scoot to [place] of Bed;Sit to Supine;Scooting to [facility] Supine to Sit: 4: Min assist;HOB elevated;With rails Sitting - Scoot to Edge of Bed: 4: Min guard Sit to Supine: 4: Min assist;HOB flat;With rail Scooting to [facility]: 4: Min guard;With rail Details for Bed Mobility Assistance: VC's for sequencing. Pt demonstrated good technique and used bed rails for support. Transfers Transfers: Sit to Stand;Stand to Sit Sit to Stand: 4: Min assist;From bed;With upper extremity assist Stand to Sit: 4: Min guard;To bed;With upper extremity assist Details for Transfer  Assistance: Pt required assist to come to full stand, but otherwise demonstrated good safety awareness and hand placement. Ambulation/Gait Ambulation/Gait Assistance: 4: Min guard Ambulation Distance (Feet): 200 Feet Assistive device: Rolling walker Ambulation/Gait Assistance Details: VC's for improved posture and encouraged step-through gait pattern. Pt took 4 standing rest breaks due to fatigue. Gait Pattern: Step-to pattern;Step-through pattern;Decreased stride length;Narrow base of support;Trunk flexed Gait velocity: Decreased Wheelchair Mobility Wheelchair Mobility: No    Exercises     PT Diagnosis:    PT Problem List:   PT Treatment Interventions:     PT Goals (current goals can now be found in the care plan section) Acute Rehab PT Goals Patient Stated Goal: Regain (I) and return home alone PT Goal Formulation: With patient Time For Goal Achievement: 03/30/13 Potential to Achieve Goals: Good  Visit Information  Last PT Received On: 03/24/13 Assistance Needed: +1 History of Present Illness: 79yo F s/p R THA direct anterior approach    Subjective Data  Subjective: "I'm still tired but I feel better today." Patient Stated Goal: Regain (I) and return home alone   Cognition  Cognition Arousal/Alertness: Awake/alert Behavior During Therapy: WFL for tasks assessed/performed Overall Cognitive Status: Within Functional Limits for tasks assessed    Balance     End of Session PT - End of Session Equipment Utilized During Treatment: Gait belt Activity Tolerance: Patient tolerated treatment well Patient left: in bed;with call bell/phone within reach;with nursing/sitter in room Nurse Communication: Mobility status   GP     [name] 03/24/2013, 3:59 PM  [name], PT, DPT [phone]

## 2013-03-24 NOTE — Op Note (Signed)
NAMEGOWRI, SUCHAN                ACCOUNT NO.:  [id number]  MEDICAL RECORD NO.:  [id number]  LOCATION:  [id number]                        FACILITY:  [facility]  PHYSICIAN:  Harvie Junior, M.D.   DATE OF BIRTH:  Sep 19, 1927  DATE OF PROCEDURE:  03/22/2013 DATE OF DISCHARGE:                              OPERATIVE REPORT   PREOPERATIVE DIAGNOSIS:  End-stage degenerative joint disease, right hip.  POSTOPERATIVE DIAGNOSIS:  End-stage degenerative joint disease, right hip.  PROCEDURE: 1. Right total hip replacement with a Corail stem and a Sector     Gription cup.  The cup is a 56 mm Sector Gription, the stem is a     size 10 Corail stem with a high offset and a -2 ball.  The     polyethylene is a neutral polyethylene for 36 mm head and head ball     is 36 mm with -2 neck length. 2. Intraoperative interpretation of fluoroscopic images.  SURGEON:  Harvie Junior, M.D.  ASSISTANT:  Marshia Ly, P.A.  ANESTHESIA:  General.  BRIEF HISTORY:  Ms. Hendriks is an 79 year old female with a history of having significant complaints of right hip pain.  She had been treated conservatively for prolonged period of time.  She had multiple steroid injections, which had helped her, but because of continued complaints of pain and failure of all conservative care, she was taken to the operating room for right total hip replacement.  We had a long discussion of treatment options preoperatively and we felt that anterior approach and we elected to do this preoperatively.  She was brought to the operating room for this procedure.  PROCEDURE:  The patient was brought to the operating room.  After adequate anesthesia was obtained with general anesthetic, the patient placed supine on the operating table.  She was moved onto the HANA traction table.  Once this was done, she was set up on the table and preoperative images were taken to assess the hip and to assess the pelvis and these lines were marked so that we  could get these images intraoperatively.  Once this was completed, attention was turned to the right hip, which was prepped and draped in the usual sterile fashion. Following this, an incision was made from the anterior superior iliac spine down towards the femur along the course of the tensor fascia.  The edge of the tensor fascia was then identified after self-retaining retractor was put in place and the muscle was taken off there with a finger.  Once this was done, retractors were put in place.  The capsule was opened, slight traction was placed, and we used the hip skid to increase this, dislocated the hip, relocated the hip, and then made a provisional neck cut under fluoroscopic imaging.  Stay sutures have been placed in the anterior and posterior portions of the leaflets of the capsule.  Once this was completed, attention was turned towards the acetabulum, which was sequentially reamed to a level of 55 and a 56 mm Sector Gription cup was used.  We initially had some thoughts of stopping at a 54 cup and they had opened a 54 mm Pinnacle style cup, which was  placed and was just too loose.  We then reamed back up to 56 and this was a cup size that we used.  The Sector Gription was used with the holes in the inferior position and got excellent bite and fit.  A neutral liner was placed and attention was turned at this point towards the stem side where after the hook was placed, foot was placed into a down and over position and external rotation.  We used a chilli pepper introducer and we were able to get lateral in the neck and then basically advanced the Corail stem, went up to a size 10, got excellent fit with a 10, and then once that was fitting well, we did a trial reduction initially with a normal head and a +0 ball, felt pretty good on reduction, but the fluoro showed it to be slightly long.  At that point, I went to a high offset stem and a -2 ball and drove the stem a little bit  deeper and did that.  At that point, the reduction to me felt definitely loose, had a little bit of shuck.  Her intraoperative fluoro was showing length being perfect based on the relationship of the greater trochanter to the femoral head.  The pelvis itself showed to be a little long and it was uncertain exactly where to go at that point. We played around with the fluoro beam a little bit to see if that is what it was and really we were having a difficult time getting it there, so at this point, I felt that we had to accept that we did not want to go well below the tip of the greater trochanter with the head and so we elected this head and neck length.  The trial Corail was removed.  The trial broach was removed.  The final poly was placed in the cup and the attention was turned back to the stem and the Corail stem was placed with a high offset and -2 36 mm hip ball.  Reduction was again undertaken.  Range of motion, which had previously been trial with 100 degrees of external rotation with attention on the ball trying to dislocate it, could not get it out, and at this point, the wound was copiously and thoroughly irrigated and suctioned dry.  The anterior capsule was closed with 1 Vicryl running stitches, the tensor fascia was closed with 1 Vicryl running after copious irrigation had been done into the wound, and then the skin was closed with 0 and 2-0 Vicryl and subcuticular stitching.  Benzoin and Steri-Strips applied.  Sterile compressive dressing was applied.  The patient was taken to recovery room, she was noted to be in satisfactory condition.  The estimated blood loss for the procedure was 300 mL.     Harvie Junior, M.D.     Ranae Plumber  D:  03/22/2013  T:  03/23/2013  Job:  [id number]

## 2013-03-24 NOTE — Progress Notes (Signed)
Subjective: 2 Days Post-Op Procedure(s) (LRB): TOTAL HIP ARTHROPLASTY ANTERIOR APPROACH (Right) Patient reports pain as 2 on 0-10 scale.   Taking po/voiding ok. BM yest.  Objective: Vital signs in last 24 hours: Temp:  [97.8 F (36.6 C)-98.6 F (37 C)] 98 F (36.7 C) (11/14 0401) Pulse Rate:  [77-89] 77 (11/14 0401) Resp:  [16] 16 (11/14 0401) BP: (114-134)/(46-65) 134/65 mmHg (11/14 0401) SpO2:  [94 %-97 %] 97 % (11/14 0401)  Intake/Output from previous day: 11/13 0701 - 11/14 0700 In: 840 [P.O.:840] Out: -  Intake/Output this shift:     Recent Labs  03/23/13 0530 03/24/13 0633  HGB 10.1* 9.2*    Recent Labs  03/23/13 0530 03/24/13 0633  WBC 10.7* 18.4*  RBC 3.23* 3.04*  HCT 29.1* 27.4*  PLT 212 199    Recent Labs  03/23/13 0530 03/24/13 0633  NA 133* 139  K 4.8 5.0  CL 98 103  CO2 21 28  BUN 19 30*  CREATININE 1.01 1.18*  GLUCOSE 184* 147*  CALCIUM 8.3* 8.4  Right hip exam: Neurovascular intact Sensation intact distally Intact pulses distally Dorsiflexion/Plantar flexion intact Incision: dressing C/D/I Compartment soft  Assessment/Plan: 2 Days Post-Op Procedure(s) (LRB): TOTAL HIP ARTHROPLASTY ANTERIOR APPROACH (Right) Plan: Discharge to SNF when bed available. Cont ASA 325mg  BID/SCDs for VTE prophylaxis. WBAT on right   Aidynn Krenn G 03/24/2013, 11:17 AM

## 2013-03-25 LAB — CBC
HCT: 25.5 % — ABNORMAL LOW (ref 36.0–46.0)
Hemoglobin: 8.8 g/dL — ABNORMAL LOW (ref 12.0–15.0)
MCHC: 34.5 g/dL (ref 30.0–36.0)
Platelets: 216 10*3/uL (ref 150–400)
RBC: 2.85 MIL/uL — ABNORMAL LOW (ref 3.87–5.11)

## 2013-03-25 LAB — BASIC METABOLIC PANEL
BUN: 39 mg/dL — ABNORMAL HIGH (ref 6–23)
CO2: 30 mEq/L (ref 19–32)
Calcium: 8.4 mg/dL (ref 8.4–10.5)
Chloride: 101 mEq/L (ref 96–112)
GFR calc non Af Amer: 33 mL/min — ABNORMAL LOW (ref >90)
Glucose, Bld: 105 mg/dL — ABNORMAL HIGH (ref 70–99)
Potassium: 4.4 mEq/L (ref 3.5–5.1)
Sodium: 137 mEq/L (ref 135–145)

## 2013-03-25 MED ORDER — ALPRAZOLAM 0.25 MG PO TABS
0.2500 mg | ORAL_TABLET | Freq: Two times a day (BID) | ORAL | Status: DC | PRN
  Administered 2013-03-25 – 2013-03-27 (×2): 0.25 mg via ORAL
  Filled 2013-03-25: qty 1

## 2013-03-25 NOTE — Progress Notes (Signed)
Subjective: 3 Days Post-Op Procedure(s) (LRB): TOTAL HIP ARTHROPLASTY ANTERIOR APPROACH (Right) Plans to go to skilled nursing facility today have fallen through. Bed is not available. Patient would like to consider going home Monday. With home health care. Activity level:  Right hip weightbearing as tolerated. Diet tolerance:  Eating, bowel movement Voiding:  Okay Patient reports pain as 2 on 0-10 scale.    Objective: Vital signs in last 24 hours: Temp:  [97.7 F (36.5 C)-98.1 F (36.7 C)] 98 F (36.7 C) (11/15 0524) Pulse Rate:  [68-76] 70 (11/15 0524) Resp:  [16-17] 16 (11/15 0524) BP: (120-126)/(54-70) 120/66 mmHg (11/15 0524) SpO2:  [95 %-98 %] 95 % (11/15 0524)  Labs:  Recent Labs  03/23/13 0530 03/24/13 0633 03/25/13 0436  HGB 10.1* 9.2* 8.8*    Recent Labs  03/24/13 0633 03/25/13 0436  WBC 18.4* 13.8*  RBC 3.04* 2.85*  HCT 27.4* 25.5*  PLT 199 216    Recent Labs  03/24/13 0633 03/25/13 0436  NA 139 137  K 5.0 4.4  CL 103 101  CO2 28 30  BUN 30* 39*  CREATININE 1.18* 1.44*  GLUCOSE 147* 105*  CALCIUM 8.4 8.4   No results found for this basename: LABPT, INR,  in the last 72 hours  Physical Exam:  Neurologically intact ABD soft Neurovascular intact Sensation intact distally Intact pulses distally Dorsiflexion/Plantar flexion intact Incision: dressing C/D/I No cellulitis present Compartment soft  Assessment/Plan:  3 Days Post-Op Procedure(s) (LRB): TOTAL HIP ARTHROPLASTY ANTERIOR APPROACH (Right) Advance diet Up with therapy Discharge home with home health most likely Monday if okay with therapy. Home health care will need to be arranged.  ASA 3251 pill twice a day/SCDs. Continue physical therapy weightbearing as tolerated.    Ronnisha Felber R 03/25/2013, 10:26 AM

## 2013-03-25 NOTE — Progress Notes (Addendum)
PT Cancellation Note  Patient Details Name: Amy Bolton MRN: [id number] DOB: 11-18-1927   Cancelled Treatment:    Reason Eval/Treat Not Completed: Patient declined, stating she does not feel well. Reports having an episode of simultaneous diarrhea and vomit earlier. Plan is now to d/c home Monday with HHPT to follow, as bed is not available for d/c to SNF today. Will attempt again later this afternoon to initiate stair training.   [name] 03/25/2013, 1:14 PM  [name], PT, DPT [phone]

## 2013-03-25 NOTE — Progress Notes (Addendum)
Clinical Social Worker (CSW) spoke with daughter of patient Amy Bolton [phone] who reported that she would stay with the patient and help her get around at home. CSW explained to daughter that case management sets up home health. CSW gave weekend case managemer daughter's phone number. If patient is medically stable to be D/C Monday she will go home with home health. Case management, nursing, daughter, and [facility] (skilled nursing facility) are aware of above. Please reconsult if further social work needs arise. CSW signing off.   Jetta Lout, LCSWA Weekend CSW [phone]

## 2013-03-25 NOTE — Progress Notes (Signed)
Physical Therapy Treatment Patient Details Name: Amy Bolton MRN: [id number] DOB: 1927-11-01 Today's Date: 03/25/2013 Time: 6962-9528 PT Time Calculation (min): 28 min  PT Assessment / Plan / Recommendation  History of Present Illness 79yo F s/p R THA direct anterior approach   PT Comments   Pt progressing towards physical therapy goals. D/C plan changed to home possibly on Monday with HHPT to follow. Pt has no steps to enter her home and no steps inside her home. She will have 24 hour assist available between sister and daughter. Some assist still required for bed mobility, however is showing improvement overall with functional activity.   Follow Up Recommendations  Home health PT     Does the patient have the potential to tolerate intense rehabilitation     Barriers to Discharge        Equipment Recommendations  None recommended by PT    Recommendations for Other Services    Frequency 7X/week   Progress towards PT Goals Progress towards PT goals: Progressing toward goals  Plan Current plan remains appropriate    Precautions / Restrictions Precautions Precautions: Fall Precaution Comments: Direct anterior approach Restrictions Weight Bearing Restrictions: Yes RLE Weight Bearing: Weight bearing as tolerated   Pertinent Vitals/Pain Pt reports feeling better than earlier today.     Mobility  Bed Mobility Bed Mobility: Supine to Sit;Sitting - Scoot to Edge of Bed Supine to Sit: 4: Min assist;HOB flat;With rails Sitting - Scoot to Edge of Bed: 4: Min guard Details for Bed Mobility Assistance: Min assist for movement of RLE towards EOB, as well as for trunk control when coming to full upright sit. Heavy reliance on bed rails.  Transfers Transfers: Sit to Stand;Stand to Sit Sit to Stand: 4: Min guard;From bed;With upper extremity assist Stand to Sit: 4: Min guard;To chair/3-in-1;With upper extremity assist Details for Transfer Assistance: Pt demonstrated proper hand  placement and safety awareness.  Ambulation/Gait Ambulation/Gait Assistance: 4: Min guard Ambulation Distance (Feet): 250 Feet Assistive device: Rolling walker Ambulation/Gait Assistance Details: VC's for improved posture and fluidity of walker movement.  Gait Pattern: Step-through pattern;Decreased stride length Gait velocity: Decreased    Exercises     PT Diagnosis:    PT Problem List:   PT Treatment Interventions:     PT Goals (current goals can now be found in the care plan section) Acute Rehab PT Goals Patient Stated Goal: Regain (I) and return home alone PT Goal Formulation: With patient Time For Goal Achievement: 03/30/13 Potential to Achieve Goals: Good  Visit Information  Last PT Received On: 03/25/13 Assistance Needed: +1 Reason Eval/Treat Not Completed: Patient declined, no reason specified History of Present Illness: 79yo F s/p R THA direct anterior approach    Subjective Data  Subjective: "I am feeling better if you want to do something now." Patient Stated Goal: Regain (I) and return home alone   Cognition  Cognition Arousal/Alertness: Awake/alert Behavior During Therapy: WFL for tasks assessed/performed Overall Cognitive Status: Within Functional Limits for tasks assessed    Balance     End of Session PT - End of Session Equipment Utilized During Treatment: Gait belt Activity Tolerance: Patient tolerated treatment well Patient left: in chair;with call bell/phone within reach;with family/visitor present Nurse Communication: Mobility status   GP     [name] 03/25/2013, 3:12 PM  [name], PT, DPT [phone]

## 2013-03-26 LAB — BASIC METABOLIC PANEL
BUN: 41 mg/dL — ABNORMAL HIGH (ref 6–23)
CO2: 22 mEq/L (ref 19–32)
Chloride: 102 mEq/L (ref 96–112)
Creatinine, Ser: 1.35 mg/dL — ABNORMAL HIGH (ref 0.50–1.10)
GFR calc non Af Amer: 35 mL/min — ABNORMAL LOW (ref >90)
Glucose, Bld: 83 mg/dL (ref 70–99)
Sodium: 137 mEq/L (ref 135–145)

## 2013-03-26 MED ORDER — SODIUM CHLORIDE 0.9 % IV SOLN: INTRAVENOUS | Status: DC

## 2013-03-26 NOTE — Progress Notes (Signed)
Physical Therapy Treatment Patient Details Name: NHI BUTRUM MRN: [id number] DOB: May 08, 1928 Today's Date: 03/26/2013 Time: 2725-3664 PT Time Calculation (min): 24 min  PT Assessment / Plan / Recommendation  History of Present Illness 79yo F s/p R THA direct anterior approach   PT Comments   Pt. Making good gains with mobility though still needing assist to sit up from supine and vice versa.  Hopefully will reach supervision in next session or two in preparation for DC home.  Follow Up Recommendations  Home health PT     Does the patient have the potential to tolerate intense rehabilitation     Barriers to Discharge        Equipment Recommendations  None recommended by PT    Recommendations for Other Services    Frequency 7X/week   Progress towards PT Goals Progress towards PT goals: Progressing toward goals  Plan Current plan remains appropriate    Precautions / Restrictions Precautions Precautions: Fall Precaution Comments: Direct anterior approach Restrictions Weight Bearing Restrictions: Yes RLE Weight Bearing: Weight bearing as tolerated Other Position/Activity Restrictions: WBAT RLE   Pertinent Vitals/Pain See vitals tab     Mobility  Bed Mobility Bed Mobility: Supine to Sit;Sitting - Scoot to Edge of Bed;Sit to Supine Supine to Sit: 4: Min assist;HOB flat Sitting - Scoot to Edge of Bed: 5: Supervision Sit to Supine: 4: Min assist;HOB flat;With rail Details for Bed Mobility Assistance: pt. needed min assist to raise shoulders to sitting position but was able to managae R LE in moving to sitting.  She needed min assist for R LE back to supine from sitting Transfers Transfers: Sit to Stand;Stand to Sit Sit to Stand: 4: Min guard;From bed;With upper extremity assist Stand to Sit: 4: Min guard;To bed;With upper extremity assist Details for Transfer Assistance: good technique Ambulation/Gait Ambulation/Gait Assistance: 4: Min guard Ambulation Distance (Feet):  250 Feet Assistive device: Rolling walker Ambulation/Gait Assistance Details: good technique demonstrated with gait; min guarad for safety Gait Pattern: Step-through pattern;Decreased stride length Gait velocity: Decreased Stairs: No    Exercises     PT Diagnosis:    PT Problem List:   PT Treatment Interventions:     PT Goals (current goals can now be found in the care plan section) Acute Rehab PT Goals Patient Stated Goal: Regain (I) and return home alone  Visit Information  Last PT Received On: 03/26/13 Assistance Needed: +1 History of Present Illness: 79yo F s/p R THA direct anterior approach    Subjective Data  Subjective: They have to put my IV back in my arm because my potassium is messed up Patient Stated Goal: Regain (I) and return home alone   Cognition  Cognition Arousal/Alertness: Awake/alert Behavior During Therapy: WFL for tasks assessed/performed Overall Cognitive Status: Within Functional Limits for tasks assessed    Balance     End of Session PT - End of Session Equipment Utilized During Treatment: Gait belt Activity Tolerance: Patient tolerated treatment well Patient left: in bed;with call bell/phone within reach Nurse Communication: Mobility status   GP     Ferman Hamming 03/26/2013, 3:28 PM Weldon Picking PT Acute Rehab Services [phone] Beeper [phone]

## 2013-03-26 NOTE — Progress Notes (Signed)
   CARE MANAGEMENT NOTE 03/26/2013  Patient:  Amy Bolton, Amy Bolton   Account Number:  [id number]  Date Initiated:  03/23/2013  Documentation initiated by:  [facility]  Subjective/Objective Assessment:   admitted postop rt THA     Action/Plan:   PT/OT evals-recommending SNF   Anticipated DC Date:  03/25/2013   Anticipated DC Plan:  SKILLED NURSING FACILITY  In-house referral  Clinical Social Worker      DC Planning Services  CM consult      Choice offered to / List presented to:  C-4 Adult Children   DME arranged  3-N-1  Levan Hurst      DME agency  [facility]        Status of service:  In process, will continue to follow Medicare Important Message given?   (If response is "NO", the following Medicare IM given date fields will be blank) Date Medicare IM given:   Date Additional Medicare IM given:    Discharge Disposition:  HOME W HOME HEALTH SERVICES  Per UR Regulation:    If discussed at Long Length of Stay Meetings, dates discussed:    Comments:  03/26/13 CM spoke with daughter, Kendal Hymen [phone] who requests [facility] for [facility] services.  Address verified but Kendal Hymen requesting her number be used as contact number.  Also, Kendal Hymen requests a RW with 4 wheels as pt's RW at home only has 2 wheels and a 3n1.  Sticky note left for MD to place DME orders and HH orders.  Plan for discharge 03/27/13. Referral faxed to [facility] with note of Contact number and to look out for EPIC orders.  Will continue to follow for DC needs.  Freddy Jaksch, BSN, Caryl Ada [phone].  03/23/13 PT and OT recommending SNF. Referral made to CSW. Will continue to follow for d/c needs. Jacquelynn Cree RN, BSN, CCM

## 2013-03-26 NOTE — Progress Notes (Signed)
Subjective: 4 Days Post-Op Procedure(s) (LRB): TOTAL HIP ARTHROPLASTY ANTERIOR APPROACH (Right) Patient progressing slowly with physical therapy. Would like to be discharged home on to 2 days with home health care. Recent potassium 6.0. Pain managed by medications. Activity level:  Continue to work with therapy weightbearing as tolerated right hip. Diet tolerance:  Eating Voiding:  Voiding Patient reports pain as 2 on 0-10 scale.    Objective: Vital signs in last 24 hours: Temp:  [97.6 F (36.4 C)-98 F (36.7 C)] 97.9 F (36.6 C) (11/16 0529) Pulse Rate:  [69-70] 70 (11/16 0529) Resp:  [16] 16 (11/16 0529) BP: (93-126)/(48-52) 126/52 mmHg (11/16 0529) SpO2:  [91 %-96 %] 96 % (11/16 0529)  Labs:  Recent Labs  03/24/13 0633 03/25/13 0436  HGB 9.2* 8.8*    Recent Labs  03/24/13 0633 03/25/13 0436  WBC 18.4* 13.8*  RBC 3.04* 2.85*  HCT 27.4* 25.5*  PLT 199 216    Recent Labs  03/25/13 0436 03/26/13 0340  NA 137 137  K 4.4 6.0*  CL 101 102  CO2 30 22  BUN 39* 41*  CREATININE 1.44* 1.35*  GLUCOSE 105* 83  CALCIUM 8.4 8.2*   No results found for this basename: LABPT, INR,  in the last 72 hours  Physical Exam:  Neurologically intact ABD soft Neurovascular intact Sensation intact distally Intact pulses distally Dorsiflexion/Plantar flexion intact Incision: dressing C/D/I No cellulitis present Compartment soft Dressing dry. Good neurovascular status.  Assessment/Plan:  4 Days Post-Op Procedure(s) (LRB): TOTAL HIP ARTHROPLASTY ANTERIOR APPROACH (Right) Advance diet Up with therapy Plan for discharge tomorrow with home health care. Normal saline IV KVO. Will repeat B. met tomorrow to check potassium. If she continues to make progress potential discharge tomorrow. Home with home therapy.    Ericka Marcellus R 03/26/2013, 11:07 AM

## 2013-03-27 ENCOUNTER — Encounter (HOSPITAL_COMMUNITY): Payer: Self-pay | Admitting: Orthopedic Surgery

## 2013-03-27 DIAGNOSIS — D62 Acute posthemorrhagic anemia: Secondary | ICD-10-CM | POA: Diagnosis not present

## 2013-03-27 DIAGNOSIS — E875 Hyperkalemia: Secondary | ICD-10-CM | POA: Diagnosis not present

## 2013-03-27 LAB — BASIC METABOLIC PANEL
BUN: 32 mg/dL — ABNORMAL HIGH (ref 6–23)
CO2: 31 mEq/L (ref 19–32)
Calcium: 8.7 mg/dL (ref 8.4–10.5)
Chloride: 101 mEq/L (ref 96–112)
Creatinine, Ser: 1.2 mg/dL — ABNORMAL HIGH (ref 0.50–1.10)
Glucose, Bld: 109 mg/dL — ABNORMAL HIGH (ref 70–99)
Potassium: 4.5 mEq/L (ref 3.5–5.1)

## 2013-03-27 NOTE — Progress Notes (Signed)
Discharge instructions reviewed with patient. IV removed. Vital signs stable. Prescriptions and discharge packet given to patient. Patient escorted out by family and RN via wheelchair.

## 2013-03-27 NOTE — Progress Notes (Signed)
Physical Therapy Treatment Patient Details Name: Amy Bolton MRN: [id number] DOB: 1928/01/23 Today's Date: 03/27/2013 Time: 4098-1191 PT Time Calculation (min): 23 min  PT Assessment / Plan / Recommendation  History of Present Illness 79yo F s/p R THA direct anterior approach   PT Comments   Focus of session today was bed mobility and safe transition to/from EOB. Bed adjusted to simulate home environment, and pt did not use rails for support. Min assist still needed to elevate LE's into bed when transitioning to supine, however pt states she will have adequate help at home to assist her with this at d/c. Pt did show progress with the ability to transfer supine>sit with supervision. Stair training deferred as pt has no steps to enter home.   Follow Up Recommendations  Home health PT     Does the patient have the potential to tolerate intense rehabilitation     Barriers to Discharge        Equipment Recommendations  Rolling walker with 5" wheels;3in1 (PT)    Recommendations for Other Services    Frequency 7X/week   Progress towards PT Goals Progress towards PT goals: Progressing toward goals  Plan Current plan remains appropriate    Precautions / Restrictions Precautions Precautions: Fall Precaution Comments: Direct anterior approach Restrictions Weight Bearing Restrictions: Yes RLE Weight Bearing: Weight bearing as tolerated Other Position/Activity Restrictions: WBAT RLE   Pertinent Vitals/Pain 4/10 pain during ambulation     Mobility  Bed Mobility Bed Mobility: Supine to Sit;Sitting - Scoot to [place] of Bed;Sit to Supine;Scooting to [facility] Supine to Sit: 5: Supervision;HOB flat Sitting - Scoot to Edge of Bed: 5: Supervision Sit to Supine: 4: Min assist;HOB flat Scooting to [facility]: 5: Supervision Details for Bed Mobility Assistance: Continues to need assist lifting RLE up into bed during sit>supine transfer. Bed mobility practiced to simulate home environment with HOB flat and  no bed rail.  Transfers Transfers: Sit to Stand;Stand to Sit Sit to Stand: 5: Supervision;From bed;With upper extremity assist Stand to Sit: 5: Supervision;To bed;With upper extremity assist Details for Transfer Assistance: Proper hand placement and safety awareness. Ambulation/Gait Ambulation/Gait Assistance: 5: Supervision Ambulation Distance (Feet): 300 Feet Assistive device: Rolling walker Ambulation/Gait Assistance Details: VC's for improved posture and hand placement on walker. Gait Pattern: Step-through pattern;Decreased stride length Gait velocity: Decreased Stairs: No    Exercises     PT Diagnosis:    PT Problem List:   PT Treatment Interventions:     PT Goals (current goals can now be found in the care plan section) Acute Rehab PT Goals Patient Stated Goal: Regain (I) and return home alone PT Goal Formulation: With patient Time For Goal Achievement: 03/30/13 Potential to Achieve Goals: Good  Visit Information  Last PT Received On: 03/27/13 Assistance Needed: +1 History of Present Illness: 79yo F s/p R THA direct anterior approach    Subjective Data  Subjective: "I am supposed to go home today." Patient Stated Goal: Regain (I) and return home alone   Cognition  Cognition Arousal/Alertness: Awake/alert Behavior During Therapy: WFL for tasks assessed/performed Overall Cognitive Status: Within Functional Limits for tasks assessed    Balance     End of Session PT - End of Session Equipment Utilized During Treatment: Gait belt Activity Tolerance: Patient tolerated treatment well Patient left: in bed;with call bell/phone within reach Nurse Communication: Mobility status   GP     Amy Bolton 03/27/2013, 9:59 AM  Amy Bolton, PT, DPT [phone]

## 2013-03-27 NOTE — Progress Notes (Signed)
03/27/13 Contacted Shatera Rennert with [facility] and set up HHPT. Contacted Justin with [facility] and requested rolling walker and 3N1 be delivered to patient's room prior to discharge today. Jacquelynn Cree RN, BSN, CCM

## 2013-03-27 NOTE — Progress Notes (Signed)
Subjective: 5 Days Post-Op Procedure(s) (LRB): TOTAL HIP ARTHROPLASTY ANTERIOR APPROACH (Right) Patient reports pain as 3 on 0-10 scale.   Progressing with PT.  Objective: Vital signs in last 24 hours: Temp:  [97.4 F (36.3 C)-98.5 F (36.9 C)] 98.5 F (36.9 C) (11/17 0659) Pulse Rate:  [70-75] 70 (11/17 0659) Resp:  [16-18] 18 (11/17 0659) BP: (106-116)/(50-58) 116/52 mmHg (11/17 0659) SpO2:  [95 %-100 %] 100 % (11/17 0659)  Intake/Output from previous day: 11/16 0701 - 11/17 0700 In: 120 [P.O.:120] Out: -  Intake/Output this shift: Total I/O In: 240 [P.O.:240] Out: -    Recent Labs  03/25/13 0436  HGB 8.8*    Recent Labs  03/25/13 0436  WBC 13.8*  RBC 2.85*  HCT 25.5*  PLT 216    Recent Labs  03/26/13 0340 03/27/13 0528  NA 137 139  K 6.0* 4.5  CL 102 101  CO2 22 31  BUN 41* 32*  CREATININE 1.35* 1.20*  GLUCOSE 83 109*  CALCIUM 8.2* 8.7   Right hip exam: Neurovascular intact Sensation intact distally Intact pulses distally Incision: dressing C/D/I Compartment soft  Assessment/Plan: 5 Days Post-Op Procedure(s) (LRB): TOTAL HIP ARTHROPLASTY ANTERIOR APPROACH (Right) Hyperkalemia   Resolved ABLA   Stable. Plan: Discharge home with home health Pt states that oral iron bothers her stomach and she won't take it.  Amy Bolton 03/27/2013, 8:16 AM

## 2013-04-17 ENCOUNTER — Ambulatory Visit: Payer: Medicare Other | Admitting: Cardiovascular Disease

## 2013-05-22 ENCOUNTER — Ambulatory Visit (INDEPENDENT_AMBULATORY_CARE_PROVIDER_SITE_OTHER): Payer: Medicare Other | Admitting: Cardiovascular Disease

## 2013-05-22 ENCOUNTER — Encounter: Payer: Self-pay | Admitting: Cardiovascular Disease

## 2013-05-22 VITALS — BP 126/74 | HR 70 | Ht 63.0 in | Wt 185.5 lb

## 2013-05-22 DIAGNOSIS — I350 Nonrheumatic aortic (valve) stenosis: Secondary | ICD-10-CM

## 2013-05-22 DIAGNOSIS — E8779 Other fluid overload: Secondary | ICD-10-CM

## 2013-05-22 DIAGNOSIS — R0602 Shortness of breath: Secondary | ICD-10-CM

## 2013-05-22 DIAGNOSIS — I10 Essential (primary) hypertension: Secondary | ICD-10-CM

## 2013-05-22 DIAGNOSIS — I359 Nonrheumatic aortic valve disorder, unspecified: Secondary | ICD-10-CM

## 2013-05-22 DIAGNOSIS — E8770 Fluid overload, unspecified: Secondary | ICD-10-CM

## 2013-05-22 NOTE — Assessment & Plan Note (Signed)
She has moderate AS.  She is asymptomatic from an AS standpoint.  Continue to follow.  Will see her in 6 months.

## 2013-05-22 NOTE — Progress Notes (Signed)
Amy Bolton Date of Birth  February 16, 1928 [facility]     [place]  [address]    [address]   [address] [place], [place]  [address]    [place], [place]  [address] [phone]  Fax  [phone]  [phone]  Fax [phone]  Problem list: 1. Aortic stenosis - Echo May 2013 - moderate AS , mean gradient of 26 2. Diastolic dysfunction 3. Renal insufficiency 4. Hyperlipidemia-intolerant to statin therapy 5. Minimal coronary artery disease by cardiac cath 2007 6 Hypertension 7. Chronic back pain 8.Peripheral vascular disease - s/p stenting (Dr. Wyn Quaker at [facility] )  History of Present Illness:  Amy Bolton is a 80 y.o. female with hx of diastolic dysfunction, mild aortic stenosis, and mild CAD.  She walks regularly.    She fell and injured her leg several months ago. She is scheduled have an an MRI of her leg and back later today.  Dec. 18, 2013 She has had a rough year.  She fell this past spring and injured her back.  She has had multiple falling episodes.  She was admitted with CHF which resulted in a SNF stay.   She had stenting of her iliac arteries.  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  November 02, 2012:  [name] is doing well.  No CP , no dyspnea.  Her rectocele has improved since she stopped her Welchol.  Her constipation is better.    She does not exercise much.  She is having problems with her right hip and is not able to walk much.  Jan. 12, 2015:  [name] fell and injured her right hip .  She had a hip replacement  She  is feeling well.    She has some chronic leg edema.  She has vein surgery in both legs.     Current Outpatient Prescriptions on File Prior to Visit  Medication Sig Dispense Refill  . ALPRAZolam (XANAX) 0.25 MG tablet Take 0.25 mg by mouth at bedtime. For sleep      . carvedilol (COREG) 25 MG tablet Take 1 tablet (25 mg total) by mouth 2 (two) times daily with a meal.   Hold SBP < 105  60 tablet  3  . cholecalciferol  (VITAMIN D) 1000 UNITS tablet Take 2,000 Units by mouth daily.       Amy Bolton Sodium (DSS) 100 MG CAPS Take 100 mg by mouth as needed.      . fluticasone (FLONASE) 50 MCG/ACT nasal spray Place 2 sprays into the nose at bedtime.      . mirtazapine (REMERON) 30 MG tablet Take 30 mg by mouth at bedtime.      . Omega-3 Fatty Acids (FISH OIL) 1000 MG CAPS Take 1 capsule by mouth daily.      Marland Kitchen oxyCODONE-acetaminophen (PERCOCET/ROXICET) 5-325 MG per tablet Take 1-2 tablets by mouth every 6 (six) hours as needed for moderate pain.  50 tablet  0  . pantoprazole (PROTONIX) 40 MG tablet Take 40 mg by mouth as needed.        No current facility-administered medications on file prior to visit.    Allergies  Allergen Reactions  . Ampicillin Other (See Comments)    Caused colitis  . Penicillins Other (See Comments)    Caused colitis  . Sulfa Antibiotics Diarrhea and Other (See Comments)    "muscles drew up"    Past Medical History  Diagnosis Date  . Atypical chest  pain   . Palpitation   . SOB (shortness of breath)     Episodic  . Hypertensive urgency     Episodic  . HTN (hypertension)   . Asthma     Underlying  . Kidney disease     Chronic, stage 3, with creatinine clearance estimated to be about 40  . Anxiety     Chronic  . Constipation   . GERD (gastroesophageal reflux disease)   . Hiatal hernia     Hx of  . Hyperlipidemia   . Osteoarthritis   . CHF (congestive heart failure)   . PVD (peripheral vascular disease)   . AAA (abdominal aortic aneurysm)   . Syncope and collapse     Past Surgical History  Procedure Laterality Date  . US echocardiography  10-12-08    2-D done at [facility] on October 12, 2008 showed some diastolic dysfunction, EF 55%, mild tricuspid regurgitation, right ventricular systolic pressure elevated a 40-50 mmHg, mild valvular aortic stenosis noted.  . Cardiovascular stress test  04-19-2008    showing EF 69% with otherwise a normal stress  nuclear study  . Tonsillectomy    . Adenoidectomy    . Spine surgery      2001, lumbar at the L4-5 level; in 2004, C-spine operation with fusion at the  C5-6 level; in 2004, left thumb CMC joint operation; in 2005 bilateral rotator cuff surgeries  . Bladder suspension      surgery  . Cardiac catheterization  01-2006    showing minimal proximal left anterior descending artery disease. showing less than 50% stenosis of the proximal LAD and otherwise normal coronary arteries,  . Back surgery      x2  . Iliac artery stent  03/07/2012  . Tonsillectomy    . Appendectomy    . Cholecystectomy    . Eye surgery Bilateral     cataracts  . Abdominal hysterectomy    . Colonoscopy w/ biopsies and polypectomy    . Total hip arthroplasty Right 03/22/2013    Dr Luiz Blare  . Total hip arthroplasty Right 03/22/2013    Procedure: TOTAL HIP ARTHROPLASTY ANTERIOR APPROACH;  Surgeon: Harvie Junior, MD;  Location: [facility] OR;  Service: Orthopedics;  Laterality: Right;  . Total hip arthroplasty Right     History  Smoking status  . Never Smoker   Smokeless tobacco  . Never Used    Comment: Quit 6yrs    History  Alcohol Use No    Family History  Problem Relation Age of Onset  . Family history unknown: Yes    Reviw of Systems:  Reviewed in the HPI.  All other systems are negative.  Physical Exam: Blood pressure 126/74, pulse 70, height 5\' 3"  (1.6 m), weight 185 lb 8 oz (84.142 kg). General: Well developed, well nourished, in no acute distress.  Head: Normocephalic, atraumatic, sclera non-icteric, mucus membranes are moist,   Neck: Supple. Carotids are 2 + with radiation of her systolic murmur into her carotid arteries.  No JVD  Lungs: Clear bilaterally to auscultation.  Heart: regular rate.  normal  S1 S2. There is a 1-2/6 systolic murmur at the left sternal border.  Abdomen: Soft, non-tender, non-distended with normal bowel sounds. No hepatomegaly. No rebound/guarding. No masses.  Msk:   Strength and tone are normal  Extremities: No clubbing or cyanosis. No edema.  Distal pedal pulses are 2+ and equal bilaterally.  Neuro: Alert and oriented X 3. Moves all extremities spontaneously.  Psych:  Responds to questions appropriately with a normal affect.  ECG: Jan. 12, 2015:  NSR with occasioal PVCs NS T wave abnormality. Assessment / Plan:

## 2013-05-22 NOTE — Assessment & Plan Note (Signed)
I have encouraged her to use leg elevation and ambulation to help with the leg edema and to try to resist taking more Lasix if possible.

## 2013-05-22 NOTE — Assessment & Plan Note (Signed)
Her BP is well controlled.    She does have some leg edema.  I suspect the edema is largely due to her relative inactivity since her hip replacement.  I have given her information on the [employer] leg rest..  I have have encouraged her to elevate her legs several times a day.  Exercise as tolerated.

## 2013-05-22 NOTE — Patient Instructions (Addendum)
  I have recommended that you elevate your legs.  An ideal way is to get a [employer] Leg rest - invented by one of our local vascular surgeons.  Website:  [email or web address]      Your physician wants you to follow-up in: 6 months. You will receive a reminder letter in the mail two months in advance. If you don't receive a letter, please call our office to schedule the follow-up appointment.  Your physician recommends that you continue on your current medications as directed. Please refer to the Current Medication list given to you today.

## 2013-05-24 IMAGING — CT CT ABD-PELV W/ CM
2 of 5 series · 13 of 32 positions shown, 18 images · IV contrast (agent unspecified)
Comparison: 01/07/2005

CLINICAL DATA: Bilateral upper abdominal pain, near-syncope,
history abdominal aortic aneurysm, GERD, hiatal hernia

CT ABDOMEN AND PELVIS WITH CONTRAST
TECHNIQUE: Multidetector CT imaging of the abdomen and pelvis was
performed following the standard protocol during bolus
administration of intravenous contrast. Sagittal and coronal MPR
images reconstructed from axial data set.
Contrast:  80 ml Amnipaque-WUU IV. No oral contrast administered.

[Series 2: routine abdomen · axial · 0.92mm/px · z∈[-352,-72]mm · 5 of 86 slices shown, 10 images]
[im 15/86  soft-tissue]
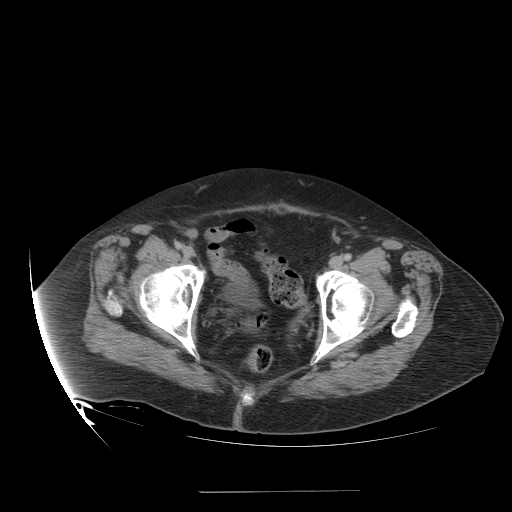
[im 15/86  bone]
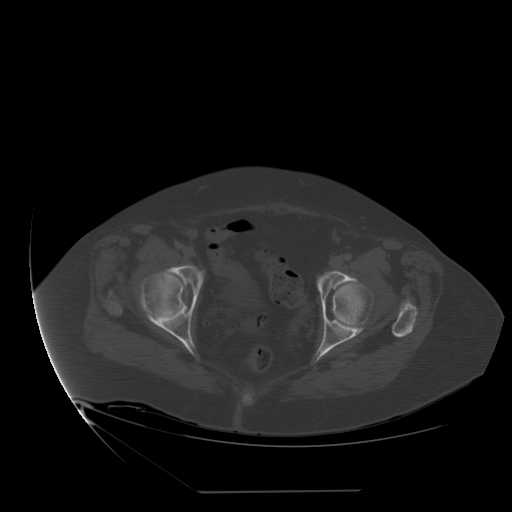
[im 29/86  soft-tissue]
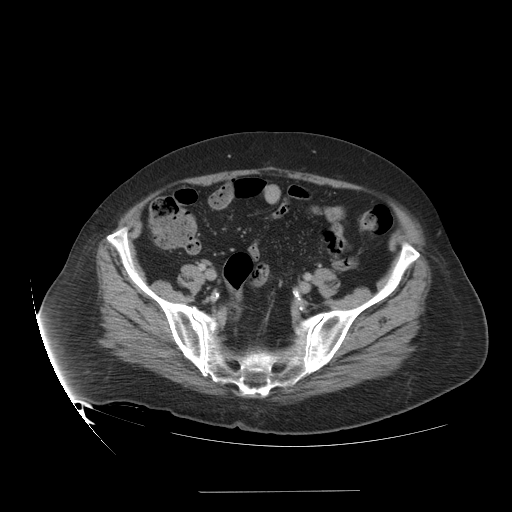
[im 29/86  lung]
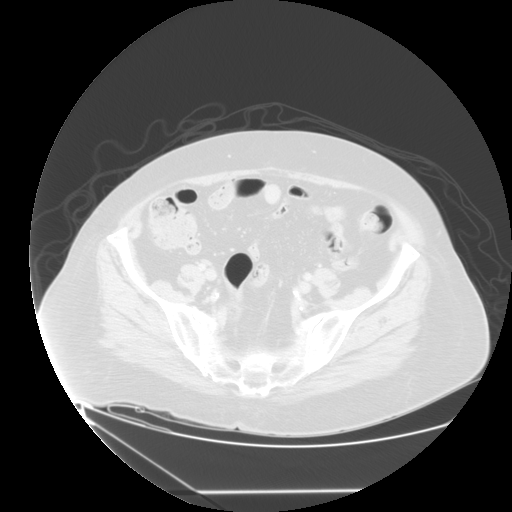
[im 43/86  soft-tissue]
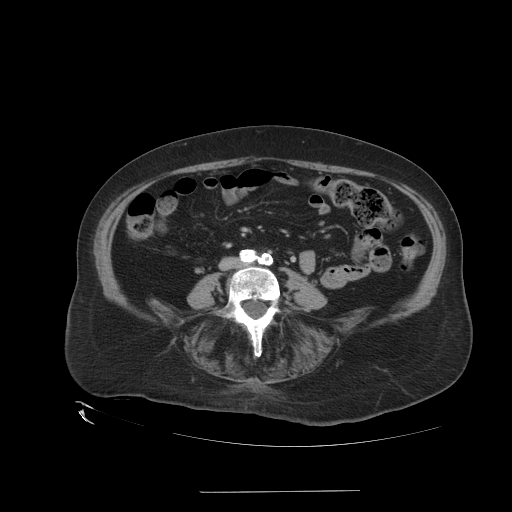
[im 43/86  lung]
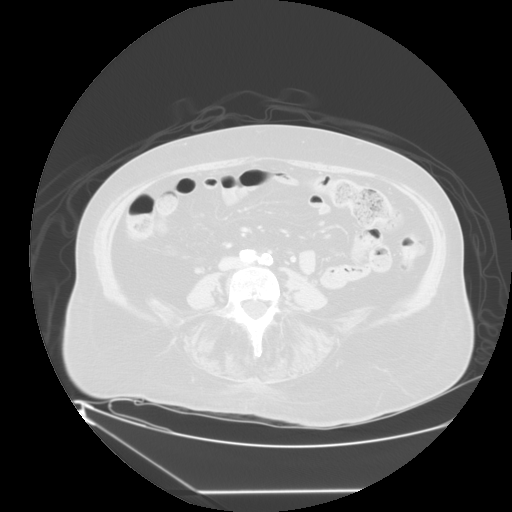
[im 57/86  soft-tissue]
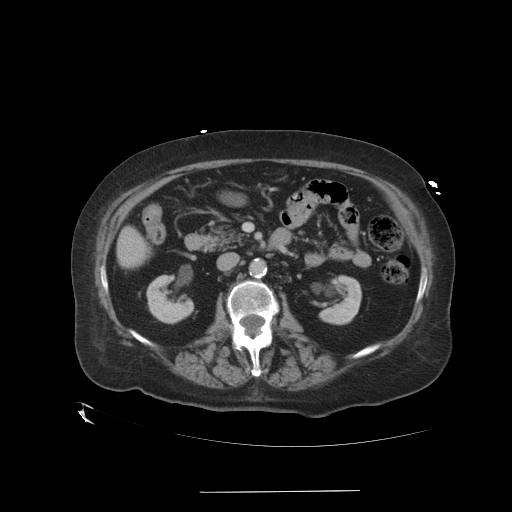
[im 57/86  lung]
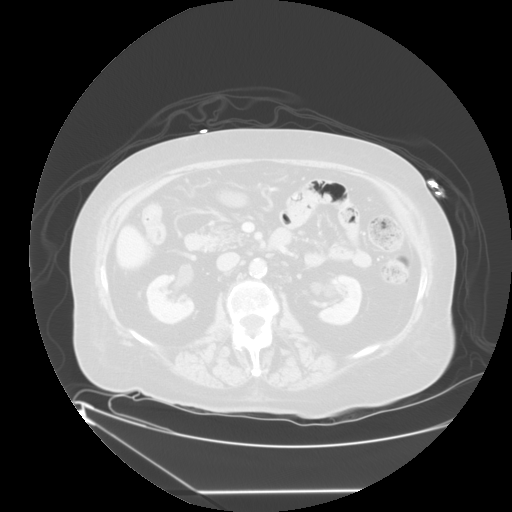
[im 71/86  soft-tissue]
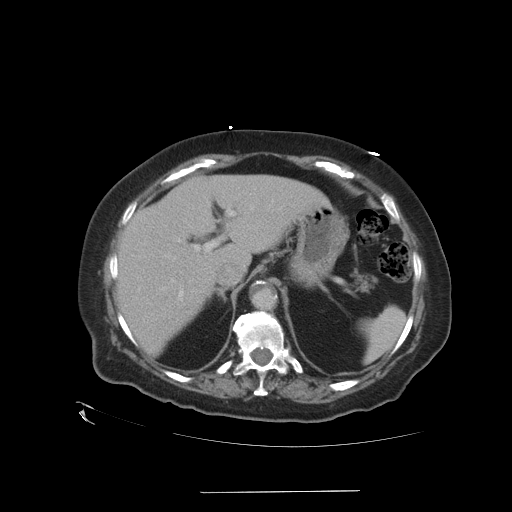
[im 71/86  lung]
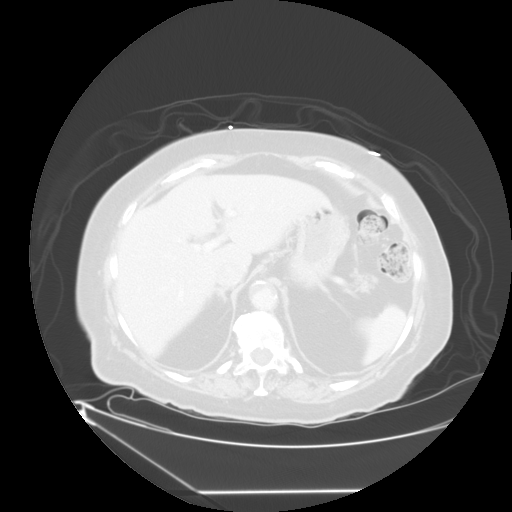

[Series 401: sagittals · sagittal · 0.92mm/px · 8 of 119 slices shown]
[im 12/119  soft-tissue]
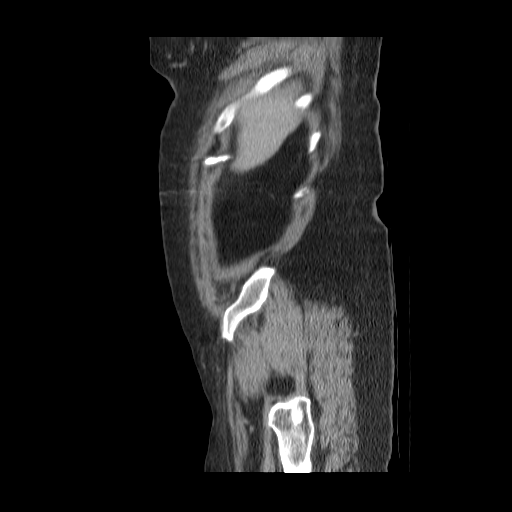
[im 24/119  soft-tissue]
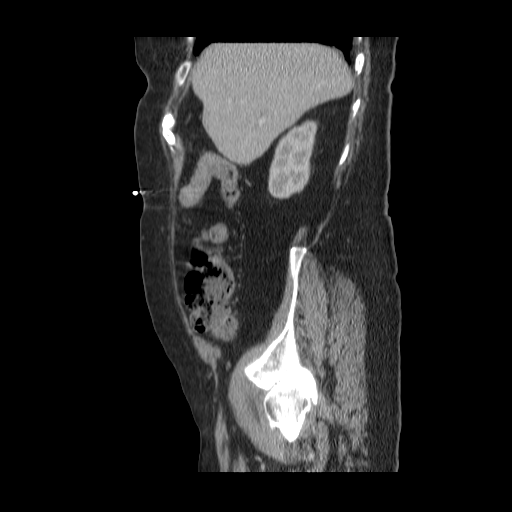
[im 36/119  soft-tissue]
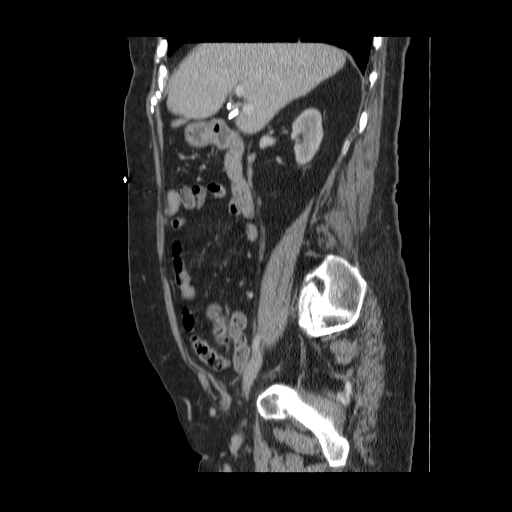
[im 48/119  soft-tissue]
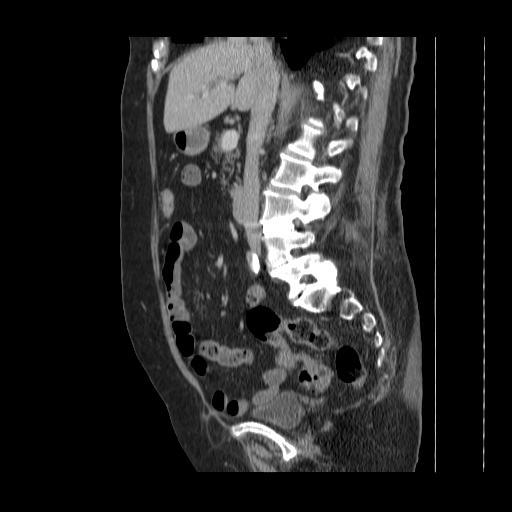
[im 71/119  soft-tissue]
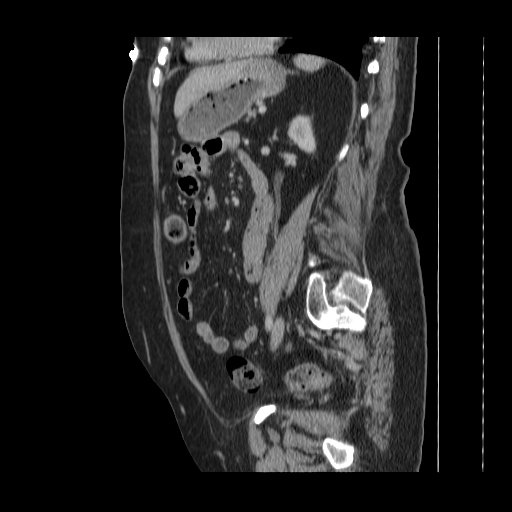
[im 83/119  soft-tissue]
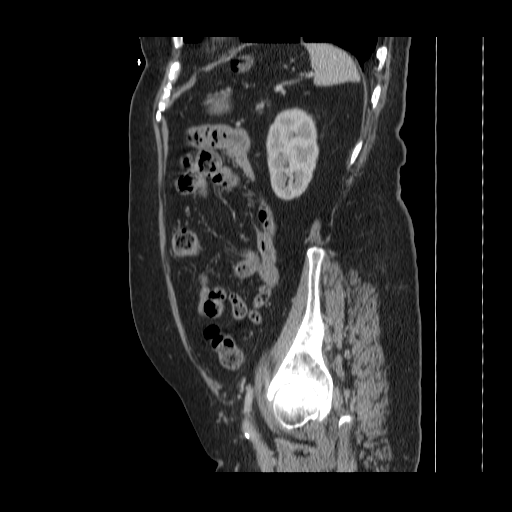
[im 95/119  soft-tissue]
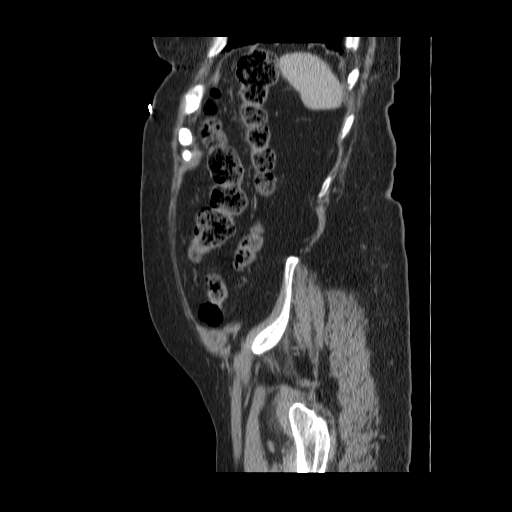
[im 107/119  soft-tissue]
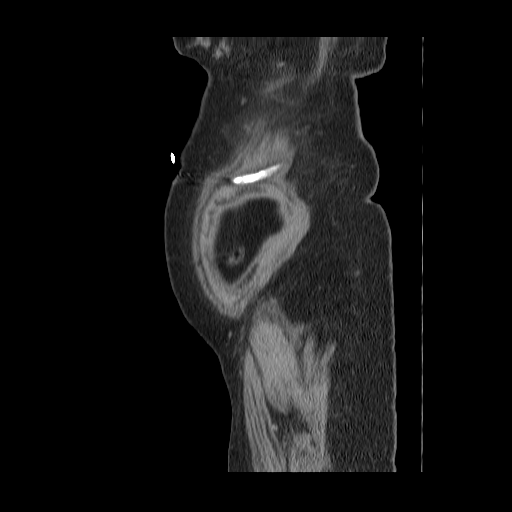

[13 of 32 positions shown; findings below may reference images not displayed]

FINDINGS: Minimal atelectasis left lung base.
Gallbladder surgically absent.
Renal cortical thinning bilaterally.
Liver, spleen, atrophic pancreas, kidneys, and adrenal glands
otherwise normal appearance.

Extensive atherosclerotic calcification of the aorta and iliac
arteries without discrete aneurysm; maximum aortic diameter
measures 2.0 x 2.0 cm infrarenal image 30.
Uterus surgically absent with nonvisualization of the ovaries.
Appendix not identified.
Unremarkable bladder and ureters.
Diverticulosis of the sigmoid colon without evidence of
diverticulitis.

Proximal transverse colon is collapsed and wall thickness is
suboptimally assessed.
Tiny hiatal hernia.
Stomach and small bowel loops otherwise unremarkable.
No mass, adenopathy, free fluid, or inflammatory process.
Bones diffusely demineralized with prior Ray cage fusions at L3-L4
and L4-L5.
IMPRESSION: Minimal sigmoid colonic diverticulosis.
Extensive atherosclerotic disease.
Tiny hiatal hernia.
No definite acute intra abdominal or intrapelvic abnormalities.

## 2013-11-15 ENCOUNTER — Ambulatory Visit: Payer: BLUE CROSS/BLUE SHIELD | Admitting: Cardiovascular Disease

## 2013-11-28 ENCOUNTER — Encounter: Payer: Self-pay | Admitting: Cardiovascular Disease

## 2013-11-28 ENCOUNTER — Ambulatory Visit (INDEPENDENT_AMBULATORY_CARE_PROVIDER_SITE_OTHER): Payer: Medicare Other | Admitting: Cardiovascular Disease

## 2013-11-28 VITALS — BP 106/64 | HR 74 | Ht 63.0 in | Wt 186.8 lb

## 2013-11-28 DIAGNOSIS — E785 Hyperlipidemia, unspecified: Secondary | ICD-10-CM

## 2013-11-28 DIAGNOSIS — I10 Essential (primary) hypertension: Secondary | ICD-10-CM

## 2013-11-28 DIAGNOSIS — I350 Nonrheumatic aortic (valve) stenosis: Secondary | ICD-10-CM

## 2013-11-28 DIAGNOSIS — I359 Nonrheumatic aortic valve disorder, unspecified: Secondary | ICD-10-CM

## 2013-11-28 NOTE — Assessment & Plan Note (Signed)
Her BP is well controlled. Continue same meds/

## 2013-11-28 NOTE — Assessment & Plan Note (Signed)
She has been intolerant to statins

## 2013-11-28 NOTE — Progress Notes (Signed)
Amy Bolton Date of Birth  01/30/1928 [facility]     [place] [facility]  [address]    [address]   [address] [place], [place]  [id number]    [place], [place]  [id number] [phone]  Fax  [phone]  [phone]  Fax [phone]  Problem list: 1. Aortic stenosis - Echo May 2013 - moderate AS , mean gradient of 26 2. Diastolic dysfunction 3. Renal insufficiency 4. Hyperlipidemia-intolerant to statin therapy 5. Minimal coronary artery disease by cardiac cath 2007 6 Hypertension 7. Chronic back pain 8.Peripheral vascular disease - s/p stenting ([facility] )  History of Present Illness:  Amy Bolton is a 80 y.o. female with hx of diastolic dysfunction, mild aortic stenosis, and mild CAD.  She walks regularly.    She fell and injured her leg several months ago. She is scheduled have an an MRI of her leg and back later today.  Dec. 18, 2013 She has had a rough year.  She fell this past spring and injured her back.  She has had multiple falling episodes.  She was admitted with CHF which resulted in a SNF stay.   She had stenting of her iliac arteries.  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  November 02, 2012:  Amy Bolton is doing well.  No CP , no dyspnea.  Her rectocele has improved since she stopped her Welchol.  Her constipation is better.    She does not exercise much.  She is having problems with her right hip and is not able to walk much.  Jan. 12, 2015:  Amy Bolton fell and injured her right hip .  She had a hip replacement  She  is feeling well.    She has some chronic leg edema.  She has vein surgery in both legs.    November 28, 2013:  Amy Bolton is doing well.  She has some diarrhea - will be seeing Dr. Timothy Lassousso next week.   Current Outpatient Prescriptions on File Prior to Visit  Medication Sig Dispense Refill  . ALPRAZolam (XANAX) 0.25 MG tablet Take 0.25 mg by mouth at bedtime. For sleep      . carvedilol (COREG) 25 MG tablet Take 1 tablet  (25 mg total) by mouth 2 (two) times daily with a meal.   Hold SBP < 105  60 tablet  3  . cholecalciferol (VITAMIN D) 1000 UNITS tablet Take 2,000 Units by mouth daily.       . fluticasone (FLONASE) 50 MCG/ACT nasal spray Place 2 sprays into the nose at bedtime.      . furosemide (LASIX) 20 MG tablet Take 40 mg by mouth as needed.       Marland Kitchen. lisinopril (PRINIVIL,ZESTRIL) 20 MG tablet Take 20 mg by mouth daily.      . mirtazapine (REMERON) 30 MG tablet Take 30 mg by mouth at bedtime.      . Omega-3 Fatty Acids (FISH OIL) 1000 MG CAPS Take 1 capsule by mouth daily.      Marland Kitchen. oxyCODONE-acetaminophen (PERCOCET/ROXICET) 5-325 MG per tablet Take 1-2 tablets by mouth every 6 (six) hours as needed for moderate pain.  50 tablet  0  . pantoprazole (PROTONIX) 40 MG tablet Take 40 mg by mouth as needed.        No current facility-administered medications on file prior to visit.    Allergies  Allergen Reactions  . Ampicillin Other (See Comments)    Caused  colitis  . Penicillins Other (See Comments)    Caused colitis  . Sulfa Antibiotics Diarrhea and Other (See Comments)    "muscles drew up"    Past Medical History  Diagnosis Date  . Atypical chest pain   . Palpitation   . SOB (shortness of breath)     Episodic  . Hypertensive urgency     Episodic  . HTN (hypertension)   . Asthma     Underlying  . Kidney disease     Chronic, stage 3, with creatinine clearance estimated to be about 40  . Anxiety     Chronic  . Constipation   . GERD (gastroesophageal reflux disease)   . Hiatal hernia     Hx of  . Hyperlipidemia   . Osteoarthritis   . CHF (congestive heart failure)   . PVD (peripheral vascular disease)   . AAA (abdominal aortic aneurysm)   . Syncope and collapse     Past Surgical History  Procedure Laterality Date  . US echocardiography  10-12-08    2-D done at [facility] on October 12, 2008 showed some diastolic dysfunction, EF 55%, mild tricuspid regurgitation, right  ventricular systolic pressure elevated a 40-50 mmHg, mild valvular aortic stenosis noted.  . Cardiovascular stress test  04-19-2008    showing EF 69% with otherwise a normal stress nuclear study  . Tonsillectomy    . Adenoidectomy    . Spine surgery      2001, lumbar at the L4-5 level; in 2004, C-spine operation with fusion at the  C5-6 level; in 2004, left thumb CMC joint operation; in 2005 bilateral rotator cuff surgeries  . Bladder suspension      surgery  . Cardiac catheterization  01-2006    showing minimal proximal left anterior descending artery disease. showing less than 50% stenosis of the proximal LAD and otherwise normal coronary arteries,  . Back surgery      x2  . Iliac artery stent  03/07/2012  . Tonsillectomy    . Appendectomy    . Cholecystectomy    . Eye surgery Bilateral     cataracts  . Abdominal hysterectomy    . Colonoscopy w/ biopsies and polypectomy    . Total hip arthroplasty Right 03/22/2013    Dr Luiz Blare  . Total hip arthroplasty Right 03/22/2013    Procedure: TOTAL HIP ARTHROPLASTY ANTERIOR APPROACH;  Surgeon: Harvie Junior, MD;  Location: [facility] OR;  Service: Orthopedics;  Laterality: Right;  . Total hip arthroplasty Right     History  Smoking status  . Never Smoker   Smokeless tobacco  . Never Used    Comment: Quit 89yrs    History  Alcohol Use No    No family history on file.  Reviw of Systems:  Reviewed in the HPI.  All other systems are negative.  Physical Exam: Blood pressure 106/64, pulse 74, height 5\' 3"  (1.6 m), weight 186 lb 12.8 oz (84.732 kg). General: Well developed, well nourished, in no acute distress.  Head: Normocephalic, atraumatic, sclera non-icteric, mucus membranes are moist,   Neck: Supple. Carotids are 2 + with radiation of her systolic murmur into her carotid arteries.  No JVD  Lungs: Clear bilaterally to auscultation.  Heart: regular rate.  normal  S1 S2. There is a 1-2/6 systolic murmur at the left sternal  border.  Abdomen: Soft, non-tender, non-distended with normal bowel sounds. No hepatomegaly. No rebound/guarding. No masses.  Msk:  Strength and tone are normal  Extremities: No clubbing or cyanosis. No edema.  Distal pedal pulses are 2+ and equal bilaterally.  Neuro: Alert and oriented X 3. Moves all extremities spontaneously.  Psych:  Responds to questions appropriately with a normal affect.  ECG: Jan. 12, 2015:  NSR with occasioal PVCs NS T wave abnormality. Assessment / Plan:

## 2013-11-28 NOTE — Patient Instructions (Addendum)
  [facility] Low Glycemic Diet (Source: [facility], 2006) Low Glycemic Foods (20-49) (Decrease risk of developing heart disease) Breakfast Cereals: All-Bran All-Bran Fruit 'n Oats Fiber One Oatmeal (not instant) Oat bran Fruits and fruit juices: (Limit to 1-2 servings per day) Apples Apricots (fresh & dried) Blackberries Blueberries Cherries Cranberries Peaches Pears Plums Prunes Grapefruit Raspberries Strawberries Tangerine Apple juice Grapefruit juice Tomato juice Beans and legumes (fresh-cooked): Black-eyed peas Butter beans Chick peas Lentils  Green beans Lima beans Kidney beans Navy beans Pinto beans Snow peas Non-starchy vegetables: Asparagus, avocado, broccoli, cabbage, cauliflower, celery, cucumber, greens, lettuce, mushrooms, peppers, tomatoes, okra, onions, spinach, summer squash Grains: Barley Bulgur Rye Wild rice Nuts and oils : Almonds Peanuts Sunflower seeds Hazelnuts Pecans Walnuts Oils that are liquid at room temperature Dairy, fish, meat, soy, and eggs: Milk, skim Lowfat cheese Yogurt, lowfat, fruit sugar sweetened Lean red meat Fish  Skinless chicken & Malaw[place]urkey Shellfish Egg whites (up to 3 daily) Soy products  Egg yolks (up to 7 or _____ per week) Moderate Glycemic Foods (50-69) Breakfast Cereals: Bran Buds Bran Chex Just Right Mini-Wheats  Special K Swiss muesli Fruits: Banana (under-ripe) Dates Figs Grapes Kiwi Mango Oranges Raisins Fruit Juices: Cranberry juice Orange juice Beans and legumes: Boston-type baked beans Canned pinto, kidney, or navy beans Green peas Vegetables: Beets Carrots  Sweet potato Yam Corn on the cob Breads: Pita (pocket) bread Oat bran bread Pumpernickel bread Rye bread Wheat bread, high fiber  Grains: Cornmeal Rice, brown Rice, white Couscous Pasta: Macaroni Pizza, cheese Ravioli, meat filled Spaghetti, white  Nuts: Cashews Macadamia Snacks: Chocolate Ice cream, lowfat Muffin  Popcorn High Glycemic Foods (70-100)  Breakfast Cereals: Cheerios Corn Chex Corn Flakes Cream of Wheat Grape Nuts Grape Nut Flakes Grits Nutri-Grain Puffed Rice Puffed Wheat Rice Chex Rice Krispies Shredded Wheat Team Total Fruits: Pineapple Watermelon Banana (over-ripe) Beverages: Sodas, sweet tea, pineapple juice Vegetables: Potato, baked, boiled, fried, mashed JamaicaFrench fries Canned or frozen corn Parsnips Winter squash Breads: Most breads (white and whole grain) Bagels Bread sticks Bread stuffing Kaiser roll Dinner rolls Grains: Rice, instant Tapioca, with milk Candy and most cookies Snacks: Donuts Corn chips Jelly beans Pretzels Pastries    Your physician wants you to follow-up in: 1 year. You will receive a reminder letter in the mail two months in advance. If you don't receive a letter, please call our office to schedule the follow-up appointment.

## 2013-11-28 NOTE — Assessment & Plan Note (Signed)
She has moderate AS.  No symptoms. Continue same meds.

## 2014-08-28 NOTE — Op Note (Signed)
PATIENT NAME:  Amy Bolton, Amy Bolton MR#:  [id number] DATE OF BIRTH:  08-08-1927  DATE OF PROCEDURE:  03/07/2012  PREOPERATIVE DIAGNOSES:  1. Peripheral arterial disease with claudication of bilateral lower extremities, left worse than right.  2. History of spinal disease and back surgery.  3. Hyperlipidemia.  4. Hypertension.   POSTOPERATIVE DIAGNOSES:  1. Peripheral arterial disease with claudication of bilateral lower extremities, left worse than right.  2. History of spinal disease and back surgery.  3. Hyperlipidemia.  4. Hypertension.   PROCEDURES PERFORMED: 1. Ultrasound guidance of bilateral femoral arteries for vascular access.  2. Catheter placement into aorta from bilateral femoral approaches.  3. Kissing balloon stent placement to distal aorta and bilateral common iliac arteries with 8 mm diameter kissing balloon stents on each side.  4. StarClose closure device, bilateral femoral arteries.   SURGEON: Festus BarrenJason Aspynn Clover, M.D.   ANESTHESIA: Local with moderate conscious sedation.   ESTIMATED BLOOD LOSS: Approximately 25 mL.   INDICATION FOR PROCEDURE: This is an 81 year old white female who has had weakness and pain in her legs worsening with activity causing her to fall and not be stable on her feet. She is limited in terms of the discomfort in her legs; the left is worse than the right.   DESCRIPTION OF PROCEDURE: The patient was brought to the vascular interventional radiology suite, groins were shaved and prepped, and a sterile surgical field was created. Initially, the right femoral artery was visualized with ultrasound and found to be patent. It was accessed under direct ultrasound guidance without difficulty with a Seldinger needle. A Bolton-wire and 5 French sheath was placed. Pigtail catheter was placed and aortogram and pelvic obliques were then performed. This demonstrated what appeared to be a pretty high grade stenosis at the origin of the left common iliac artery in the 80% to 90%  range. There was also a moderate stenosis in the left common iliac artery just above the iliac bifurcation; this was in the 60 to 70% range. The right common iliac artery had an approximately 50 to 60% stenosis and there was disease in the last centimeter or so of the aorta. I then elected to treat this with kissing balloon stents. The left femoral artery was accessed under direct ultrasound guidance without difficulty and a 6 French sheath was placed and then we up-sized to a 6 [place] sheath on the right and the patient was systemically heparinized. I was able to cross both lesions with a Magic torque wire without difficulty. With the longer lesion length on the left, an 8 mm diameter x 59 mm length balloon expandable stent was selected for the left and an 8 mm diameter x 29 mm length balloon expandable stent was selected for the right. These were inflated simultaneously from the distal aorta into both iliacs, in a kissing balloon fashion. We went to 12 atmospheres, wastes were taken. Balloon angioplasty was performed of the distal portion of the left iliac stent to iron this out a bit more taken to burst inflation with the 8 mm balloon. Completion angiogram performed through both femoral sheaths now showed the aorta and iliac segments to be patent without high-grade residual stenosis. There was some mild residual stenosis of about 20% on the left common iliac system. Both sheaths were removed and StarClose closure device performed in the usual fashion with excellent hemostatic result. The patient tolerated the procedure well and was taken to the recovery room in stable condition.  ____________________________ Annice NeedyJason S. Evona Westra, MD jsd:slb D:  03/07/2012 10:29:02 ET T: 03/07/2012 10:36:04 ET JOB#: [id number]  cc: Annice Needy, MD, <Dictator> Creola Corn, MD Annice Needy MD ELECTRONICALLY SIGNED 03/09/2012 11:36

## 2014-09-07 ENCOUNTER — Emergency Department (HOSPITAL_COMMUNITY): Payer: Medicare Other

## 2014-09-07 ENCOUNTER — Encounter (HOSPITAL_COMMUNITY): Payer: Self-pay

## 2014-09-07 ENCOUNTER — Inpatient Hospital Stay (HOSPITAL_COMMUNITY)
Admission: EM | Admit: 2014-09-07 | Discharge: 2014-10-10 | DRG: 190 | Disposition: E | Payer: Medicare Other | Attending: Internal Medicine | Admitting: Internal Medicine

## 2014-09-07 DIAGNOSIS — Z881 Allergy status to other antibiotic agents status: Secondary | ICD-10-CM | POA: Diagnosis not present

## 2014-09-07 DIAGNOSIS — M109 Gout, unspecified: Secondary | ICD-10-CM | POA: Diagnosis present

## 2014-09-07 DIAGNOSIS — F22 Delusional disorders: Secondary | ICD-10-CM

## 2014-09-07 DIAGNOSIS — Z880 Allergy status to penicillin: Secondary | ICD-10-CM | POA: Diagnosis not present

## 2014-09-07 DIAGNOSIS — Z79891 Long term (current) use of opiate analgesic: Secondary | ICD-10-CM

## 2014-09-07 DIAGNOSIS — I5032 Chronic diastolic (congestive) heart failure: Secondary | ICD-10-CM | POA: Diagnosis present

## 2014-09-07 DIAGNOSIS — N189 Chronic kidney disease, unspecified: Secondary | ICD-10-CM | POA: Diagnosis present

## 2014-09-07 DIAGNOSIS — Z6836 Body mass index (BMI) 36.0-36.9, adult: Secondary | ICD-10-CM | POA: Diagnosis not present

## 2014-09-07 DIAGNOSIS — I739 Peripheral vascular disease, unspecified: Secondary | ICD-10-CM | POA: Diagnosis present

## 2014-09-07 DIAGNOSIS — R34 Anuria and oliguria: Secondary | ICD-10-CM | POA: Diagnosis not present

## 2014-09-07 DIAGNOSIS — R109 Unspecified abdominal pain: Secondary | ICD-10-CM | POA: Diagnosis present

## 2014-09-07 DIAGNOSIS — N183 Chronic kidney disease, stage 3 unspecified: Secondary | ICD-10-CM | POA: Diagnosis present

## 2014-09-07 DIAGNOSIS — T45515A Adverse effect of anticoagulants, initial encounter: Secondary | ICD-10-CM | POA: Diagnosis not present

## 2014-09-07 DIAGNOSIS — G9341 Metabolic encephalopathy: Secondary | ICD-10-CM | POA: Diagnosis not present

## 2014-09-07 DIAGNOSIS — R579 Shock, unspecified: Secondary | ICD-10-CM | POA: Diagnosis not present

## 2014-09-07 DIAGNOSIS — F419 Anxiety disorder, unspecified: Secondary | ICD-10-CM | POA: Diagnosis present

## 2014-09-07 DIAGNOSIS — I10 Essential (primary) hypertension: Secondary | ICD-10-CM | POA: Diagnosis present

## 2014-09-07 DIAGNOSIS — I519 Heart disease, unspecified: Secondary | ICD-10-CM

## 2014-09-07 DIAGNOSIS — R0902 Hypoxemia: Secondary | ICD-10-CM | POA: Diagnosis present

## 2014-09-07 DIAGNOSIS — E875 Hyperkalemia: Secondary | ICD-10-CM | POA: Diagnosis present

## 2014-09-07 DIAGNOSIS — Z981 Arthrodesis status: Secondary | ICD-10-CM

## 2014-09-07 DIAGNOSIS — E785 Hyperlipidemia, unspecified: Secondary | ICD-10-CM | POA: Diagnosis present

## 2014-09-07 DIAGNOSIS — Z7952 Long term (current) use of systemic steroids: Secondary | ICD-10-CM

## 2014-09-07 DIAGNOSIS — R1031 Right lower quadrant pain: Secondary | ICD-10-CM | POA: Diagnosis not present

## 2014-09-07 DIAGNOSIS — R7989 Other specified abnormal findings of blood chemistry: Secondary | ICD-10-CM | POA: Diagnosis present

## 2014-09-07 DIAGNOSIS — Z66 Do not resuscitate: Secondary | ICD-10-CM | POA: Diagnosis not present

## 2014-09-07 DIAGNOSIS — Z96641 Presence of right artificial hip joint: Secondary | ICD-10-CM | POA: Diagnosis present

## 2014-09-07 DIAGNOSIS — D62 Acute posthemorrhagic anemia: Secondary | ICD-10-CM | POA: Diagnosis not present

## 2014-09-07 DIAGNOSIS — J45909 Unspecified asthma, uncomplicated: Secondary | ICD-10-CM | POA: Diagnosis present

## 2014-09-07 DIAGNOSIS — S301XXA Contusion of abdominal wall, initial encounter: Secondary | ICD-10-CM | POA: Diagnosis present

## 2014-09-07 DIAGNOSIS — R680 Hypothermia, not associated with low environmental temperature: Secondary | ICD-10-CM | POA: Diagnosis not present

## 2014-09-07 DIAGNOSIS — E0590 Thyrotoxicosis, unspecified without thyrotoxic crisis or storm: Secondary | ICD-10-CM | POA: Diagnosis present

## 2014-09-07 DIAGNOSIS — Z79899 Other long term (current) drug therapy: Secondary | ICD-10-CM | POA: Diagnosis not present

## 2014-09-07 DIAGNOSIS — K219 Gastro-esophageal reflux disease without esophagitis: Secondary | ICD-10-CM | POA: Diagnosis present

## 2014-09-07 DIAGNOSIS — I129 Hypertensive chronic kidney disease with stage 1 through stage 4 chronic kidney disease, or unspecified chronic kidney disease: Secondary | ICD-10-CM | POA: Diagnosis present

## 2014-09-07 DIAGNOSIS — E872 Acidosis: Secondary | ICD-10-CM | POA: Diagnosis present

## 2014-09-07 DIAGNOSIS — I4891 Unspecified atrial fibrillation: Secondary | ICD-10-CM | POA: Diagnosis present

## 2014-09-07 DIAGNOSIS — R578 Other shock: Secondary | ICD-10-CM | POA: Diagnosis not present

## 2014-09-07 DIAGNOSIS — J441 Chronic obstructive pulmonary disease with (acute) exacerbation: Secondary | ICD-10-CM | POA: Diagnosis present

## 2014-09-07 DIAGNOSIS — Z515 Encounter for palliative care: Secondary | ICD-10-CM

## 2014-09-07 DIAGNOSIS — I5033 Acute on chronic diastolic (congestive) heart failure: Secondary | ICD-10-CM | POA: Diagnosis not present

## 2014-09-07 DIAGNOSIS — Z7901 Long term (current) use of anticoagulants: Secondary | ICD-10-CM | POA: Diagnosis not present

## 2014-09-07 DIAGNOSIS — I714 Abdominal aortic aneurysm, without rupture: Secondary | ICD-10-CM | POA: Diagnosis present

## 2014-09-07 DIAGNOSIS — R946 Abnormal results of thyroid function studies: Secondary | ICD-10-CM | POA: Diagnosis not present

## 2014-09-07 DIAGNOSIS — I480 Paroxysmal atrial fibrillation: Secondary | ICD-10-CM | POA: Diagnosis present

## 2014-09-07 DIAGNOSIS — K449 Diaphragmatic hernia without obstruction or gangrene: Secondary | ICD-10-CM | POA: Diagnosis present

## 2014-09-07 DIAGNOSIS — D6832 Hemorrhagic disorder due to extrinsic circulating anticoagulants: Secondary | ICD-10-CM | POA: Diagnosis not present

## 2014-09-07 DIAGNOSIS — I350 Nonrheumatic aortic (valve) stenosis: Secondary | ICD-10-CM | POA: Diagnosis present

## 2014-09-07 DIAGNOSIS — F329 Major depressive disorder, single episode, unspecified: Secondary | ICD-10-CM | POA: Diagnosis present

## 2014-09-07 DIAGNOSIS — E2740 Unspecified adrenocortical insufficiency: Secondary | ICD-10-CM | POA: Diagnosis present

## 2014-09-07 DIAGNOSIS — I2510 Atherosclerotic heart disease of native coronary artery without angina pectoris: Secondary | ICD-10-CM | POA: Diagnosis present

## 2014-09-07 DIAGNOSIS — I959 Hypotension, unspecified: Secondary | ICD-10-CM | POA: Diagnosis not present

## 2014-09-07 DIAGNOSIS — N179 Acute kidney failure, unspecified: Secondary | ICD-10-CM | POA: Diagnosis not present

## 2014-09-07 DIAGNOSIS — E669 Obesity, unspecified: Secondary | ICD-10-CM | POA: Diagnosis present

## 2014-09-07 DIAGNOSIS — R58 Hemorrhage, not elsewhere classified: Secondary | ICD-10-CM | POA: Diagnosis present

## 2014-09-07 DIAGNOSIS — I5189 Other ill-defined heart diseases: Secondary | ICD-10-CM | POA: Diagnosis present

## 2014-09-07 DIAGNOSIS — E0781 Sick-euthyroid syndrome: Secondary | ICD-10-CM | POA: Diagnosis present

## 2014-09-07 LAB — CBC WITH DIFFERENTIAL/PLATELET
BASOS ABS: 0 10*3/uL (ref 0.0–0.1)
Basophils Relative: 0 % (ref 0–1)
Eosinophils Absolute: 0.2 10*3/uL (ref 0.0–0.7)
Eosinophils Relative: 2 % (ref 0–5)
HEMATOCRIT: 45.8 % (ref 36.0–46.0)
Hemoglobin: 14.9 g/dL (ref 12.0–15.0)
LYMPHS PCT: 11 % — AB (ref 12–46)
Lymphs Abs: 1.3 10*3/uL (ref 0.7–4.0)
MCH: 30 pg (ref 26.0–34.0)
MCHC: 32.5 g/dL (ref 30.0–36.0)
MCV: 92.3 fL (ref 78.0–100.0)
MONOS PCT: 7 % (ref 3–12)
Monocytes Absolute: 0.9 10*3/uL (ref 0.1–1.0)
NEUTROS ABS: 9.7 10*3/uL — AB (ref 1.7–7.7)
Neutrophils Relative %: 80 % — ABNORMAL HIGH (ref 43–77)
PLATELETS: 269 10*3/uL (ref 150–400)
RBC: 4.96 MIL/uL (ref 3.87–5.11)
RDW: 14.7 % (ref 11.5–15.5)
WBC: 12.1 10*3/uL — ABNORMAL HIGH (ref 4.0–10.5)

## 2014-09-07 LAB — BRAIN NATRIURETIC PEPTIDE: B NATRIURETIC PEPTIDE 5: 525.1 pg/mL — AB (ref 0.0–100.0)

## 2014-09-07 LAB — URINALYSIS, ROUTINE W REFLEX MICROSCOPIC
BILIRUBIN URINE: NEGATIVE
Glucose, UA: NEGATIVE mg/dL
Hgb urine dipstick: NEGATIVE
KETONES UR: NEGATIVE mg/dL
LEUKOCYTES UA: NEGATIVE
NITRITE: NEGATIVE
PROTEIN: NEGATIVE mg/dL
SPECIFIC GRAVITY, URINE: 1.014 (ref 1.005–1.030)
Urobilinogen, UA: 1 mg/dL (ref 0.0–1.0)
pH: 5.5 (ref 5.0–8.0)

## 2014-09-07 LAB — COMPREHENSIVE METABOLIC PANEL
ALT: 19 U/L (ref 0–35)
AST: 40 U/L — AB (ref 0–37)
Albumin: 3.3 g/dL — ABNORMAL LOW (ref 3.5–5.2)
Alkaline Phosphatase: 73 U/L (ref 39–117)
Anion gap: 11 (ref 5–15)
BUN: 32 mg/dL — ABNORMAL HIGH (ref 6–23)
CO2: 25 mmol/L (ref 19–32)
Calcium: 9 mg/dL (ref 8.4–10.5)
Chloride: 101 mmol/L (ref 96–112)
Creatinine, Ser: 1.44 mg/dL — ABNORMAL HIGH (ref 0.50–1.10)
GFR calc Af Amer: 37 mL/min — ABNORMAL LOW (ref >90)
GFR calc non Af Amer: 32 mL/min — ABNORMAL LOW (ref >90)
Glucose, Bld: 102 mg/dL — ABNORMAL HIGH (ref 70–99)
Potassium: 5.5 mmol/L — ABNORMAL HIGH (ref 3.5–5.1)
SODIUM: 137 mmol/L (ref 135–145)
Total Bilirubin: 1.4 mg/dL — ABNORMAL HIGH (ref 0.3–1.2)
Total Protein: 5.9 g/dL — ABNORMAL LOW (ref 6.0–8.3)

## 2014-09-07 LAB — MRSA PCR SCREENING: MRSA by PCR: NEGATIVE

## 2014-09-07 LAB — BLOOD GAS, ARTERIAL
Acid-Base Excess: 0.7 mmol/L (ref 0.0–2.0)
BICARBONATE: 24.7 meq/L — AB (ref 20.0–24.0)
Drawn by: 313941
FIO2: 0.35 %
O2 SAT: 96.9 %
Patient temperature: 98.6
TCO2: 25.9 mmol/L (ref 0–100)
pCO2 arterial: 38.9 mmHg (ref 35.0–45.0)
pH, Arterial: 7.419 (ref 7.350–7.450)
pO2, Arterial: 98.5 mmHg (ref 80.0–100.0)

## 2014-09-07 LAB — TROPONIN I: Troponin I: 0.05 ng/mL — ABNORMAL HIGH (ref <0.031)

## 2014-09-07 LAB — I-STAT CG4 LACTIC ACID, ED: LACTIC ACID, VENOUS: 1.67 mmol/L (ref 0.5–2.0)

## 2014-09-07 MED ORDER — ACETAMINOPHEN 650 MG RE SUPP: 650.0000 mg | Freq: Four times a day (QID) | RECTAL | Status: DC | PRN

## 2014-09-07 MED ORDER — DEXTROSE 5 % IV SOLN
100.0000 mg | Freq: Once | INTRAVENOUS | Status: AC
  Administered 2014-09-07: 100 mg via INTRAVENOUS
  Filled 2014-09-07: qty 100

## 2014-09-07 MED ORDER — MORPHINE SULFATE 2 MG/ML IJ SOLN
2.0000 mg | INTRAMUSCULAR | Status: DC | PRN
  Administered 2014-09-11 – 2014-09-13 (×4): 2 mg via INTRAVENOUS
  Filled 2014-09-07 (×4): qty 1

## 2014-09-07 MED ORDER — HEPARIN SODIUM (PORCINE) 5000 UNIT/ML IJ SOLN
5000.0000 [IU] | Freq: Three times a day (TID) | INTRAMUSCULAR | Status: DC
  Administered 2014-09-07 – 2014-09-08 (×3): 5000 [IU] via SUBCUTANEOUS
  Filled 2014-09-07 (×5): qty 1

## 2014-09-07 MED ORDER — FLUTICASONE PROPIONATE 50 MCG/ACT NA SUSP
2.0000 | Freq: Every day | NASAL | Status: DC
  Administered 2014-09-07 – 2014-09-13 (×5): 2 via NASAL
  Filled 2014-09-07 (×3): qty 16

## 2014-09-07 MED ORDER — ALPRAZOLAM 0.25 MG PO TABS
0.2500 mg | ORAL_TABLET | Freq: Every day | ORAL | Status: DC
  Administered 2014-09-07 – 2014-09-12 (×6): 0.25 mg via ORAL
  Filled 2014-09-07 (×6): qty 1

## 2014-09-07 MED ORDER — LEVALBUTEROL HCL 0.63 MG/3ML IN NEBU
0.6300 mg | INHALATION_SOLUTION | Freq: Four times a day (QID) | RESPIRATORY_TRACT | Status: DC
  Administered 2014-09-07 – 2014-09-08 (×5): 0.63 mg via RESPIRATORY_TRACT
  Filled 2014-09-07 (×9): qty 3

## 2014-09-07 MED ORDER — METHYLPREDNISOLONE SODIUM SUCC 125 MG IJ SOLR
60.0000 mg | Freq: Four times a day (QID) | INTRAMUSCULAR | Status: DC
  Administered 2014-09-07 – 2014-09-08 (×4): 60 mg via INTRAVENOUS
  Filled 2014-09-07 (×2): qty 0.96
  Filled 2014-09-07: qty 2
  Filled 2014-09-07 (×2): qty 0.96
  Filled 2014-09-07 (×2): qty 2

## 2014-09-07 MED ORDER — LEVALBUTEROL HCL 0.63 MG/3ML IN NEBU
0.6300 mg | INHALATION_SOLUTION | Freq: Once | RESPIRATORY_TRACT | Status: AC
  Administered 2014-09-07: 0.63 mg via RESPIRATORY_TRACT
  Filled 2014-09-07: qty 3

## 2014-09-07 MED ORDER — VITAMIN D3 25 MCG (1000 UNIT) PO TABS
2000.0000 [IU] | ORAL_TABLET | Freq: Every day | ORAL | Status: DC
  Administered 2014-09-08 – 2014-09-12 (×5): 2000 [IU] via ORAL
  Filled 2014-09-07 (×7): qty 2

## 2014-09-07 MED ORDER — OMEGA-3-ACID ETHYL ESTERS 1 G PO CAPS
1.0000 g | ORAL_CAPSULE | Freq: Every day | ORAL | Status: DC
  Administered 2014-09-08: 1 g via ORAL
  Filled 2014-09-07 (×7): qty 1

## 2014-09-07 MED ORDER — ACETAMINOPHEN 325 MG PO TABS: 650.0000 mg | ORAL_TABLET | Freq: Four times a day (QID) | ORAL | Status: DC | PRN

## 2014-09-07 MED ORDER — MIRTAZAPINE 30 MG PO TABS
30.0000 mg | ORAL_TABLET | Freq: Every day | ORAL | Status: DC
  Administered 2014-09-07 – 2014-09-12 (×6): 30 mg via ORAL
  Filled 2014-09-07 (×9): qty 1

## 2014-09-07 MED ORDER — LEVOFLOXACIN IN D5W 750 MG/150ML IV SOLN
750.0000 mg | INTRAVENOUS | Status: DC
  Administered 2014-09-09 – 2014-09-11 (×2): 750 mg via INTRAVENOUS
  Filled 2014-09-07 (×2): qty 150

## 2014-09-07 MED ORDER — ALLOPURINOL 300 MG PO TABS
300.0000 mg | ORAL_TABLET | Freq: Every day | ORAL | Status: DC
  Administered 2014-09-08 – 2014-09-12 (×5): 300 mg via ORAL
  Filled 2014-09-07 (×7): qty 1

## 2014-09-07 MED ORDER — PANTOPRAZOLE SODIUM 40 MG PO TBEC
40.0000 mg | DELAYED_RELEASE_TABLET | Freq: Every day | ORAL | Status: DC
  Administered 2014-09-07 – 2014-09-09 (×3): 40 mg via ORAL
  Filled 2014-09-07 (×3): qty 1

## 2014-09-07 MED ORDER — CARVEDILOL 25 MG PO TABS
25.0000 mg | ORAL_TABLET | Freq: Two times a day (BID) | ORAL | Status: DC
  Administered 2014-09-07 – 2014-09-13 (×12): 25 mg via ORAL
  Filled 2014-09-07 (×14): qty 1

## 2014-09-07 MED ORDER — LEVALBUTEROL HCL 0.63 MG/3ML IN NEBU
0.6300 mg | INHALATION_SOLUTION | Freq: Once | RESPIRATORY_TRACT | Status: DC
  Filled 2014-09-07: qty 3

## 2014-09-07 MED ORDER — SODIUM CHLORIDE 0.9 % IJ SOLN
3.0000 mL | Freq: Two times a day (BID) | INTRAMUSCULAR | Status: DC
  Administered 2014-09-07 – 2014-09-08 (×2): 3 mL via INTRAVENOUS

## 2014-09-07 MED ORDER — VITAMIN D3 25 MCG (1000 UNIT) PO TABS: 2000.0000 [IU] | ORAL_TABLET | Freq: Every day | ORAL | Status: DC

## 2014-09-07 MED ORDER — ALLOPURINOL 300 MG PO TABS: 300.0000 mg | ORAL_TABLET | Freq: Every day | ORAL | Status: DC

## 2014-09-07 MED ORDER — SODIUM CHLORIDE 0.9 % IV BOLUS (SEPSIS)
500.0000 mL | Freq: Once | INTRAVENOUS | Status: AC
  Administered 2014-09-07: 500 mL via INTRAVENOUS

## 2014-09-07 MED ORDER — FISH OIL 1000 MG PO CAPS: 1.0000 | ORAL_CAPSULE | Freq: Every day | ORAL | Status: DC

## 2014-09-07 NOTE — ED Notes (Signed)
Made pt aware of bed of bed assignment

## 2014-09-07 NOTE — ED Notes (Addendum)
Pt placed on 3L [place] for Sat of 86%. O2 Sat now 92%

## 2014-09-07 NOTE — ED Notes (Signed)
On Sun day pt. Was not feeling well, and began coughing, Monday she was coughing so hard she would vomit.  She went to Dr. Timothy Lassousso on Tuesday and they placed her on Zpak, prednisone and inhaler.  Chest x-ray was clear.  Pt. Is not feeling any better.   Continues to having coughing, wheezing and weakness

## 2014-09-07 NOTE — H&P (Signed)
[facility] History and Physical  Amy Bolton ZOX:[id number] DOB: 02/16/1928 DOA: 08/19/2014  Referring physician: Emergency Department PCP: Gwen PoundsUSSO,[name], MD   Chief Complaint: COPD exacerbation  HPI: Amy Sinclairnnie J Gatton is a 81 y.o. female  With a hx of copd, cad, pad, HTN, hld, CKD who presents to the ED with worsening sob and wheezing. Pt was seen by PCP 4 days prior to admit where pt was given zpak, PO prednisone, and inhaled bronchodilator. Sx did not improve and pt presented to ED where O2 sats were noted to be in the mid-upper 80's on  4LNC. Pt was given neb tx with O2 sats of 90% on venturi mask. Hospitalist consulted for admission   Review of Systems:  Review of Systems  Constitutional: Negative for fever and chills.  HENT: Negative for hearing loss and tinnitus.   Eyes: Negative for double vision and pain.  Respiratory: Positive for cough, shortness of breath and wheezing.   Cardiovascular: Negative for chest pain and orthopnea.  Gastrointestinal: Negative for nausea and abdominal pain.  Genitourinary: Negative for frequency and hematuria.  Musculoskeletal: Negative for joint pain and neck pain.  Skin: Negative for itching and rash.  Neurological: Negative for tremors, seizures and loss of consciousness.  Psychiatric/Behavioral: Negative for suicidal ideas and substance abuse. The patient does not have insomnia.      Past Medical History  Diagnosis Date  . Atypical chest pain   . Palpitation   . SOB (shortness of breath)     Episodic  . Hypertensive urgency     Episodic  . HTN (hypertension)   . Asthma     Underlying  . Kidney disease     Chronic, stage 3, with creatinine clearance estimated to be about 40  . Anxiety     Chronic  . Constipation   . GERD (gastroesophageal reflux disease)   . Hiatal hernia     Hx of  . Hyperlipidemia   . Osteoarthritis   . CHF (congestive heart failure)   . PVD (peripheral vascular disease)   . AAA (abdominal aortic  aneurysm)   . Syncope and collapse    Past Surgical History  Procedure Laterality Date  . [place] echocardiography  10-12-08    [facility] October 12, 2008 showed some diastolic dysfunction, EF 55%, mild tricuspid regurgitation, right ventricular systolic pressure elevated a 40-50 mmHg, mild valvular aortic stenosis noted.  . Cardiovascular stress test  04-19-2008    showing EF 69% with otherwise a normal stress nuclear study  . Tonsillectomy    . Adenoidectomy    . Spine surgery      2001, lumbar at the L4-5 level; in 2004, C-spine operation with fusion at the  C5-6 level; in 2004, left thumb CMC joint operation; in 2005 bilateral rotator cuff surgeries  . Bladder suspension      surgery  . Cardiac catheterization  01-2006    showing minimal proximal left anterior descending artery disease. showing less than 50% stenosis of the proximal LAD and otherwise normal coronary arteries,  . Back surgery      x2  . Iliac artery stent  03/07/2012  . Tonsillectomy    . Appendectomy    . Cholecystectomy    . Eye surgery Bilateral     cataracts  . Abdominal hysterectomy    . Colonoscopy w/ biopsies and polypectomy    . Total hip arthroplasty Right 03/22/2013    Dr Luiz BlareGraves  . Total hip  arthroplasty Right 03/22/2013    Procedure: TOTAL HIP ARTHROPLASTY ANTERIOR APPROACH;  Surgeon: Harvie Junior, MD;  Location: [facility] OR;  Service: Orthopedics;  Laterality: Right;  . Total hip arthroplasty Right    Social History:  reports that she has never smoked. She has never used smokeless tobacco. She reports that she does not drink alcohol or use illicit drugs.  Allergies  Allergen Reactions  . Ampicillin Other (See Comments)    Caused colitis  . Other Itching    Eye cream -doesn't know name  . Penicillins Other (See Comments)    Caused colitis  . Sulfa Antibiotics Diarrhea and Other (See Comments)    "muscles drew up"    No family history on file. Patient reports no known  history  Prior to Admission medications   Medication Sig Start Date End Date Taking? Authorizing Provider  allopurinol (ZYLOPRIM) 300 MG tablet Take 300 mg by mouth daily.  11/02/13  Yes Historical Provider, MD  azithromycin (ZITHROMAX) 250 MG tablet Take 250-500 mg by mouth daily. Take 2 tablets today, then 1 tablet daily for 4 days   Yes Historical Provider, MD  carvedilol (COREG) 25 MG tablet Take 1 tablet (25 mg total) by mouth 2 (two) times daily with a meal.   Hold SBP < 105 12/11/11  Yes Creola Corn, MD  cholecalciferol (VITAMIN D) 1000 UNITS tablet Take 2,000 Units by mouth daily.    Yes Historical Provider, MD  fluticasone (FLONASE) 50 MCG/ACT nasal spray Place 2 sprays into the nose at bedtime.   Yes Historical Provider, MD  furosemide (LASIX) 20 MG tablet Take 40 mg by mouth as needed.  05/05/13  Yes Historical Provider, MD  lisinopril (PRINIVIL,ZESTRIL) 20 MG tablet Take 20 mg by mouth daily.   Yes Historical Provider, MD  mirtazapine (REMERON) 30 MG tablet Take 30 mg by mouth at bedtime.   Yes Historical Provider, MD  pantoprazole (PROTONIX) 40 MG tablet Take 40 mg by mouth as needed.  10/14/12  Yes Historical Provider, MD  predniSONE (DELTASONE) 20 MG tablet Take 40 mg by mouth daily with breakfast. For 5 days   Yes Historical Provider, MD  Umeclidinium-Vilanterol 62.5-25 MCG/INH AEPB Inhale 1 puff into the lungs daily as needed.   Yes Historical Provider, MD  VENTOLIN HFA 108 (90 BASE) MCG/ACT inhaler  10/05/13  Yes Historical Provider, MD  ALPRAZolam (XANAX) 0.25 MG tablet Take 0.25 mg by mouth at bedtime. For sleep 11/13/11   Historical Provider, MD  azithromycin (ZITHROMAX) 250 MG tablet Take 250 mg by mouth daily. zpak    Historical Provider, MD  Omega-3 Fatty Acids (FISH OIL) 1000 MG CAPS Take 1 capsule by mouth daily.    Historical Provider, MD  oxyCODONE-acetaminophen (PERCOCET/ROXICET) 5-325 MG per tablet Take 1-2 tablets by mouth every 6 (six) hours as needed for moderate  pain. Patient not taking: Reported on 08/26/2014 03/24/13   Marshia Ly, PA-C   Physical Exam: Filed Vitals:   08/14/2014 1155 08/21/2014 1200 08/23/2014 1207 09/02/2014 1302  BP: 129/82 135/86    Pulse: 50 93    Temp:   98.9 F (37.2 C)   TempSrc:   Rectal   Resp: 30 27    Height:      Weight:      SpO2: 90% 90%  90%    Wt Readings from Last 3 Encounters:  08/30/2014 82.555 kg (182 lb)  11/28/13 84.732 kg (186 lb 12.8 oz)  05/22/13 84.142 kg (185 lb 8 oz)  General:  Appears calm and comfortable Eyes: PERRL, normal lids, irises & conjunctiva ENT: grossly normal hearing, lips & tongue Neck: no LAD, masses or thyromegaly Cardiovascular: RRR, no m/r/g. No LE edema. Telemetry: SR, no arrhythmias  Respiratory: Increased resp effort, end-expiratory wheezing, decreased BS throughout Abdomen: soft, ntnd Skin: no rash or induration seen on limited exam Musculoskeletal: grossly normal tone BUE/BLE Psychiatric: grossly normal mood and affect, speech fluent and appropriate Neurologic: grossly non-focal.          Labs on Admission:  Basic Metabolic Panel:  Recent Labs Lab 08/27/2014 1157  NA 137  K 5.5*  CL 101  CO2 25  GLUCOSE 102*  BUN 32*  CREATININE 1.44*  CALCIUM 9.0   Liver Function Tests:  Recent Labs Lab 08/22/2014 1157  AST 40*  ALT 19  ALKPHOS 73  BILITOT 1.4*  PROT 5.9*  ALBUMIN 3.3*   No results for input(s): LIPASE, AMYLASE in the last 168 hours. No results for input(s): AMMONIA in the last 168 hours. CBC:  Recent Labs Lab 08/12/2014 1157  WBC 12.1*  NEUTROABS 9.7*  HGB 14.9  HCT 45.8  MCV 92.3  PLT 269   Cardiac Enzymes:  Recent Labs Lab 08/28/2014 1157  TROPONINI 0.05*    BNP (last 3 results)  Recent Labs  08/30/2014 1157  BNP 525.1*    ProBNP (last 3 results) No results for input(s): PROBNP in the last 8760 hours.  CBG: No results for input(s): GLUCAP in the last 168 hours.  Radiological Exams on Admission: Dg Chest Port 1  View  09/04/2014   CLINICAL DATA:  Productive cough. Shortness of breath. Wheezing. Symptoms started this morning.  EXAM: PORTABLE CHEST - 1 VIEW  COMPARISON:  03/15/2013  FINDINGS: Lower cervical plate and screw fixator. Atherosclerotic calcification of the aortic arch. Heart size within normal limits for projection. Postoperative findings from Mumford procedure on the right.  Borderline central airway thickening. Indistinct pulmonary vasculature is probably technique related. No overt edema.  IMPRESSION: 1. Airway thickening is present, suggesting bronchitis or reactive airways disease.   Electronically Signed   By: Gaylyn Rong M.D.   On: 09/03/2014 13:04    Assessment/Plan Principal Problem:   COPD exacerbation Active Problems:   Diastolic dysfunction   Hyperlipidemia   CAD (coronary artery disease)   Obesity   PVD (peripheral vascular disease)   1. COPD exacerbation 1. Will continue scheduled xopenex nebs 2. Will cont on  IV solumedrol q6hrs 3. Cont O2 as needed and wean as tolerated 4. Will start empiric levaquin 5. Will check ABG 6. Admit to stepdown 2. Hx chronic diastolic CHF 1. Appears euvolemic 2. Monitor daily wt and i/o 3. HLD 1. Chart reviewed 2. Pt noted to be intolerant of statin 4. CAD 1. Stable 2. No cp 5. Obesity 1. stable 6. PVD 1. Seems stable at this time 7. HTN 1. bp stable and controlled currently 2. Cont home meds with exception of ACEI, see below 8. Hyperkalemia 1. Will hold ACEI for now and monitor 2. Anticipate potassium levels to trend down with concurrent xopenex use 9. DVT prophylaxis 1. Heparin subQ   Code Status: Full (must indicate code status--if unknown or must be presumed, indicate so) DVT Prophylaxis: Family Communication: Pt in room (indicate person spoken with, if applicable, with phone number if by telephone) Disposition Plan: Admit to stepdown (indicate anticipated LOS)   CHIU, STEPHEN K [facility] Pager  [phone]

## 2014-09-07 NOTE — Progress Notes (Signed)
RN called RT to report pt is mouth breather and spo2 low. Pt placed on 4L [place] Mask at this time. Spo2 90%. Pt reports hx of COPD. RN aware that spo2 88-92% ok.  RT will continue to monitor.

## 2014-09-07 NOTE — ED Notes (Signed)
Admitting at bedside 

## 2014-09-07 NOTE — Progress Notes (Signed)
Report called to RT on admitting unit. Pt charged for oxygen and HHN tx's. Pharmacy sent an extra xopenex tx to ED. RN aware to send it to floor with pt.

## 2014-09-07 NOTE — Progress Notes (Signed)
ANTIBIOTIC CONSULT NOTE - INITIAL  Pharmacy Consult for Levaquin Indication: rule out pneumonia  Allergies  Allergen Reactions  . Ampicillin Other (See Comments)    Caused colitis  . Other Itching    Eye cream -doesn't know name  . Penicillins Other (See Comments)    Caused colitis  . Sulfa Antibiotics Diarrhea and Other (See Comments)    "muscles drew up"    Patient Measurements: Height: 5\' 3"  (160 cm) Weight: 182 lb (82.555 kg) IBW/kg (Calculated) : 52.4 Adjusted Body Weight:   Vital Signs: Temp: 98.9 F (37.2 C) (04/29 1207) Temp Source: Rectal (04/29 1207) BP: 135/86 mmHg (04/29 1200) Pulse Rate: 93 (04/29 1200) Intake/Output from previous day:   Intake/Output from this shift:    Labs:  Recent Labs  08/19/2014 1157  WBC 12.1*  HGB 14.9  PLT 269  CREATININE 1.44*   Estimated Creatinine Clearance: 29.1 mL/min (by C-G formula based on Cr of 1.44). No results for input(s): VANCOTROUGH, VANCOPEAK, VANCORANDOM, GENTTROUGH, GENTPEAK, GENTRANDOM, TOBRATROUGH, TOBRAPEAK, TOBRARND, AMIKACINPEAK, AMIKACINTROU, AMIKACIN in the last 72 hours.   Microbiology: No results found for this or any previous visit (from the past 720 hour(s)).  Medical History: Past Medical History  Diagnosis Date  . Atypical chest pain   . Palpitation   . SOB (shortness of breath)     Episodic  . Hypertensive urgency     Episodic  . HTN (hypertension)   . Asthma     Underlying  . Kidney disease     Chronic, stage 3, with creatinine clearance estimated to be about 40  . Anxiety     Chronic  . Constipation   . GERD (gastroesophageal reflux disease)   . Hiatal hernia     Hx of  . Hyperlipidemia   . Osteoarthritis   . CHF (congestive heart failure)   . PVD (peripheral vascular disease)   . AAA (abdominal aortic aneurysm)   . Syncope and collapse     Medications:   (Not in a hospital admission) Scheduled:  . allopurinol  300 mg Oral Daily  . ALPRAZolam  0.25 mg Oral QHS  .  carvedilol  25 mg Oral BID WC  . cholecalciferol  2,000 Units Oral Daily  . Fish Oil  1 capsule Oral Daily  . fluticasone  2 spray Each Nare Daily  . levalbuterol  0.63 mg Nebulization Once  . pantoprazole  40 mg Oral Daily   Infusions:  . doxycycline (VIBRAMYCIN) IV     Assessment: 81yo female with history of COPD, PAD, HTN and CKD presents with worsening SOB and wheezing. Pharmacy is consulted to dose levaquin for suspected CAP. Pt is afebrile, WBC 12.1, sCr 1.44, LA 1.7, QTc 427, and patient has an allergy to penicillins.   Goal of Therapy:  Eradication of infection  Plan:  Levaquin 750mg  IV q48h Expected duration 5 days with resolution of temperature and/or normalization of WBC Follow up culture results, renal function, QTc, and clinical course  Arlean HoppingCorey M. Newman PiesBall, PharmD Clinical Pharmacist Pager [phone] 08/28/2014,3:09 PM

## 2014-09-07 NOTE — ED Notes (Signed)
Pt sats at 87% on venti at 4L, bumped pt up to 5L  And sats up to 90%. Per Marisue IvanLiz with respiratory pt sats are okay at 87%-92%. Advised to keep at 5L O2

## 2014-09-07 NOTE — Progress Notes (Signed)
RN called and stated VM increased to 5L . Pt spo2 90%.

## 2014-09-07 NOTE — Progress Notes (Signed)
Pharmacy notified of 0.63 Xopenex needed. Pt states she cannot take Albuterol as it makes her "eyes water uncontrollably" per PA.

## 2014-09-07 NOTE — ED Notes (Signed)
ED PA at bedside

## 2014-09-07 NOTE — ED Provider Notes (Signed)
CSN: [id number]     Arrival date & time 08/31/2014  1115 History   First MD Initiated Contact with Patient 08/30/2014 1138     Chief Complaint  Patient presents with  . Cough     (Consider location/radiation/quality/duration/timing/severity/associated sxs/prior Treatment) The history is provided by the patient. No language interpreter was used.  Amy Bolton is an 81 year old female with past medical history of hypertension, asthma, kidney disease, anxiety, GERD, COPD, AAA presenting to the ED with not feeling well since 6 days ago. Patient reports that she's been having a cough since 6 days ago that is productive of yellow/greenish phlegm. Reported that 5 days ago she had continuous episodes of emesis after coughing fits. Reported that she's been having intermittent chills and sweats throughout the day, but more so at night. Reported that she was seen and assessed by her primary care provider proximal to 5 days ago and started on azithromycin, prednisone and an inhaler without relief-reported that her symptoms of gotten worse. Stated that she does not use oxygen home, but continues to have shortness of breath especially with exertion and increased with cough. Reports that she is generalized weakness and fatigue. Denied travel, hemoptysis, sick contacts, abdominal pain, nausea, chest pain, numbness, tingling. Denied ever having to be intubated. PCP Dr. Timothy Lasso  Past Medical History  Diagnosis Date  . Atypical chest pain   . Palpitation   . SOB (shortness of breath)     Episodic  . Hypertensive urgency     Episodic  . HTN (hypertension)   . Asthma     Underlying  . Kidney disease     Chronic, stage 3, with creatinine clearance estimated to be about 40  . Anxiety     Chronic  . Constipation   . GERD (gastroesophageal reflux disease)   . Hiatal hernia     Hx of  . Hyperlipidemia   . Osteoarthritis   . CHF (congestive heart failure)   . PVD (peripheral vascular disease)   . AAA  (abdominal aortic aneurysm)   . Syncope and collapse    Past Surgical History  Procedure Laterality Date  . US echocardiography  10-12-08    2-D done at [facility] on October 12, 2008 showed some diastolic dysfunction, EF 55%, mild tricuspid regurgitation, right ventricular systolic pressure elevated a 40-50 mmHg, mild valvular aortic stenosis noted.  . Cardiovascular stress test  04-19-2008    showing EF 69% with otherwise a normal stress nuclear study  . Tonsillectomy    . Adenoidectomy    . Spine surgery      2001, lumbar at the L4-5 level; in 2004, C-spine operation with fusion at the  C5-6 level; in 2004, left thumb CMC joint operation; in 2005 bilateral rotator cuff surgeries  . Bladder suspension      surgery  . Cardiac catheterization  01-2006    showing minimal proximal left anterior descending artery disease. showing less than 50% stenosis of the proximal LAD and otherwise normal coronary arteries,  . Back surgery      x2  . Iliac artery stent  03/07/2012  . Tonsillectomy    . Appendectomy    . Cholecystectomy    . Eye surgery Bilateral     cataracts  . Abdominal hysterectomy    . Colonoscopy w/ biopsies and polypectomy    . Total hip arthroplasty Right 03/22/2013    Dr Luiz Blare  . Total hip arthroplasty Right 03/22/2013    Procedure: TOTAL HIP  ARTHROPLASTY ANTERIOR APPROACH;  Surgeon: Harvie JuniorJohn L Graves, MD;  Location: [facility] OR;  Service: Orthopedics;  Laterality: Right;  . Total hip arthroplasty Right    No family history on file. History  Substance Use Topics  . Smoking status: Never Smoker   . Smokeless tobacco: Never Used     Comment: Quit [id number]  . Alcohol Use: No   OB History    No data available     Review of Systems  Constitutional: Negative for fever and chills.  Respiratory: Positive for cough, chest tightness and shortness of breath.   Cardiovascular: Negative for chest pain.  Gastrointestinal: Positive for nausea and vomiting (posttussive).  Negative for abdominal pain, diarrhea and constipation.  Musculoskeletal: Negative for back pain, neck pain and neck stiffness.  Neurological: Negative for dizziness, weakness and headaches.      Allergies  Ampicillin; Other; Penicillins; and Sulfa antibiotics  Home Medications   Prior to Admission medications   Medication Sig Start Date End Date Taking? Authorizing Provider  allopurinol (ZYLOPRIM) 300 MG tablet Take 300 mg by mouth daily.  11/02/13  Yes Historical Provider, MD  azithromycin (ZITHROMAX) 250 MG tablet Take 250-500 mg by mouth daily. Take 2 tablets today, then 1 tablet daily for 4 days   Yes Historical Provider, MD  carvedilol (COREG) 25 MG tablet Take 1 tablet (25 mg total) by mouth 2 (two) times daily with a meal.   Hold SBP < 105 12/11/11  Yes Creola CornJohn Russo, MD  cholecalciferol (VITAMIN D) 1000 UNITS tablet Take 2,000 Units by mouth daily.    Yes Historical Provider, MD  fluticasone (FLONASE) 50 MCG/ACT nasal spray Place 2 sprays into the nose at bedtime.   Yes Historical Provider, MD  furosemide (LASIX) 20 MG tablet Take 40 mg by mouth as needed.  05/05/13  Yes Historical Provider, MD  lisinopril (PRINIVIL,ZESTRIL) 20 MG tablet Take 20 mg by mouth daily.   Yes Historical Provider, MD  mirtazapine (REMERON) 30 MG tablet Take 30 mg by mouth at bedtime.   Yes Historical Provider, MD  pantoprazole (PROTONIX) 40 MG tablet Take 40 mg by mouth as needed.  10/14/12  Yes Historical Provider, MD  predniSONE (DELTASONE) 20 MG tablet Take 40 mg by mouth daily with breakfast. For 5 days   Yes Historical Provider, MD  Umeclidinium-Vilanterol 62.5-25 MCG/INH AEPB Inhale 1 puff into the lungs daily as needed.   Yes Historical Provider, MD  VENTOLIN HFA 108 (90 BASE) MCG/ACT inhaler  10/05/13  Yes Historical Provider, MD  ALPRAZolam (XANAX) 0.25 MG tablet Take 0.25 mg by mouth at bedtime. For sleep 11/13/11   Historical Provider, MD  azithromycin (ZITHROMAX) 250 MG tablet Take 250 mg by mouth  daily. zpak    Historical Provider, MD  Omega-3 Fatty Acids (FISH OIL) 1000 MG CAPS Take 1 capsule by mouth daily.    Historical Provider, MD  oxyCODONE-acetaminophen (PERCOCET/ROXICET) 5-325 MG per tablet Take 1-2 tablets by mouth every 6 (six) hours as needed for moderate pain. Patient not taking: Reported on 08/30/2014 03/24/13   Marshia LyJames Bethune, PA-C   BP 135/86 mmHg  Pulse 93  Temp(Src) 98.9 F (37.2 C) (Rectal)  Resp 27  Ht 5\' 3"  (1.6 m)  Wt 182 lb (82.555 kg)  BMI 32.25 kg/m2  SpO2 90% Physical Exam  Constitutional: She is oriented to person, place, and time. She appears well-developed and well-nourished. No distress.  HENT:  Head: Normocephalic and atraumatic.  Mouth/Throat: Oropharynx is clear and moist. No oropharyngeal exudate.  Eyes:  Conjunctivae and EOM are normal. Pupils are equal, round, and reactive to light. Right eye exhibits no discharge. Left eye exhibits no discharge.  Neck: Normal range of motion. Neck supple. No tracheal deviation present.  Cardiovascular: Normal rate, regular rhythm and normal heart sounds.  Exam reveals no friction rub.   No murmur heard. Pulmonary/Chest: Effort normal. No respiratory distress. She has wheezes (Expiratory) in the right upper field, the right middle field, the right lower field, the left upper field and the left lower field. She has rales in the right lower field and the left lower field. She exhibits no tenderness.  Musculoskeletal: Normal range of motion.  Full ROM to upper and lower extremities without difficulty noted, negative ataxia noted.  Lymphadenopathy:    She has no cervical adenopathy.  Neurological: She is alert and oriented to person, place, and time. No cranial nerve deficit. She exhibits normal muscle tone. Coordination normal. GCS eye subscore is 4. GCS verbal subscore is 5. GCS motor subscore is 6.  Patient follow commands well Patient responds to questions appropriately Cranial nerves grossly intact Equal grip  strength bilaterally  Skin: Skin is warm and dry. No rash noted. She is not diaphoretic. No erythema.  Psychiatric: She has a normal mood and affect. Her behavior is normal. Thought content normal.  Nursing note and vitals reviewed.   ED Course  Procedures (including critical care time)  Results for orders placed or performed during the hospital encounter of Sep 22, 2014  CBC with Differential/Platelet  Result Value Ref Range   WBC 12.1 (H) 4.0 - 10.5 K/uL   RBC 4.96 3.87 - 5.11 MIL/uL   Hemoglobin 14.9 12.0 - 15.0 g/dL   HCT 81.1 91.4 - 78.2 %   MCV 92.3 78.0 - 100.0 fL   MCH 30.0 26.0 - 34.0 pg   MCHC 32.5 30.0 - 36.0 g/dL   RDW 95.6 21.3 - 08.6 %   Platelets 269 150 - 400 K/uL   Neutrophils Relative % 80 (H) 43 - 77 %   Neutro Abs 9.7 (H) 1.7 - 7.7 K/uL   Lymphocytes Relative 11 (L) 12 - 46 %   Lymphs Abs 1.3 0.7 - 4.0 K/uL   Monocytes Relative 7 3 - 12 %   Monocytes Absolute 0.9 0.1 - 1.0 K/uL   Eosinophils Relative 2 0 - 5 %   Eosinophils Absolute 0.2 0.0 - 0.7 K/uL   Basophils Relative 0 0 - 1 %   Basophils Absolute 0.0 0.0 - 0.1 K/uL  Comprehensive metabolic panel  Result Value Ref Range   Sodium 137 135 - 145 mmol/L   Potassium 5.5 (H) 3.5 - 5.1 mmol/L   Chloride 101 96 - 112 mmol/L   CO2 25 19 - 32 mmol/L   Glucose, Bld 102 (H) 70 - 99 mg/dL   BUN 32 (H) 6 - 23 mg/dL   Creatinine, Ser 5.78 (H) 0.50 - 1.10 mg/dL   Calcium 9.0 8.4 - 46.9 mg/dL   Total Protein 5.9 (L) 6.0 - 8.3 g/dL   Albumin 3.3 (L) 3.5 - 5.2 g/dL   AST 40 (H) 0 - 37 U/L   ALT 19 0 - 35 U/L   Alkaline Phosphatase 73 39 - 117 U/L   Total Bilirubin 1.4 (H) 0.3 - 1.2 mg/dL   GFR calc non Af Amer 32 (L) >90 mL/min   GFR calc Af Amer 37 (L) >90 mL/min   Anion gap 11 5 - 15  Troponin I  Result Value Ref Range  Troponin I 0.05 (H) <0.031 ng/mL  Brain natriuretic peptide  Result Value Ref Range   B Natriuretic Peptide 525.1 (H) 0.0 - 100.0 pg/mL  I-Stat CG4 Lactic Acid, ED  Result Value Ref Range    Lactic Acid, Venous 1.67 0.5 - 2.0 mmol/L    Labs Review Labs Reviewed  CBC WITH DIFFERENTIAL/PLATELET - Abnormal; Notable for the following:    WBC 12.1 (*)    Neutrophils Relative % 80 (*)    Neutro Abs 9.7 (*)    Lymphocytes Relative 11 (*)    All other components within normal limits  COMPREHENSIVE METABOLIC PANEL - Abnormal; Notable for the following:    Potassium 5.5 (*)    Glucose, Bld 102 (*)    BUN 32 (*)    Creatinine, Ser 1.44 (*)    Total Protein 5.9 (*)    Albumin 3.3 (*)    AST 40 (*)    Total Bilirubin 1.4 (*)    GFR calc non Af Amer 32 (*)    GFR calc Af Amer 37 (*)    All other components within normal limits  TROPONIN I - Abnormal; Notable for the following:    Troponin I 0.05 (*)    All other components within normal limits  BRAIN NATRIURETIC PEPTIDE - Abnormal; Notable for the following:    B Natriuretic Peptide 525.1 (*)    All other components within normal limits  URINALYSIS, ROUTINE W REFLEX MICROSCOPIC  I-STAT CG4 LACTIC ACID, ED    Imaging Review Dg Chest Port 1 View  09/06/2014   CLINICAL DATA:  Productive cough. Shortness of breath. Wheezing. Symptoms started this morning.  EXAM: PORTABLE CHEST - 1 VIEW  COMPARISON:  03/15/2013  FINDINGS: Lower cervical plate and screw fixator. Atherosclerotic calcification of the aortic arch. Heart size within normal limits for projection. Postoperative findings from Mumford procedure on the right.  Borderline central airway thickening. Indistinct pulmonary vasculature is probably technique related. No overt edema.  IMPRESSION: 1. Airway thickening is present, suggesting bronchitis or reactive airways disease.   Electronically Signed   By: Gaylyn Rong M.D.   On: 08/18/2014 13:04     EKG Interpretation   Date/Time:  Friday September 07 2014 11:19:47 EDT Ventricular Rate:  93 PR Interval:    QRS Duration: 70 QT Interval:  344 QTC Calculation: 427 R Axis:   48 Text Interpretation:  Atrial fibrillation  Nonspecific T wave abnormality  Abnormal ECG No significant change since last tracing Confirmed by  Rhunette Croft, MD, Janey Genta [phone]) on 09/06/2014 2:37:32 PM       12:47 PM This provider spoke with Selena Batten, pharmacist. Recommended Xopenex to be used on patient secondary to tearing of eyes with albuterol.  2:31 PM This provider spoke with Dr. Rhona Leavens, [facility] hospitalist. Discussed case, labs, imaging, ED course in great detail. Patient to be admitted for COPD exacerbation to stepdown unit.  MDM   Final diagnoses:  COPD exacerbation  Hypoxia  Hyperkalemia    Medications  sodium chloride 0.9 % bolus 500 mL (not administered)  levalbuterol (XOPENEX) nebulizer solution 0.63 mg (not administered)  doxycycline (VIBRAMYCIN) 100 mg in dextrose 5 % 250 mL IVPB (not administered)  levalbuterol (XOPENEX) nebulizer solution 0.63 mg (0.63 mg Nebulization Given 08/16/2014 1302)    Filed Vitals:   08/19/2014 1155 08/21/2014 1200 08/11/2014 1207 08/15/2014 1302  BP: 129/82 135/86    Pulse: 50 93    Temp:   98.9 F (37.2 C)   TempSrc:   Rectal  Resp: 30 27    Height:      Weight:      SpO2: 90% 90%  90%    EKG atrial fibrillation with a heart rate of 93 bpm. Troponin mildly elevated at 0.05. BNP mildly elevated at 525.1. CBC noted elevated white blood cell count of 12.1. Hemoglobin 14.9, hematocrit 45.8. CMP noted hyperkalemia 5.5, elevated BUN of 32 and creatinine of 1.44 - patient has history of elevated creatinine. Lactic acid 1.67-negative elevation. Chest x-ray noted airway thickening suggesting bronchitis or reactive airway disease. Chest x-ray negative findings for acute pneumonia. Patient presenting to the ED with inspiratory and expiratory wheezes to upper and lower lobes bilaterally-patient given Xopenex with relief. Upon arrival to the ED patient was hypoxic with a pulse ox of 88% on room air-patient does not use oxygen at home. Patient was placed on nasal cannula with very mild increase in pulse ox, patient  was changed to mask with increase of pulse ox to 95%. Patient to be admitted to stepdown unit regarding COPD exacerbation with hypoxia. Patient is hyperkalemic with a potassium of 5.5. Mildly elevated troponin-secondary to demand ischemia, troponin will be cycled. Negative findings of fluid overload. Patient started on IV doxycycline in the ED setting for COPD exacerbation. Discussed plan for admission with patient who agrees to plan of care. Patient to be admitted under the care of [facility] hospitalist. Patient stable for transfer to floor.  Raymon Mutton, PA-C 08/17/2014 [address], PA-C 09/04/2014 1457  [address] PA-C 08/20/2014 1519  Derwood Kaplan, MD 09/06/2014 1737

## 2014-09-07 NOTE — ED Notes (Signed)
Attempted report 

## 2014-09-07 NOTE — ED Notes (Signed)
Pt Desat to 81% while talking to pharmacy tech. Able to coach patient with breathing to 91%.  Respiratory at bedside to apply Venti-mask and assess.  Marissa PA aware.

## 2014-09-08 LAB — CBC
HCT: 45.3 % (ref 36.0–46.0)
HEMOGLOBIN: 14.9 g/dL (ref 12.0–15.0)
MCH: 30 pg (ref 26.0–34.0)
MCHC: 32.9 g/dL (ref 30.0–36.0)
MCV: 91.3 fL (ref 78.0–100.0)
Platelets: 300 10*3/uL (ref 150–400)
RBC: 4.96 MIL/uL (ref 3.87–5.11)
RDW: 14.7 % (ref 11.5–15.5)
WBC: 7.7 10*3/uL (ref 4.0–10.5)

## 2014-09-08 LAB — COMPREHENSIVE METABOLIC PANEL
ALBUMIN: 3.3 g/dL — AB (ref 3.5–5.2)
ALK PHOS: 76 U/L (ref 39–117)
ALT: 14 U/L (ref 0–35)
AST: 19 U/L (ref 0–37)
Anion gap: 14 (ref 5–15)
BILIRUBIN TOTAL: 0.5 mg/dL (ref 0.3–1.2)
BUN: 31 mg/dL — ABNORMAL HIGH (ref 6–23)
CHLORIDE: 101 mmol/L (ref 96–112)
CO2: 22 mmol/L (ref 19–32)
Calcium: 9 mg/dL (ref 8.4–10.5)
Creatinine, Ser: 1.24 mg/dL — ABNORMAL HIGH (ref 0.50–1.10)
GFR calc Af Amer: 45 mL/min — ABNORMAL LOW (ref >90)
GFR calc non Af Amer: 38 mL/min — ABNORMAL LOW (ref >90)
Glucose, Bld: 135 mg/dL — ABNORMAL HIGH (ref 70–99)
POTASSIUM: 4.7 mmol/L (ref 3.5–5.1)
Sodium: 137 mmol/L (ref 135–145)
TOTAL PROTEIN: 6.1 g/dL (ref 6.0–8.3)

## 2014-09-08 MED ORDER — METOPROLOL TARTRATE 1 MG/ML IV SOLN
2.5000 mg | Freq: Once | INTRAVENOUS | Status: AC
  Administered 2014-09-08: 2.5 mg via INTRAVENOUS
  Filled 2014-09-08: qty 5

## 2014-09-08 MED ORDER — ENOXAPARIN SODIUM 80 MG/0.8ML ~~LOC~~ SOLN
80.0000 mg | Freq: Two times a day (BID) | SUBCUTANEOUS | Status: DC
  Administered 2014-09-08 – 2014-09-11 (×6): 80 mg via SUBCUTANEOUS
  Filled 2014-09-08 (×8): qty 0.8

## 2014-09-08 MED ORDER — BENZONATATE 100 MG PO CAPS
200.0000 mg | ORAL_CAPSULE | Freq: Three times a day (TID) | ORAL | Status: DC | PRN
  Administered 2014-09-08 – 2014-09-11 (×5): 200 mg via ORAL
  Filled 2014-09-08 (×5): qty 2

## 2014-09-08 MED ORDER — CETYLPYRIDINIUM CHLORIDE 0.05 % MT LIQD
7.0000 mL | Freq: Two times a day (BID) | OROMUCOSAL | Status: DC
Start: 2014-09-08 — End: 2014-09-14
  Administered 2014-09-08 – 2014-09-13 (×12): 7 mL via OROMUCOSAL

## 2014-09-08 MED ORDER — METHYLPREDNISOLONE SODIUM SUCC 125 MG IJ SOLR
60.0000 mg | Freq: Two times a day (BID) | INTRAMUSCULAR | Status: DC
Start: 2014-09-08 — End: 2014-09-10
  Administered 2014-09-08 – 2014-09-10 (×4): 60 mg via INTRAVENOUS
  Filled 2014-09-08 (×4): qty 0.96

## 2014-09-08 NOTE — Progress Notes (Signed)
Amy Bolton - Stepdown/ICU TEAM Progress Note  Amy Bolton ZOX:[id number] DOB: 01/27/1928 DOA: 08/14/2014 PCP: Amy Bolton  Admit HPI / Brief Narrative: 81 y.o. female with a hx of copd, mild cad, pad, HTN, hld, and CKD who presented to the ED with worsening sob and wheezing. Pt was seen by PCP 4 days prior to admit and was given zpak, PO prednisone, and inhaled bronchodilator. Sx did not improve and pt presented to ED where O2 sats were noted to be in the mid-upper 80's on 4LNC.   HPI/Subjective: The patient feels that she is improved slightly since her admission but is not yet back to her baseline.  She denies chest pain fevers chills nausea or vomiting.  Assessment/Plan:  Acute bronchospastic COPD exacerbation Continue usual aggressive medical therapy  Paroxysmal atrial fibrillation Newly appreciated on monitor and EKG since admission - no known prior history of same - could simply be due to bronchodilator therapy or acute distress of COPD exacerbation - initiate anticoagulation for now - adjust medical therapy to affect greater rate control  Chronic diastolic congestive heart failure No evidence of volume overload at the present time  Obesity - Body mass index is 32.43 kg/(m^2).  Hypertension Follow with adjustment in medications for atrial fibrillation  Chronic kidney disease stage III Creatinine appears to be stable/improving - follow  GERD/hiatal hernia  Hyperlipidemia  Aortic stenosis moderate via echo May 2013 - followed by Dr. Elease Bolton  Known abdominal aortic aneurysm / peripheral vascular disease  Code Status: FULL Family Communication: no family present at time of exam Disposition Plan: SDU  Consultants: None  Procedures: None  Antibiotics: Doxycycline 4/29 >  DVT prophylaxis: Lovenox  Objective: Blood pressure 111/70, pulse 104, temperature 97.4 F (36.3 C), temperature source Oral, resp. rate 22, height 5\' 3"  (Bolton.6 m), weight 83.008 kg  (183 lb), SpO2 90 %.  Intake/Output Summary (Last 24 hours) at 09/08/14 1334 Last data filed at 09/08/14 1200  Gross per 24 hour  Intake    120 ml  Output    600 ml  Net   -480 ml   Exam: General: No acute respiratory distress at rest in bed Lungs: Diffuse expiratory wheeze without prolonged expiratory phase with no focal crackles Cardiovascular: Irregularly irregular with rate approximately 120 bpm without appreciable gallop or rub Abdomen: Nontender, overweight, soft, bowel sounds positive, no rebound, no ascites, no appreciable mass Extremities: No significant cyanosis, clubbing, or edema bilateral lower extremities  Data Reviewed: Basic Metabolic Panel:  Recent Labs Lab 09/06/2014 1157 09/08/14 0231  NA 137 137  K 5.5* 4.7  CL 101 101  CO2 25 22  GLUCOSE 102* 135*  BUN 32* 31*  CREATININE Bolton.44* Bolton.24*  CALCIUM 9.0 9.0    CBC:  Recent Labs Lab 08/26/2014 1157 09/08/14 0231  WBC 12.Bolton* 7.7  NEUTROABS 9.7*  --   HGB 14.9 14.9  HCT 45.8 45.3  MCV 92.3 91.3  PLT 269 300    Liver Function Tests:  Recent Labs Lab 08/19/2014 1157 09/08/14 0231  AST 40* 19  ALT 19 14  ALKPHOS 73 76  BILITOT Bolton.4* 0.5  PROT 5.9* 6.Bolton  ALBUMIN 3.3* 3.3*   Cardiac Enzymes:  Recent Labs Lab 08/30/2014 1157  TROPONINI 0.05*    Recent Results (from the past 240 hour(s))  MRSA PCR Screening     Status: None   Collection Time: 08/31/2014  4:30 PM  Result Value Ref Range Status   MRSA by PCR NEGATIVE NEGATIVE Final  Comment:        The GeneXpert MRSA Assay (FDA approved for NASAL specimens only), is one component of a comprehensive MRSA colonization surveillance program. It is not intended to diagnose MRSA infection nor to guide or monitor treatment for MRSA infections.      Studies:   Recent x-ray studies have been reviewed in detail by the Attending Physician  Scheduled Meds:  Scheduled Meds: . allopurinol  300 mg Oral Daily  . ALPRAZolam  0.25 mg Oral QHS  .  antiseptic oral rinse  7 mL Mouth Rinse BID  . carvedilol  25 mg Oral BID WC  . cholecalciferol  2,000 Units Oral Daily  . fluticasone  2 spray Each Nare QHS  . heparin  5,000 Units Subcutaneous 3 times per day  . levalbuterol  0.63 mg Nebulization Q6H  . levofloxacin (LEVAQUIN) IV  750 mg Intravenous Q48H  . methylPREDNISolone (SOLU-MEDROL) injection  60 mg Intravenous Q6H  . mirtazapine  30 mg Oral QHS  . omega-3 acid ethyl esters  Bolton g Oral Daily  . pantoprazole  40 mg Oral Daily  . sodium chloride  3 mL Intravenous Q12H    Time spent on care of this patient: 35 mins   Amy Bolton , Bolton   [facility] Office  [phone] Pager - Text Page per Loretha Stapler as per below:  On-Call/Text Page:      [email or web address]      password [email or web address]  If 7PM-7AM, please contact night-coverage [email or web address] Password [email or web address] 09/08/2014, Bolton:34 PM   LOS: Bolton day

## 2014-09-08 NOTE — Progress Notes (Signed)
Pt in and out of A fib HR fluctuating 90s-130s. MD notified. Orders to give IV lopressor. Will continue to monitor.

## 2014-09-08 NOTE — Progress Notes (Signed)
ANTICOAGULATION CONSULT NOTE - Initial Consult  Pharmacy Consult for Enoxaparin Indication: atrial fibrillation  Allergies  Allergen Reactions  . Ampicillin Other (See Comments)    Caused colitis  . Other Itching    Eye cream -doesn't know name  . Penicillins Other (See Comments)    Caused colitis  . Sulfa Antibiotics Diarrhea and Other (See Comments)    "muscles drew up"    Patient Measurements: Height: 5\' 3"  (160 cm) Weight: 183 lb (83.008 kg) IBW/kg (Calculated) : 52.4  Vital Signs: Temp: 97.4 F (36.3 C) (04/30 1218) Temp Source: Oral (04/30 1218) BP: 164/81 mmHg (04/30 1400) Pulse Rate: 98 (04/30 1400)  Labs:  Recent Labs  08/29/2014 1157 09/08/14 0231  HGB 14.9 14.9  HCT 45.8 45.3  PLT 269 300  CREATININE 1.44* 1.24*  TROPONINI 0.05*  --     Estimated Creatinine Clearance: 33.8 mL/min (by C-G formula based on Cr of 1.24).   Medical History: Past Medical History  Diagnosis Date  . Atypical chest pain   . Palpitation   . SOB (shortness of breath)     Episodic  . Hypertensive urgency     Episodic  . HTN (hypertension)   . Asthma     Underlying  . Kidney disease     Chronic, stage 3, with creatinine clearance estimated to be about 40  . Anxiety     Chronic  . Constipation   . GERD (gastroesophageal reflux disease)   . Hiatal hernia     Hx of  . Hyperlipidemia   . Osteoarthritis   . CHF (congestive heart failure)   . PVD (peripheral vascular disease)   . AAA (abdominal aortic aneurysm)   . Syncope and collapse     Medications:  Prescriptions prior to admission  Medication Sig Dispense Refill Last Dose  . allopurinol (ZYLOPRIM) 300 MG tablet Take 300 mg by mouth daily.    08/10/2014 at Unknown time  . azithromycin (ZITHROMAX) 250 MG tablet Take 250-500 mg by mouth daily. Take 2 tablets today, then 1 tablet daily for 4 days   09/02/2014 at Unknown time  . carvedilol (COREG) 25 MG tablet Take 1 tablet (25 mg total) by mouth 2 (two) times daily  with a meal.   Hold SBP < 105 60 tablet 3 08/28/2014 at 0730  . cholecalciferol (VITAMIN D) 1000 UNITS tablet Take 2,000 Units by mouth daily.    08/25/2014 at Unknown time  . fluticasone (FLONASE) 50 MCG/ACT nasal spray Place 2 sprays into the nose at bedtime.   09/06/2014 at Unknown time  . furosemide (LASIX) 20 MG tablet Take 40 mg by mouth as needed.    Past Week at Unknown time  . lisinopril (PRINIVIL,ZESTRIL) 20 MG tablet Take 20 mg by mouth daily.   08/30/2014 at Unknown time  . mirtazapine (REMERON) 30 MG tablet Take 30 mg by mouth at bedtime.   09/06/2014 at Unknown time  . pantoprazole (PROTONIX) 40 MG tablet Take 40 mg by mouth as needed.    Past Month at Unknown time  . predniSONE (DELTASONE) 20 MG tablet Take 40 mg by mouth daily with breakfast. For 5 days   09/05/2014 at Unknown time  . Umeclidinium-Vilanterol 62.5-25 MCG/INH AEPB Inhale 1 puff into the lungs daily as needed.   09/06/2014 at Unknown time  . VENTOLIN HFA 108 (90 BASE) MCG/ACT inhaler    09/06/2014 at Unknown time  . ALPRAZolam (XANAX) 0.25 MG tablet Take 0.25 mg by mouth at bedtime. For sleep  Taking  . azithromycin (ZITHROMAX) 250 MG tablet Take 250 mg by mouth daily. zpak     . Omega-3 Fatty Acids (FISH OIL) 1000 MG CAPS Take 1 capsule by mouth daily.   Taking  . oxyCODONE-acetaminophen (PERCOCET/ROXICET) 5-325 MG per tablet Take 1-2 tablets by mouth every 6 (six) hours as needed for moderate pain. (Patient not taking: Reported on 08/19/2014) 50 tablet 0 Taking    Assessment: 81 yo F admitted 08/20/2014 with a COPD exacerbation now in paroxysmal afib.  Pharmacy consulted to dose enoxaparin.  Coag: Afib, to begin enoxaparin, CBC wnl, no bleeding noted CrCl ~ 34 ml/min  Goal of Therapy:  Anti-Xa level 0.6-1 units/ml 4hrs after LMWH dose given Monitor platelets by anticoagulation protocol: Yes   Plan:  Enoxaparin 80 mq q12h Follow periodic CBC and BMET, adjust as indicated. Follow up plan for long term  anticoagulation.  Angelgabriel Willmore C 09/08/2014,5:06 PM

## 2014-09-09 LAB — BASIC METABOLIC PANEL
ANION GAP: 12 (ref 5–15)
BUN: 39 mg/dL — ABNORMAL HIGH (ref 6–20)
CHLORIDE: 99 mmol/L — AB (ref 101–111)
CO2: 25 mmol/L (ref 22–32)
Calcium: 8.7 mg/dL — ABNORMAL LOW (ref 8.9–10.3)
Creatinine, Ser: 1.4 mg/dL — ABNORMAL HIGH (ref 0.44–1.00)
GFR calc Af Amer: 39 mL/min — ABNORMAL LOW (ref >60)
GFR, EST NON AFRICAN AMERICAN: 33 mL/min — AB (ref >60)
Glucose, Bld: 121 mg/dL — ABNORMAL HIGH (ref 70–99)
POTASSIUM: 4.8 mmol/L (ref 3.5–5.1)
Sodium: 136 mmol/L (ref 135–145)

## 2014-09-09 LAB — CBC
HCT: 44.5 % (ref 36.0–46.0)
HEMOGLOBIN: 14.7 g/dL (ref 12.0–15.0)
MCH: 30.3 pg (ref 26.0–34.0)
MCHC: 33 g/dL (ref 30.0–36.0)
MCV: 91.8 fL (ref 78.0–100.0)
Platelets: 249 10*3/uL (ref 150–400)
RBC: 4.85 MIL/uL (ref 3.87–5.11)
RDW: 14.8 % (ref 11.5–15.5)
WBC: 11.8 10*3/uL — ABNORMAL HIGH (ref 4.0–10.5)

## 2014-09-09 LAB — MAGNESIUM: Magnesium: 2.3 mg/dL (ref 1.7–2.4)

## 2014-09-09 LAB — TSH: TSH: 0.19 u[IU]/mL — AB (ref 0.350–4.500)

## 2014-09-09 MED ORDER — FUROSEMIDE 10 MG/ML IJ SOLN
40.0000 mg | Freq: Two times a day (BID) | INTRAMUSCULAR | Status: DC
  Administered 2014-09-09 – 2014-09-11 (×5): 40 mg via INTRAVENOUS
  Filled 2014-09-09 (×6): qty 4

## 2014-09-09 MED ORDER — PANTOPRAZOLE SODIUM 40 MG PO TBEC
40.0000 mg | DELAYED_RELEASE_TABLET | Freq: Two times a day (BID) | ORAL | Status: DC
  Administered 2014-09-09 – 2014-09-12 (×7): 40 mg via ORAL
  Filled 2014-09-09 (×7): qty 1

## 2014-09-09 MED ORDER — LEVALBUTEROL HCL 0.63 MG/3ML IN NEBU
0.6300 mg | INHALATION_SOLUTION | Freq: Three times a day (TID) | RESPIRATORY_TRACT | Status: DC
  Administered 2014-09-09 – 2014-09-14 (×14): 0.63 mg via RESPIRATORY_TRACT
  Filled 2014-09-09 (×28): qty 3

## 2014-09-09 MED ORDER — GUAIFENESIN-DM 100-10 MG/5ML PO SYRP
5.0000 mL | ORAL_SOLUTION | ORAL | Status: DC | PRN
  Administered 2014-09-10 – 2014-09-11 (×4): 5 mL via ORAL
  Filled 2014-09-09 (×5): qty 5

## 2014-09-09 NOTE — Progress Notes (Signed)
[facility] TEAM 1 - Stepdown/ICU TEAM Progress Note  Amira Podolak Gildner ZOX:[id number] DOB: 1927-11-05 DOA: 10-Sep-2014 PCP: [name], MD  Admit HPI / Brief Narrative: 81 y.o. female with a hx of copd, mild cad, pad, HTN, hld, and CKD who presented to the ED with worsening sob and wheezing. Pt was seen by PCP 4 days prior to admit and was given zpak, PO prednisone, and inhaled bronchodilator. Sx did not improve and pt presented to ED where O2 sats were noted to be in the mid-upper 80's on 4LNC.   HPI/Subjective: Patient complains of continued smothering sensation when attempting to lay flat at night as well as frequent violent coughing paroxysms throughout the day.  She denies a sense of palpitations or substernal chest pressure.  Overall she feels that her breathing has not significantly improved over last 24 hours.  She denies abdominal pain nausea vomiting or diarrhea.  Assessment/Plan:   Acute bronchospastic COPD exacerbation Continue usual aggressive medical therapy - add cough suppressant when necessary - maximize GERD treatment  Paroxysmal atrial fibrillation Newly appreciated on monitor and EKG since admission - no known prior history - could simply be due to bronchodilator therapy or acute distress of COPD exacerbation but is persisting - continue full dose Lovenox - rate now well controlled - will ask Cards to see in AM to consider appropriateness of eventual DCCV  Chronic diastolic congestive heart failure baseline wgt appears to be ~82kg and pt presently at 83.5 - net negative 1L since admit - initiate scheduled diuretic and follow   Obesity - Body mass index is 32.64 kg/(m^2).  Hypertension Follow with adjustment in medications for atrial fibrillation  Chronic kidney disease stage III Creatinine appears to be stable - follow  GERD/hiatal hernia Maximize treatment to assure not contributing to frequent cough  Hyperlipidemia  Aortic stenosis moderate via echo May 2013 -  followed by Dr. Elease Hashimoto  Known abdominal aortic aneurysm / peripheral vascular disease  Code Status: FULL Family Communication: no family present at time of exam Disposition Plan: SDU  Consultants: None  Procedures: None  Antibiotics: Doxycycline 4/29 Levaquin 5/1 >  DVT prophylaxis: Lovenox full dose  Objective: Blood pressure 135/71, pulse 87, temperature 98.4 F (36.9 C), temperature source Oral, resp. rate 23, height  (1.6 m), weight 83.553 kg (184 lb 3.2 oz), SpO2 93 %.  Intake/Output Summary (Last 24 hours) at 09/09/14 1534 Last data filed at 09/09/14 0858  Gross per 24 hour  Intake      0 ml  Output    675 ml  Net   -675 ml   Exam: General: No acute respiratory distress at rest in bed Lungs: Diffuse expiratory wheeze remains but is less pronounced than yesterday with no focal crackles Cardiovascular: Irregularly irregular with rate approximately 90 bpm without appreciable gallop or rub Abdomen: Nontender, overweight, soft, bowel sounds positive, no rebound, no ascites, no appreciable mass Extremities: No significant cyanosis, or clubbing, 1+ edema bilateral lower extremities  Data Reviewed: Basic Metabolic Panel:  Recent Labs Lab 2014/09/10 1157 09/08/14 0231 09/09/14 0423  NA 137 137 136  K 5.5* 4.7 4.8  CL 101 101 99*  CO2 GLUCOSE 102* 135* 121*  BUN 32* 31* 39*  CREATININE 1.44* 1.24* 1.40*  CALCIUM 9.0 9.0 8.7*  MG  --   --  2.3    CBC:  Recent Labs Lab 10-Sep-2014 1157 09/08/14 0231 09/09/14 0423  WBC 12.1* 7.7 11.8*  NEUTROABS 9.7*  --   --  HGB 14.9 14.9 14.7  HCT 45.8 45.3 44.5  MCV 92.3 91.3 91.8  PLT 269 300 249    Liver Function Tests:  Recent Labs Lab 08/30/2014 1157 09/08/14 0231  AST 40* 19  ALT 19 14  ALKPHOS 73 76  BILITOT 1.4* 0.5  PROT 5.9* 6.1  ALBUMIN 3.3* 3.3*   Cardiac Enzymes:  Recent Labs Lab 09/08/2014 1157  TROPONINI 0.05*    Recent Results (from the past 240 hour(s))  MRSA PCR  Screening     Status: None   Collection Time: 08/21/2014  4:30 PM  Result Value Ref Range Status   MRSA by PCR NEGATIVE NEGATIVE Final    Comment:        The GeneXpert MRSA Assay (FDA approved for NASAL specimens only), is one component of a comprehensive MRSA colonization surveillance program. It is not intended to diagnose MRSA infection nor to guide or monitor treatment for MRSA infections.      Studies:   Recent x-ray studies have been reviewed in detail by the Attending Physician  Scheduled Meds:  Scheduled Meds: . allopurinol  300 mg Oral Daily  . ALPRAZolam  0.25 mg Oral QHS  . antiseptic oral rinse  7 mL Mouth Rinse BID  . carvedilol  25 mg Oral BID WC  . cholecalciferol  2,000 Units Oral Daily  . enoxaparin (LOVENOX) injection  80 mg Subcutaneous Q12H  . fluticasone  2 spray Each Nare QHS  . levalbuterol  0.63 mg Nebulization TID  . levofloxacin (LEVAQUIN) IV  750 mg Intravenous Q48H  . methylPREDNISolone (SOLU-MEDROL) injection  60 mg Intravenous Q12H  . mirtazapine  30 mg Oral QHS  . omega-3 acid ethyl esters  1 g Oral Daily  . pantoprazole  40 mg Oral Daily  . sodium chloride  3 mL Intravenous Q12H    Time spent on care of this patient: 35 mins   MCCLUNG,JEFFREY T , MD   [facility] Office  [phone] Pager - Text Page per Loretha StaplerAmion as per below:  On-Call/Text Page:      [email or web address]      password [email or web address]  If 7PM-7AM, please contact night-coverage [email or web address] Password [email or web address] 09/09/2014, 3:34 PM   LOS: 2 days

## 2014-09-09 DEATH — deceased

## 2014-09-10 DIAGNOSIS — I4891 Unspecified atrial fibrillation: Secondary | ICD-10-CM | POA: Diagnosis present

## 2014-09-10 DIAGNOSIS — I5033 Acute on chronic diastolic (congestive) heart failure: Secondary | ICD-10-CM

## 2014-09-10 LAB — CBC
HEMATOCRIT: 45.5 % (ref 36.0–46.0)
Hemoglobin: 15 g/dL (ref 12.0–15.0)
MCH: 30.1 pg (ref 26.0–34.0)
MCHC: 33 g/dL (ref 30.0–36.0)
MCV: 91.4 fL (ref 78.0–100.0)
PLATELETS: 273 10*3/uL (ref 150–400)
RBC: 4.98 MIL/uL (ref 3.87–5.11)
RDW: 14.4 % (ref 11.5–15.5)
WBC: 13.1 10*3/uL — AB (ref 4.0–10.5)

## 2014-09-10 LAB — BASIC METABOLIC PANEL
Anion gap: 12 (ref 5–15)
BUN: 50 mg/dL — AB (ref 6–20)
CO2: 27 mmol/L (ref 22–32)
Calcium: 9 mg/dL (ref 8.9–10.3)
Chloride: 99 mmol/L — ABNORMAL LOW (ref 101–111)
Creatinine, Ser: 1.42 mg/dL — ABNORMAL HIGH (ref 0.44–1.00)
GFR calc Af Amer: 38 mL/min — ABNORMAL LOW (ref >60)
GFR, EST NON AFRICAN AMERICAN: 33 mL/min — AB (ref >60)
GLUCOSE: 107 mg/dL — AB (ref 70–99)
Potassium: 4.4 mmol/L (ref 3.5–5.1)
Sodium: 138 mmol/L (ref 135–145)

## 2014-09-10 LAB — T4, FREE: Free T4: 1.14 ng/dL — ABNORMAL HIGH (ref 0.61–1.12)

## 2014-09-10 MED ORDER — METHYLPREDNISOLONE SODIUM SUCC 40 MG IJ SOLR
40.0000 mg | Freq: Two times a day (BID) | INTRAMUSCULAR | Status: DC
  Administered 2014-09-10 – 2014-09-11 (×3): 40 mg via INTRAVENOUS
  Filled 2014-09-10 (×7): qty 1

## 2014-09-10 MED ORDER — SODIUM CHLORIDE 0.9 % IJ SOLN
3.0000 mL | Freq: Two times a day (BID) | INTRAMUSCULAR | Status: DC
  Administered 2014-09-10 – 2014-09-11 (×3): 3 mL via INTRAVENOUS
  Administered 2014-09-12: 10:00:00 via INTRAVENOUS

## 2014-09-10 MED ORDER — DILTIAZEM HCL 30 MG PO TABS
30.0000 mg | ORAL_TABLET | Freq: Four times a day (QID) | ORAL | Status: DC
  Administered 2014-09-10 – 2014-09-13 (×10): 30 mg via ORAL
  Filled 2014-09-10 (×17): qty 1

## 2014-09-10 MED ORDER — SODIUM CHLORIDE 0.9 % IJ SOLN: 3.0000 mL | INTRAMUSCULAR | Status: DC | PRN

## 2014-09-10 MED ORDER — SODIUM CHLORIDE 0.9 % IV SOLN: 250.0000 mL | INTRAVENOUS | Status: DC

## 2014-09-10 NOTE — Progress Notes (Addendum)
[facility] TEAM 1 - Stepdown/ICU TEAM Progress Note  Amy Bolton VHQ:[id number] DOB: 1928-04-19 DOA: Sep 23, 2014 PCP: [name], MD  Admit HPI / Brief Narrative: 81 y.o. female with a hx of copd, mild cad, pad, HTN, hld, and CKD who presented to the ED with worsening sob and wheezing. Pt was seen by PCP 4 days prior to admit and was given zpak, PO prednisone, and inhaled bronchodilator. Sx did not improve and pt presented to ED where O2 sats were noted to be in the mid-upper 80's on 4LNC.   HPI/Subjective: The patient reports significant decrease shortness of breath over the last 24 hours.  She denies substernal chest pressure nausea vomiting or abdominal pain.  She does state she continues to feel weak in general.  Assessment/Plan:   Acute bronchospastic COPD exacerbation Continue usual aggressive medical therapy - cough suppressant when necessary - maximize GERD treatment - appears to be slowly improving  Paroxysmal atrial fibrillation Newly appreciated on monitor and EKG since admission - no known prior history - could simply be due to bronchodilator therapy or acute distress of COPD exacerbation but is persisting - continue full dose Lovenox - rate well controlled - Cards to see today - TSH has returned low today - we will check free T4  Suppressed TSH - possible hyperthyroidism Check free T4  Chronic diastolic congestive heart failure baseline wgt appears to be ~82kg and pt presently at 83.5 - net negative 3.9L since admit - continue scheduled diuretic and follow   Obesity - Body mass index is 27.62 kg/(m^2).  Hypertension Blood pressure currently well controlled  Chronic kidney disease stage III Creatinine remains stable with climbing BUN (on steroid) - will likely soon need to adjust/hold diuretic  GERD/hiatal hernia Maximize treatment to assure not contributing to frequent cough  Hyperlipidemia  Aortic stenosis moderate via echo May 2013 - followed by Dr.  Elease Hashimoto  Known abdominal aortic aneurysm / peripheral vascular disease  Code Status: FULL Family Communication: no family present at time of exam Disposition Plan: SDU  Consultants: Cardiology  Procedures: None  Antibiotics: Doxycycline 4/29 Levaquin 5/1 >  DVT prophylaxis: Lovenox full dose  Objective: Blood pressure 113/65, pulse 95, temperature 97.6 F (36.4 C), temperature source Oral, resp. rate 22, height  (1.6 m), weight 70.7 kg (155 lb 13.8 oz), SpO2 93 %.  Intake/Output Summary (Last 24 hours) at 09/10/14 1044 Last data filed at 09/10/14 0800  Gross per 24 hour  Intake    720 ml  Output   3000 ml  Net  -2280 ml   Exam: General: No acute respiratory distress Lungs: Very faint expiratory wheeze appreciable day with much improved air movement throughout all fields with no focal crackles Cardiovascular: Irregularly irregular with rate approximately 86 bpm without appreciable gallop or rub Abdomen: Nontender, overweight, soft, bowel sounds positive, no rebound, no ascites, no appreciable mass Extremities: No significant cyanosis, or clubbing, trace edema bilateral lower extremities  Data Reviewed: Basic Metabolic Panel:  Recent Labs Lab 09-23-14 1157 09/08/14 0231 09/09/14 0423 09/10/14 0336  NA 137 137 136 138  K 5.5* 4.7 4.8 4.4  CL 101 101 99* 99*  CO2 GLUCOSE 102* 135* 121* 107*  BUN 32* 31* 39* 50*  CREATININE 1.44* 1.24* 1.40* 1.42*  CALCIUM 9.0 9.0 8.7* 9.0  MG  --   --  2.3  --     CBC:  Recent Labs Lab 23-Sep-2014 1157 09/08/14 0231 09/09/14 0423 09/10/14 4132  WBC 12.1* 7.7 11.8* 13.1*  NEUTROABS 9.7*  --   --   --   HGB 14.9 14.9 14.7 15.0  HCT 45.8 45.3 44.5 45.5  MCV 92.3 91.3 91.8 91.4  PLT 269 300 249 273    Liver Function Tests:  Recent Labs Lab 09/06/2014 1157 09/08/14 0231  AST 40* 19  ALT 19 14  ALKPHOS 73 76  BILITOT 1.4* 0.5  PROT 5.9* 6.1  ALBUMIN 3.3* 3.3*   Cardiac Enzymes:  Recent  Labs Lab 08/17/2014 1157  TROPONINI 0.05*    Recent Results (from the past 240 hour(s))  MRSA PCR Screening     Status: None   Collection Time: 08/14/2014  4:30 PM  Result Value Ref Range Status   MRSA by PCR NEGATIVE NEGATIVE Final    Comment:        The GeneXpert MRSA Assay (FDA approved for NASAL specimens only), is one component of a comprehensive MRSA colonization surveillance program. It is not intended to diagnose MRSA infection nor to guide or monitor treatment for MRSA infections.      Studies:   Recent x-ray studies have been reviewed in detail by the Attending Physician  Scheduled Meds:  Scheduled Meds: . allopurinol  300 mg Oral Daily  . ALPRAZolam  0.25 mg Oral QHS  . antiseptic oral rinse  7 mL Mouth Rinse BID  . carvedilol  25 mg Oral BID WC  . cholecalciferol  2,000 Units Oral Daily  . enoxaparin (LOVENOX) injection  80 mg Subcutaneous Q12H  . fluticasone  2 spray Each Nare QHS  . furosemide  40 mg Intravenous Q12H  . levalbuterol  0.63 mg Nebulization TID  . levofloxacin (LEVAQUIN) IV  750 mg Intravenous Q48H  . methylPREDNISolone (SOLU-MEDROL) injection  60 mg Intravenous Q12H  . mirtazapine  30 mg Oral QHS  . omega-3 acid ethyl esters  1 g Oral Daily  . pantoprazole  40 mg Oral BID  . sodium chloride  3 mL Intravenous Q12H    Time spent on care of this patient: 35 mins   Sarh Kirschenbaum T , MD   [facility] Office  [phone] Pager - Text Page per Loretha StaplerAmion as per below:  On-Call/Text Page:      [email or web address]      password [email or web address]  If 7PM-7AM, please contact night-coverage [email or web address] Password [email or web address] 09/10/2014, 10:44 AM   LOS: 3 days

## 2014-09-10 NOTE — Care Management Note (Signed)
Case Management Note  Patient Details  Name: Amy Bolton MRN: [id number] Date of Birth: 02/28/1928  Subjective/Objective:                    Action/Plan:   Expected Discharge Date:                  Expected Discharge Plan:  Home/Self Care  In-House Referral:     Discharge planning Services  CM Consult  Post Acute Care Choice:    Choice offered to:     DME Arranged:    DME Agency:     HH Arranged:    HH Agency:     Status of Service:     Medicare Important Message Given:  Yes Date Medicare IM Given:  09/10/14 Medicare IM give by:  debbie Wave Calzada rn,bsn Date Additional Medicare IM Given:    Additional Medicare Important Message give by:     If discussed at Long Length of Stay Meetings, dates discussed:    Additional Comments:  Hanley HaysDowell, Sydnie Sigmund T, RN 09/10/2014, 10:11 AM

## 2014-09-10 NOTE — Care Management Note (Signed)
Case Management Note  Patient Details  Name: Amy Bolton MRN: [id number] Date of Birth: 01/03/1928  Subjective/Objective:                  Action/Plan:   Expected Discharge Date:                  Expected Discharge Plan:  Home/Self Care  In-House Referral:     Discharge planning Services  CM Consult  Post Acute Care Choice:    Choice offered to:     DME Arranged:    DME Agency:     HH Arranged:    HH Agency:     Status of Service:     Medicare Important Message Given:  Yes Date Medicare IM Given:  09/10/14 Medicare IM give by:  debbie Willine Schwalbe rn,bsn Date Additional Medicare IM Given:    Additional Medicare Important Message give by:     If discussed at Long Length of Stay Meetings, dates discussed:    Additional Comments:  Hanley HaysDowell, Lexi Conaty T, RN 09/10/2014, 1:45 PM

## 2014-09-10 NOTE — Consult Note (Signed)
Cardiology Consult Note   Date: 09/10/2014               Patient Name:  Amy Bolton MRN: [id number]  DOB: 05-10-1928 Age / Sex: 81 y.o., female   PCP: Creola Corn, MD         Requesting Physician: Dr. Lonia Blood, MD    Consulting Reason:  New onset atrial fibrillation     Chief Complaint: dyspnea  History of Present Illness: Amy Bolton is an 81 year old woman with PMH of COPD, PAD s/p stent (2013), CKD3, HTN, HLD, moderate AS, CAD and diastolic dysfunction admitted on 09-09-14 with worsening dypsnea concerning for AECOPD.  She was found to have new onset atrial fibrillation at admission and Cardiology has been consulted regarding appropriateness of DCCV.   She reports increasing DOE (when she gets dressed for bed in the evening) that has been progressively worsening.   She says her breathing is somewhat improved today (however, she is requiring 4.5L via [place]).  She denies CP or palpitations at present.  She reports allergy to Symbicort and says she has been using only Ventolin recently.  She reports hx of heart failure and follows with Dr. Elease Hashimoto.  She is on ACEI, BB and prn Lasix.  She reports MVP, however AS is the only valvular abnormality noted in her chart.  She denies personal or family hx of Afib or other arrhythmia.  She denies hx of CVA or DM.  Meds: Current Facility-Administered Medications  Medication Dose Route Frequency Provider Last Rate Last Dose  . acetaminophen (TYLENOL) tablet 650 mg  650 mg Oral Q6H PRN Jerald Kief, MD       Or  . acetaminophen (TYLENOL) suppository 650 mg  650 mg Rectal Q6H PRN Jerald Kief, MD      . allopurinol (ZYLOPRIM) tablet 300 mg  300 mg Oral Daily Jerald Kief, MD   300 mg at 09/10/14 1010  . ALPRAZolam Prudy Feeler) tablet 0.25 mg  0.25 mg Oral QHS Jerald Kief, MD   0.25 mg at 09/09/14 2200  . antiseptic oral rinse (CPC / CETYLPYRIDINIUM CHLORIDE 0.05%) solution 7 mL  7 mL Mouth Rinse BID Jerald Kief, MD   7 mL at 09/10/14 1000  .  benzonatate (TESSALON) capsule 200 mg  200 mg Oral TID PRN Lonia Blood, MD   200 mg at 09/09/14 1327  . carvedilol (COREG) tablet 25 mg  25 mg Oral BID WC Jerald Kief, MD   25 mg at 09/10/14 0755  . cholecalciferol (VITAMIN D) tablet 2,000 Units  2,000 Units Oral Daily Jerald Kief, MD   2,000 Units at 09/10/14 1010  . enoxaparin (LOVENOX) injection 80 mg  80 mg Subcutaneous Q12H Lonia Blood, MD   80 mg at 09/10/14 0651  . fluticasone (FLONASE) 50 MCG/ACT nasal spray 2 spray  2 spray Each Nare QHS Jerald Kief, MD   2 spray at 09/09/14 2204  . furosemide (LASIX) injection 40 mg  40 mg Intravenous Q12H Lonia Blood, MD   40 mg at 09/10/14 5784  . guaiFENesin-dextromethorphan (ROBITUSSIN DM) 100-10 MG/5ML syrup 5 mL  5 mL Oral Q4H PRN Lonia Blood, MD   5 mL at 09/10/14 1010  . levalbuterol (XOPENEX) nebulizer solution 0.63 mg  0.63 mg Nebulization TID Lonia Blood, MD   0.63 mg at 09/10/14 0849  . levofloxacin (LEVAQUIN) IVPB 750 mg  750 mg Intravenous Q48H Marquita Palms,  RPH   750 mg at 09/09/14 1526  . methylPREDNISolone sodium succinate (SOLU-MEDROL) 125 mg/2 mL injection 60 mg  60 mg Intravenous Q12H Lonia BloodJeffrey T McClung, MD   60 mg at 09/10/14 1010  . mirtazapine (REMERON) tablet 30 mg  30 mg Oral QHS Rolan Lipahomas Michael Callahan, NP   30 mg at 09/09/14 2200  . morphine 2 MG/ML injection 2 mg  2 mg Intravenous Q4H PRN Jerald KiefStephen K Chiu, MD      . omega-3 acid ethyl esters (LOVAZA) capsule 1 g  1 g Oral Daily Jerald KiefStephen K Chiu, MD   1 g at 09/08/14 0907  . pantoprazole (PROTONIX) EC tablet 40 mg  40 mg Oral BID Lonia BloodJeffrey T McClung, MD   40 mg at 09/10/14 1010  . sodium chloride 0.9 % injection 3 mL  3 mL Intravenous Q12H Jerald KiefStephen K Chiu, MD   3 mL at 09/08/14 2103    Allergies: Allergies as of 08/11/2014 - Review Complete 08/23/2014  Allergen Reaction Noted  . Ampicillin Other (See Comments) 07/17/2011  . Other Itching 08/22/2014  . Penicillins Other (See Comments) 07/17/2011   . Sulfa antibiotics Diarrhea and Other (See Comments) 03/13/2013   Past Medical History  Diagnosis Date  . Atypical chest pain   . Palpitation   . SOB (shortness of breath)     Episodic  . Hypertensive urgency     Episodic  . HTN (hypertension)   . Asthma     Underlying  . Kidney disease     Chronic, stage 3, with creatinine clearance estimated to be about 40  . Anxiety     Chronic  . Constipation   . GERD (gastroesophageal reflux disease)   . Hiatal hernia     Hx of  . Hyperlipidemia   . Osteoarthritis   . CHF (congestive heart failure)   . PVD (peripheral vascular disease)   . AAA (abdominal aortic aneurysm)   . Syncope and collapse    Past Surgical History  Procedure Laterality Date  . [place] echocardiography  10-12-08    [facility] on October 12, 2008 showed some diastolic dysfunction, EF 55%, mild tricuspid regurgitation, right ventricular systolic pressure elevated a 40-50 mmHg, mild valvular aortic stenosis noted.  . Cardiovascular stress test  04-19-2008    showing EF 69% with otherwise a normal stress nuclear study  . Tonsillectomy    . Adenoidectomy    . Spine surgery      2001, lumbar at the L4-5 level; in 2004, C-spine operation with fusion at the  C5-6 level; in 2004, left thumb CMC joint operation; in 2005 bilateral rotator cuff surgeries  . Bladder suspension      surgery  . Cardiac catheterization  01-2006    showing minimal proximal left anterior descending artery disease. showing less than 50% stenosis of the proximal LAD and otherwise normal coronary arteries,  . Back surgery      x2  . Iliac artery stent  03/07/2012  . Tonsillectomy    . Appendectomy    . Cholecystectomy    . Eye surgery Bilateral     cataracts  . Abdominal hysterectomy    . Colonoscopy w/ biopsies and polypectomy    . Total hip arthroplasty Right 03/22/2013    Dr Luiz BlareGraves  . Total hip arthroplasty Right 03/22/2013    Procedure: TOTAL HIP ARTHROPLASTY  ANTERIOR APPROACH;  Surgeon: Harvie JuniorJohn L Graves, MD;  Location: [facility] OR;  Service: Orthopedics;  Laterality: Right;  .  Total hip arthroplasty Right    No family history on file. History   Social History  . Marital Status: Widowed    Spouse Name: N/A  . Number of Children: N/A  . Years of Education: N/A   Occupational History  . Not on file.   Social History Main Topics  . Smoking status: Never Smoker   . Smokeless tobacco: Never Used     Comment: Quit 38yrs  . Alcohol Use: No  . Drug Use: No  . Sexual Activity: No   Other Topics Concern  . Not on file   Social History Narrative    Review of Systems: Cardiac:  occasional palpitations, she is unsure if she has had CP, occasional LE edema; denies syncope, orthopnea Pulm:  increased dyspnea, cough and sputum production in the past week GI:  vomiting last week; denies diarrhea or constipation GU:  denies dysuria or difficulty urinating Endo:  cold intolerance, sweating, palpitations; denies tremor, polyphagia, loose or frequent BMs  Physical Exam: Blood pressure 113/65, pulse 95, temperature 97.6 F (36.4 C), temperature source Oral, resp. rate 22, height 5\' 3"  (1.6 m), weight 155 lb 13.8 oz (70.7 kg), SpO2 93 %. General: resting in bed in NAD, pleasant and conversant HEENT: [place]/AT, no JVD Cardiac: distant heart sounds, irregular rhythm, Gr 2/6 mid peaking systolic murmur (RUSB), no rubs or gallops, no carotid bruits, distal pulses intact Pulm:  Rales at L base   Abd: soft, nontender, nondistended, BS present Ext: warm and well perfused, +1 B/L pretibial edema  Pulse sl delayed   Neuro: alert and oriented X3, following commands, responding appropriately, moves extremities spontaneously, no tremor  Lab results: Basic Metabolic Panel:  Recent Labs  16/10/96 0423 09/10/14 0336  NA 136 138  K 4.8 4.4  CL 99* 99*  CO2 25 27  GLUCOSE 121* 107*  BUN 39* 50*  CREATININE 1.40* 1.42*  CALCIUM 8.7* 9.0  MG 2.3  --    Liver  Function Tests:  Recent Labs  08/10/2014 1157 09/08/14 0231  AST 40* 19  ALT 19 14  ALKPHOS 73 76  BILITOT 1.4* 0.5  PROT 5.9* 6.1  ALBUMIN 3.3* 3.3*   CBC:  Recent Labs  08/10/2014 1157  09/09/14 0423 09/10/14 0336  WBC 12.1*  < > 11.8* 13.1*  NEUTROABS 9.7*  --   --   --   HGB 14.9  < > 14.7 15.0  HCT 45.8  < > 44.5 45.5  MCV 92.3  < > 91.8 91.4  PLT 269  < > 249 273  < > = values in this interval not displayed. Cardiac Enzymes:  Recent Labs  08/18/2014 1157  TROPONINI 0.05*   Thyroid Function Tests:  Recent Labs  09/09/14 0423  TSH 0.190*   Urinalysis:  Recent Labs  08/10/2014 1354  COLORURINE YELLOW  LABSPEC 1.014  PHURINE 5.5  GLUCOSEU NEGATIVE  HGBUR NEGATIVE  BILIRUBINUR NEGATIVE  KETONESUR NEGATIVE  PROTEINUR NEGATIVE  UROBILINOGEN 1.0  NITRITE NEGATIVE  LEUKOCYTESUR NEGATIVE   Telemetry:  Irregular, PVCs, HR 90s-110s  Assessment, Plan, & Recommendations by Problem:  New onset atrial fibrillation:  Present at least 3 days (since admission) but perhaps longer.  Rates 90-110s.  Dyspneic, requiring 4.5L currently but as high as 6L today.  CHADsVasc score is 6 (moderate - high risk of CVA).  Likely multifactorial etiology; due to AECOPD and acute decompensated HF (elevated BNP and CXR suggestive of mild volume overload).  Suppressed TSH noted  - obtain 2D  ECHO (last one was 2014 - see d/c summary from 05/2012 for result) - continue Coreg; ADD po diltiazem  qh6 for better rate control now   - continue Lovenox for anticoagulation   - continue IV lasix - continue AECOPD trx - will monitor for spontaneous conversion but plan for TEE-guided cardioversion tomorrow - patient adamant about not taking warfarin but does not give clear reason why; she would be willing to take NOAC but is worried about cost; appreciate pharmacy's assistance with looking into med assistance programs  She has aortic stenosis but I do not think afib is primarily due to this  -  check free T4 to determine if subclinical or overt hyperthyroidism  Acute on chronic diastolic HF:  Last ECHO was 05/16/12 and revelaed EF 60-65%, dynamic obstruction, grade 1 DD, moderate AS.  She appears to be mildly overloaded and SpO2 in low 90s on 4.5L.   Would recommend continued IV diuresis to see if this helps with HR and hypoxia.  Watch BP    Aortic stenosis  Appears moderate on exam  WOuld recomm repeat echo to evaluate    HTN:  Follow as add meds  COPD exacerbation:  Per primary team   Signed: Yolanda Manges, DO 09/10/2014, 11:21 AM   Patient seen and examined  Agree with findings as noted above by A Wilson  I have amended note to reflect my findings.   If rates cannto be controlled and/or patient remains hypoxic requiring signif O2 support would favor TEE cardioversion.  Echo to eval LV function, LA size, aortic valve.   Dietrich Pates

## 2014-09-11 ENCOUNTER — Ambulatory Visit (HOSPITAL_COMMUNITY): Payer: Medicare Other

## 2014-09-11 DIAGNOSIS — R7989 Other specified abnormal findings of blood chemistry: Secondary | ICD-10-CM | POA: Diagnosis present

## 2014-09-11 DIAGNOSIS — I5032 Chronic diastolic (congestive) heart failure: Secondary | ICD-10-CM | POA: Diagnosis present

## 2014-09-11 DIAGNOSIS — R946 Abnormal results of thyroid function studies: Secondary | ICD-10-CM

## 2014-09-11 DIAGNOSIS — K449 Diaphragmatic hernia without obstruction or gangrene: Secondary | ICD-10-CM | POA: Diagnosis present

## 2014-09-11 DIAGNOSIS — I480 Paroxysmal atrial fibrillation: Secondary | ICD-10-CM

## 2014-09-11 DIAGNOSIS — I350 Nonrheumatic aortic (valve) stenosis: Secondary | ICD-10-CM

## 2014-09-11 DIAGNOSIS — N183 Chronic kidney disease, stage 3 unspecified: Secondary | ICD-10-CM | POA: Diagnosis present

## 2014-09-11 DIAGNOSIS — I10 Essential (primary) hypertension: Secondary | ICD-10-CM

## 2014-09-11 LAB — BASIC METABOLIC PANEL
ANION GAP: 14 (ref 5–15)
BUN: 67 mg/dL — ABNORMAL HIGH (ref 6–20)
CALCIUM: 8.9 mg/dL (ref 8.9–10.3)
CHLORIDE: 93 mmol/L — AB (ref 101–111)
CO2: 32 mmol/L (ref 22–32)
Creatinine, Ser: 1.82 mg/dL — ABNORMAL HIGH (ref 0.44–1.00)
GFR calc non Af Amer: 24 mL/min — ABNORMAL LOW (ref >60)
GFR, EST AFRICAN AMERICAN: 28 mL/min — AB (ref >60)
Glucose, Bld: 124 mg/dL — ABNORMAL HIGH (ref 70–99)
Potassium: 4.3 mmol/L (ref 3.5–5.1)
Sodium: 139 mmol/L (ref 135–145)

## 2014-09-11 MED ORDER — ATORVASTATIN CALCIUM 20 MG PO TABS
20.0000 mg | ORAL_TABLET | Freq: Every day | ORAL | Status: DC
  Filled 2014-09-11 (×3): qty 1

## 2014-09-11 MED ORDER — FUROSEMIDE 10 MG/ML IJ SOLN
40.0000 mg | Freq: Every day | INTRAMUSCULAR | Status: DC
  Filled 2014-09-11: qty 4

## 2014-09-11 MED ORDER — ENOXAPARIN SODIUM 80 MG/0.8ML ~~LOC~~ SOLN
80.0000 mg | SUBCUTANEOUS | Status: DC
  Administered 2014-09-12: 80 mg via SUBCUTANEOUS
  Filled 2014-09-11 (×2): qty 0.8

## 2014-09-11 NOTE — Consult Note (Signed)
Cardiology Progress Note   Date: 09/11/2014               Patient Name:  Amy Bolton MRN: [id number]  DOB: 06/26/1927 Age / Sex: 81 y.o., female   PCP: Creola CornJohn Russo, MD         Requesting Physician: Dr. Lonia BloodJeffrey T McClung, MD    Consulting Reason:  New onset atrial fibrillation     Subjective: [name] was seen and examined this morning.  Two of her children are at bedside.  HRs well controlled after addition of po diltiazem yesterday.  She still has oxygen requirement.  Meds: Current Facility-Administered Medications  Medication Dose Route Frequency Provider Last Rate Last Dose  . 0.9 %  sodium chloride infusion  250 mL Intravenous Continuous Dietrich PatesPaula Ross V, MD 10 mL/hr at 09/11/14 0800 250 mL at 09/11/14 0800  . acetaminophen (TYLENOL) tablet 650 mg  650 mg Oral Q6H PRN Jerald KiefStephen K Chiu, MD       Or  . acetaminophen (TYLENOL) suppository 650 mg  650 mg Rectal Q6H PRN Jerald KiefStephen K Chiu, MD      . allopurinol (ZYLOPRIM) tablet 300 mg  300 mg Oral Daily Jerald KiefStephen K Chiu, MD   300 mg at 09/11/14 0955  . ALPRAZolam Prudy Feeler(XANAX) tablet 0.25 mg  0.25 mg Oral QHS Jerald KiefStephen K Chiu, MD   0.25 mg at 09/10/14 2156  . antiseptic oral rinse (CPC / CETYLPYRIDINIUM CHLORIDE 0.05%) solution 7 mL  7 mL Mouth Rinse BID Jerald KiefStephen K Chiu, MD   7 mL at 09/11/14 1000  . benzonatate (TESSALON) capsule 200 mg  200 mg Oral TID PRN Lonia BloodJeffrey T McClung, MD   200 mg at 09/09/14 1327  . carvedilol (COREG) tablet 25 mg  25 mg Oral BID WC Jerald KiefStephen K Chiu, MD   25 mg at 09/11/14 0804  . cholecalciferol (VITAMIN D) tablet 2,000 Units  2,000 Units Oral Daily Jerald KiefStephen K Chiu, MD   2,000 Units at 09/11/14 [phone]  . diltiazem (CARDIZEM) tablet 30 mg  30 mg Oral 4 times per day Yolanda MangesAlex M Wilson, DO   30 mg at 09/11/14 0555  . [START ON 09/12/2014] enoxaparin (LOVENOX) injection 80 mg  80 mg Subcutaneous Q24H Wilhemina BonitoSamson C Lee, [facility]      . fluticasone (FLONASE) 50 MCG/ACT nasal spray 2 spray  2 spray Each Nare QHS Jerald KiefStephen K Chiu, MD   2 spray at 09/09/14 2204   . furosemide (LASIX) injection 40 mg  40 mg Intravenous Q12H Lonia BloodJeffrey T McClung, MD   40 mg at 09/11/14 0804  . guaiFENesin-dextromethorphan (ROBITUSSIN DM) 100-10 MG/5ML syrup 5 mL  5 mL Oral Q4H PRN Lonia BloodJeffrey T McClung, MD   5 mL at 09/11/14 0235  . levalbuterol (XOPENEX) nebulizer solution 0.63 mg  0.63 mg Nebulization TID Lonia BloodJeffrey T McClung, MD   0.63 mg at 09/11/14 0837  . levofloxacin (LEVAQUIN) IVPB 750 mg  750 mg Intravenous Q48H Marquita Palmsorey M Ball, RPH   750 mg at 09/09/14 1526  . methylPREDNISolone sodium succinate (SOLU-MEDROL) 40 mg/mL injection 40 mg  40 mg Intravenous Q12H Lonia BloodJeffrey T McClung, MD   40 mg at 09/10/14 2203  . mirtazapine (REMERON) tablet 30 mg  30 mg Oral QHS Rolan Lipahomas Michael Callahan, NP   30 mg at 09/10/14 2156  . morphine 2 MG/ML injection 2 mg  2 mg Intravenous Q4H PRN Jerald KiefStephen K Chiu, MD   2 mg at 09/11/14 0343  . omega-3 acid ethyl esters (LOVAZA) capsule 1 g  1 g Oral Daily Jerald Kief, MD   1 g at 09/08/14 0907  . pantoprazole (PROTONIX) EC tablet 40 mg  40 mg Oral BID Lonia Blood, MD   40 mg at 09/11/14 0955  . sodium chloride 0.9 % injection 3 mL  3 mL Intravenous Q12H Jerald Kief, MD   3 mL at 09/08/14 2103  . sodium chloride 0.9 % injection 3 mL  3 mL Intravenous Q12H Pricilla Riffle, MD   3 mL at 09/11/14 0955  . sodium chloride 0.9 % injection 3 mL  3 mL Intravenous PRN Pricilla Riffle, MD        Allergies: Allergies as of 09/30/2014 - Review Complete Sep 30, 2014  Allergen Reaction Noted  . Ampicillin Other (See Comments) 07/17/2011  . Other Itching 2014/09/30  . Penicillins Other (See Comments) 07/17/2011  . Sulfa antibiotics Diarrhea and Other (See Comments) 03/13/2013   Past Medical History  Diagnosis Date  . Atypical chest pain   . Palpitation   . SOB (shortness of breath)     Episodic  . Hypertensive urgency     Episodic  . HTN (hypertension)   . Asthma     Underlying  . Kidney disease     Chronic, stage 3, with creatinine clearance estimated  to be about 40  . Anxiety     Chronic  . Constipation   . GERD (gastroesophageal reflux disease)   . Hiatal hernia     Hx of  . Hyperlipidemia   . Osteoarthritis   . CHF (congestive heart failure)   . PVD (peripheral vascular disease)   . AAA (abdominal aortic aneurysm)   . Syncope and collapse    Past Surgical History  Procedure Laterality Date  . US echocardiography  10-12-08    2-D done at [facility] on October 12, 2008 showed some diastolic dysfunction, EF 55%, mild tricuspid regurgitation, right ventricular systolic pressure elevated a 40-50 mmHg, mild valvular aortic stenosis noted.  . Cardiovascular stress test  04-19-2008    showing EF 69% with otherwise a normal stress nuclear study  . Tonsillectomy    . Adenoidectomy    . Spine surgery      2001, lumbar at the L4-5 level; in 2004, C-spine operation with fusion at the  C5-6 level; in 2004, left thumb CMC joint operation; in 2005 bilateral rotator cuff surgeries  . Bladder suspension      surgery  . Cardiac catheterization  01-2006    showing minimal proximal left anterior descending artery disease. showing less than 50% stenosis of the proximal LAD and otherwise normal coronary arteries,  . Back surgery      x2  . Iliac artery stent  03/07/2012  . Tonsillectomy    . Appendectomy    . Cholecystectomy    . Eye surgery Bilateral     cataracts  . Abdominal hysterectomy    . Colonoscopy w/ biopsies and polypectomy    . Total hip arthroplasty Right 03/22/2013    Dr Luiz Blare  . Total hip arthroplasty Right 03/22/2013    Procedure: TOTAL HIP ARTHROPLASTY ANTERIOR APPROACH;  Surgeon: Harvie Junior, MD;  Location: [facility] OR;  Service: Orthopedics;  Laterality: Right;  . Total hip arthroplasty Right    No family history on file. History   Social History  . Marital Status: Widowed    Spouse Name: N/A  . Number of Children: N/A  . Years of Education: N/A   Occupational History  .  Not on file.   Social  History Main Topics  . Smoking status: Never Smoker   . Smokeless tobacco: Never Used     Comment: Quit 54yrs  . Alcohol Use: No  . Drug Use: No  . Sexual Activity: No   Other Topics Concern  . Not on file   Social History Narrative   Physical Exam: Blood pressure 139/82, pulse 84, temperature 98.2 F (36.8 C), temperature source Oral, resp. rate 22, height  (1.6 m), weight 181 lb 14.4 oz (82.509 kg), SpO2 94 %. General: resting in bed in NAD, pleasant and conversant HEENT: [place]/AT, no JVD Cardiac: irregular rhythm, Gr 2/6 mid peaking systolic murmur (RUSB), no rubs or gallops Pulm:  Diffuse wheezes and rhonchi but good air movement; SpO2 90-95% on 6L via [place] Ext: warm and well perfused, no pedal edema Neuro: alert and oriented X3, responding appropriately, moves extremities spontaneously  Lab results: Basic Metabolic Panel:  Recent Labs  16/10/96 0423 09/10/14 0336 09/11/14 0226  NA 136 138 139  K 4.8 4.4 4.3  CL 99* 99* 93*  CO2 25 27 32  GLUCOSE 121* 107* 124*  BUN 39* 50* 67*  CREATININE 1.40* 1.42* 1.82*  CALCIUM 8.7* 9.0 8.9  MG 2.3  --   --    CBC:  Recent Labs  09/09/14 0423 09/10/14 0336  WBC 11.8* 13.1*  HGB 14.7 15.0  HCT 44.5 45.5  MCV 91.8 91.4  PLT 249 273   Thyroid Function Tests:  Recent Labs  09/09/14 0423 09/10/14 1745  TSH 0.190*  --   FREET4  --  1.14*   Telemetry:  Irregular, HR 70s-80s  Assessment, Plan, & Recommendations by Problem:  New onset atrial fibrillation:  Rates now controlled 70s-80s after addition of po diltiazem yesterday.  CHADsVasc score is 6 (moderate - high risk of CVA).  She explained that she does not want to take warfarin because a family member suffered ICH after using warfarin.  We discussed the cost vs benefit of warfarin and NOACs for CVA prevention; she would be willing to take NOAC but is worried about cost. - awaiting 2D ECHO (last one was 2014 - see d/c summary from 05/2012 for result) - continue  Coreg and po diltiazem  qh6 for rate control - continue Lovenox for anticoagulation; will need to switch to po A/C prior to d/c; appreciate pharmacy looking into med assistance programs for NOACs - continue AECOPD trx and diuresis (would switch IV to po Lasix) - had planned for TEE-guided cardioversion today but unable to schedule today; will monitor for spontaneous conversion but plan for TEE-guided cardioversion tomorrow (09/12/14); NPO past MN tonight  Acute on chronic diastolic HF:  Last ECHO was 05/16/12 and revealed EF 60-65%, dynamic obstruction, grade 1 DD, moderate AS.  She was likely mildly volume overloaded at admission given elevated BNP and CXR.  She is net -4.8L since admission.  Her weight was never dramatically increased and she seems at baseline weight.  Would suggest switching from IV to po Lasix since she appears euvolemic today and Cr has increased 1.42 to 1.82.    Aortic stenosis:  Likely not a major contributor to her afib.  Awaiting repeat 2D ECHO to evaluate.  HTN:  Monitor closely with addition of medications for HR control.  Hyperthyroidism:  Suppressed TSH (0.190) and elevated free T4 (1.14).  Will proceed with TEE-guided cardioversion since her Afib is likely multifactorial in the setting of COPD, HF, hyperthyroidism.  She will need  close follow-up for initiation of ATD.  Of course, would avoid amiodarone in this patient going forward.  AECOPD:  Per primary team.  Signed: Yolanda Manges, DO IMTS, PGY2 09/11/2014, 11:06 AM    I have seen and examined the patient along with Yolanda Manges, DO.  I have reviewed the chart, notes and new data.  I agree with PA/NP's note.  Key new complaints: still dyspneic, but feels better. She seems to describe orthopnea as well as pulmonary dyspnea Key examination changes: severe bilateral wheezing and rhonchi, but moving air well and no distress, no tachypnea Key new findings / data: moderately suppressed TSH and borderline high T4  consistent with true hyperthyroidism  PLAN: Avoid amiodarone due to thyroid abnormalities. Avoid high dose or non-selective beta blockers due to reactive airway disease. Echo pending today for EF reevaluation. Plan TEE cardioversion tomorrow. Discussed importance of long term anticoagulation as well as risks/benfits/alternatives to planned TEE/DCCV. This procedure has been fully reviewed with the patient and written informed consent has been obtained.   Thurmon Fair, MD, [facility] [phone] 09/11/2014, 11:56 AM

## 2014-09-11 NOTE — Progress Notes (Signed)
Echocardiogram 2D Echocardiogram has been performed.  Dorothey BasemanReel, Alazar Cherian M 09/11/2014, 3:41 PM

## 2014-09-11 NOTE — Progress Notes (Signed)
ANTICOAGULATION CONSULT NOTE - Initial Consult  Pharmacy Consult for Enoxaparin Indication: atrial fibrillation  Allergies  Allergen Reactions  . Ampicillin Other (See Comments)    Caused colitis  . Other Itching    Eye cream -doesn't know name  . Penicillins Other (See Comments)    Caused colitis  . Sulfa Antibiotics Diarrhea and Other (See Comments)    "muscles drew up"    Patient Measurements: Height:  (160 cm) Weight: 181 lb 14.4 oz (82.509 kg) IBW/kg (Calculated) : 52.4  Vital Signs: Temp: 98.2 F (36.8 C) (05/03 0748) Temp Source: Oral (05/03 0748) BP: 139/82 mmHg (05/03 0748) Pulse Rate: 84 (05/03 0748)  Labs:  Recent Labs  09/09/14 0423 09/10/14 0336 09/11/14 0226  HGB 14.7 15.0  --   HCT 44.5 45.5  --   PLT 249 273  --   CREATININE 1.40* 1.42* 1.82*    Estimated Creatinine Clearance: 23 mL/min (by C-G formula based on Cr of 1.82).   Medical History: Past Medical History  Diagnosis Date  . Atypical chest pain   . Palpitation   . SOB (shortness of breath)     Episodic  . Hypertensive urgency     Episodic  . HTN (hypertension)   . Asthma     Underlying  . Kidney disease     Chronic, stage 3, with creatinine clearance estimated to be about 40  . Anxiety     Chronic  . Constipation   . GERD (gastroesophageal reflux disease)   . Hiatal hernia     Hx of  . Hyperlipidemia   . Osteoarthritis   . CHF (congestive heart failure)   . PVD (peripheral vascular disease)   . AAA (abdominal aortic aneurysm)   . Syncope and collapse     Medications:  Prescriptions prior to admission  Medication Sig Dispense Refill Last Dose  . allopurinol (ZYLOPRIM) 300 MG tablet Take 300 mg by mouth daily.    08/10/2014 at Unknown time  . azithromycin (ZITHROMAX) 250 MG tablet Take 250-500 mg by mouth daily. Take 2 tablets today, then 1 tablet daily for 4 days   08/23/2014 at Unknown time  . carvedilol (COREG) 25 MG tablet Take 1 tablet (25 mg total) by mouth 2  (two) times daily with a meal.   Hold SBP < 105 60 tablet 3 09/04/2014 at 0730  . cholecalciferol (VITAMIN D) 1000 UNITS tablet Take 2,000 Units by mouth daily.    09/03/2014 at Unknown time  . fluticasone (FLONASE) 50 MCG/ACT nasal spray Place 2 sprays into the nose at bedtime.   09/06/2014 at Unknown time  . furosemide (LASIX) 20 MG tablet Take 40 mg by mouth as needed.    Past Week at Unknown time  . lisinopril (PRINIVIL,ZESTRIL) 20 MG tablet Take 20 mg by mouth daily.   09/06/2014 at Unknown time  . mirtazapine (REMERON) 30 MG tablet Take 30 mg by mouth at bedtime.   09/06/2014 at Unknown time  . pantoprazole (PROTONIX) 40 MG tablet Take 40 mg by mouth as needed.    Past Month at Unknown time  . predniSONE (DELTASONE) 20 MG tablet Take 40 mg by mouth daily with breakfast. For 5 days   08/24/2014 at Unknown time  . Umeclidinium-Vilanterol 62.5-25 MCG/INH AEPB Inhale 1 puff into the lungs daily as needed.   09/06/2014 at Unknown time  . VENTOLIN HFA 108 (90 BASE) MCG/ACT inhaler    09/06/2014 at Unknown time  . ALPRAZolam (XANAX) 0.25 MG tablet Take  0.25 mg by mouth at bedtime. For sleep   Taking  . azithromycin (ZITHROMAX) 250 MG tablet Take 250 mg by mouth daily. zpak     . Omega-3 Fatty Acids (FISH OIL) 1000 MG CAPS Take 1 capsule by mouth daily.   Taking  . oxyCODONE-acetaminophen (PERCOCET/ROXICET) 5-325 MG per tablet Take 1-2 tablets by mouth every 6 (six) hours as needed for moderate pain. (Patient not taking: Reported on 08/29/2014) 50 tablet 0 Taking    Assessment: 81 yo F admitted 08/20/2014 with a COPD exacerbation now in paroxysmal afib.  Pharmacy consulted to dose enoxaparin.  Coag: Afib, to begin enoxaparin, CBC wnl, no bleeding noted CrCl 29 ml/min  Goal of Therapy:  Anti-Xa level 0.6-1 units/ml 4hrs after LMWH dose given Monitor platelets by anticoagulation protocol: Yes   Plan:  Enoxaparin 80 mq q24h Follow periodic CBC and BMET, adjust as indicated. Follow up plan for long term  anticoagulation.  Red ChristiansSamson Adyen Bifulco, Pharm. D. Clinical Pharmacy Resident Pager: [phone] Ph: [phone] 09/11/2014 10:10 AM

## 2014-09-11 NOTE — Progress Notes (Signed)
[place] TEAM 1 - Stepdown/ICU TEAM Progress Note  Amy Bolton WUJ:[id number] DOB: 10/29/1927 DOA: 08/18/2014 PCP: Gwen PoundsUSSO,[name], MD  Admit HPI / Brief Narrative: 81 y.o. WF PMHx COPD (not on home O2), mild cad, pad, HTN, hld, and CKD  who presented to the ED with worsening sob and wheezing. Pt was seen by PCP 4 days prior to admit and was given zpak, PO prednisone, and inhaled bronchodilator. Sx did not improve and pt presented to ED where O2 sats were noted to be in the mid-upper 80's on 4LNC.   HPI/Subjective: 5/3 A/O 4, states has difficulty performing ADLs at home (example cannot make a bed without being completely out of breath). States has never been placed on O2. States difficulty sleeping at night and she realizes that she now sleeps with her mouth open, which is also a change for her. States has been exposed to secondhand smoke all overlie except for the last 9 years.   Assessment/Plan: Acute bronchospastic COPD exacerbation -Continue current antibiotics for 7 day course -Patient most likely will need to be discharged on home O2 considering the large dose of O2 required currently to maintain her SPO2 -Titrate O2 to maintain SPO2 89 and 93%  -Xopenex nebulizer TID -Solu-Medrol 40 mg daily  Paroxysmal atrial fibrillation -Currently in A. fib, but rate controlled continue Coreg 25 mg   - continue full dose Lovenox  - TSH has returned low today , however free T4 slightly elevated  Suppressed TSH - possible hyperthyroidism -See paroxysmal A. fib   Chronic diastolic congestive heart failure; baseline wgt ~82kg  -5/3 weight = 82.5 kg; patient at base weight, and creatinine increasing.- decrease furosemide to 40 mg daily -Strict in and out; since admission  -4.7 L  Aortic stenosis moderate via echo May 2013 - followed by Dr. Elease HashimotoNahser  Known abdominal aortic aneurysm / peripheral vascular disease  Hypertension -Blood pressure currently well controlled  Chronic kidney  disease stage III -Creatinine increasing, have decreased furosemide to 40 mg daily   GERD/hiatal hernia -Maximize treatment to assure not contributing to frequent cough  Hyperlipidemia -Lipid panel pending -Start Lipitor 20 mg daily      Code Status: FULL Family Communication: no family present at time of exam Disposition Plan: Per cardiology    Consultants: Dr.Mihai Croitoru (cardiology)     Procedure/Significant Events: 4/29 PCXR; Airway thickening is present, suggesting bronchitis or reactive airways disease 5/3 echocardiogram;- Left ventricle: moderate LVH.-LVEF= 60%to 65%.    Culture 4/29 MRSA by PCR negative 5/3 influenza panel pending  Antibiotics: Levofloxacin 5/1>>  DVT prophylaxis: Lovenox   Devices    LINES / TUBES:      Continuous Infusions: . sodium chloride 250 mL (09/11/14 0800)    Objective: VITAL SIGNS: Temp: 98 F (36.7 C) (05/03 1626) Temp Source: Oral (05/03 1626) BP: 149/55 mmHg (05/03 1626) Pulse Rate: 77 (05/03 1626) SPO2; FIO2:   Intake/Output Summary (Last 24 hours) at 09/11/14 1853 Last data filed at 09/11/14 1829  Gross per 24 hour  Intake   1345 ml  Output   2150 ml  Net   -805 ml     Exam: General: A/O 4, acute respiratory distress (most likely a chronic component also) Lungs: diffuse decreased breath sounds, positive diffuse mild expiratory wheezing, negative crackles Cardiovascular: Irregular irregular rhythm and rate, without murmur gallop or rub normal S1 and S2 Abdomen: Nontender, nondistended, soft, bowel sounds positive, no rebound, no ascites, no appreciable mass Extremities: No significant cyanosis, clubbing, or  edema bilateral lower extremities  Data Reviewed: Basic Metabolic Panel:  Recent Labs Lab 09/08/2014 1157 09/08/14 0231 09/09/14 0423 09/10/14 0336 09/11/14 0226  NA 137 137 136 138 139  K 5.5* 4.7 4.8 4.4 4.3  CL 101 101 99* 99* 93*  CO2 32  GLUCOSE 102* 135* 121*  107* 124*  BUN 32* 31* 39* 50* 67*  CREATININE 1.44* 1.24* 1.40* 1.42* 1.82*  CALCIUM 9.0 9.0 8.7* 9.0 8.9  MG  --   --  2.3  --   --    Liver Function Tests:  Recent Labs Lab 08/11/2014 1157 09/08/14 0231  AST 40* 19  ALT 19 14  ALKPHOS 73 76  BILITOT 1.4* 0.5  PROT 5.9* 6.1  ALBUMIN 3.3* 3.3*   No results for input(s): LIPASE, AMYLASE in the last 168 hours. No results for input(s): AMMONIA in the last 168 hours. CBC:  Recent Labs Lab 08/16/2014 1157 09/08/14 0231 09/09/14 0423 09/10/14 0336  WBC 12.1* 7.7 11.8* 13.1*  NEUTROABS 9.7*  --   --   --   HGB 14.9 14.9 14.7 15.0  HCT 45.8 45.3 44.5 45.5  MCV 92.3 91.3 91.8 91.4  PLT 269 300 249 273   Cardiac Enzymes:  Recent Labs Lab 08/19/2014 1157  TROPONINI 0.05*   BNP (last 3 results)  Recent Labs  09/08/2014 1157  BNP 525.1*    ProBNP (last 3 results) No results for input(s): PROBNP in the last 8760 hours.  CBG: No results for input(s): GLUCAP in the last 168 hours.  Recent Results (from the past 240 hour(s))  MRSA PCR Screening     Status: None   Collection Time: 08/25/2014  4:30 PM  Result Value Ref Range Status   MRSA by PCR NEGATIVE NEGATIVE Final    Comment:        The GeneXpert MRSA Assay (FDA approved for NASAL specimens only), is one component of a comprehensive MRSA colonization surveillance program. It is not intended to diagnose MRSA infection nor to guide or monitor treatment for MRSA infections.      Studies:  Recent x-ray studies have been reviewed in detail by the Attending Physician  Scheduled Meds:  Scheduled Meds: . allopurinol  300 mg Oral Daily  . ALPRAZolam  0.25 mg Oral QHS  . antiseptic oral rinse  7 mL Mouth Rinse BID  . [START ON 09/12/2014] atorvastatin  20 mg Oral q1800  . carvedilol  25 mg Oral BID WC  . cholecalciferol  2,000 Units Oral Daily  . diltiazem  30 mg Oral 4 times per day  . [START ON 09/12/2014] enoxaparin (LOVENOX) injection  80 mg Subcutaneous Q24H    . fluticasone  2 spray Each Nare QHS  . [START ON 09/12/2014] furosemide  40 mg Intravenous Daily  . levalbuterol  0.63 mg Nebulization TID  . levofloxacin (LEVAQUIN) IV  750 mg Intravenous Q48H  . methylPREDNISolone (SOLU-MEDROL) injection  40 mg Intravenous Q12H  . mirtazapine  30 mg Oral QHS  . omega-3 acid ethyl esters  1 g Oral Daily  . pantoprazole  40 mg Oral BID  . sodium chloride  3 mL Intravenous Q12H  . sodium chloride  3 mL Intravenous Q12H    Time spent on care of this patient: 40 mins   Francia Verry, Roselind Messier , MD  [facility] Office  [phone] Pager - [phone]  On-Call/Text Page:      [email or web address]      password [email or web address]  If  7PM-7AM, please contact night-coverage [email or web address] Password [email or web address] 09/11/2014, 6:53 PM   LOS: 4 days   Care during the described time interval was provided by me .  I have reviewed this patient's available data, including medical history, events of note, physical examination, radiology studies and test results as part of my evaluation  Carolyne Littles, MD [phone] Pager

## 2014-09-12 ENCOUNTER — Ambulatory Visit (HOSPITAL_COMMUNITY): Payer: Medicare Other

## 2014-09-12 DIAGNOSIS — I5033 Acute on chronic diastolic (congestive) heart failure: Secondary | ICD-10-CM | POA: Diagnosis present

## 2014-09-12 LAB — CBC WITH DIFFERENTIAL/PLATELET
Basophils Absolute: 0 10*3/uL (ref 0.0–0.1)
Basophils Relative: 0 % (ref 0–1)
EOS ABS: 0 10*3/uL (ref 0.0–0.7)
Eosinophils Relative: 0 % (ref 0–5)
HCT: 45.1 % (ref 36.0–46.0)
HEMOGLOBIN: 15.2 g/dL — AB (ref 12.0–15.0)
LYMPHS ABS: 0.9 10*3/uL (ref 0.7–4.0)
Lymphocytes Relative: 7 % — ABNORMAL LOW (ref 12–46)
MCH: 30.6 pg (ref 26.0–34.0)
MCHC: 33.7 g/dL (ref 30.0–36.0)
MCV: 90.7 fL (ref 78.0–100.0)
Monocytes Absolute: 0.5 10*3/uL (ref 0.1–1.0)
Monocytes Relative: 4 % (ref 3–12)
Neutro Abs: 10.2 10*3/uL — ABNORMAL HIGH (ref 1.7–7.7)
Neutrophils Relative %: 89 % — ABNORMAL HIGH (ref 43–77)
Platelets: 264 10*3/uL (ref 150–400)
RBC: 4.97 MIL/uL (ref 3.87–5.11)
RDW: 14.3 % (ref 11.5–15.5)
WBC: 11.6 10*3/uL — AB (ref 4.0–10.5)

## 2014-09-12 LAB — COMPREHENSIVE METABOLIC PANEL
ALK PHOS: 55 U/L (ref 38–126)
ALT: 24 U/L (ref 14–54)
AST: 22 U/L (ref 15–41)
Albumin: 3 g/dL — ABNORMAL LOW (ref 3.5–5.0)
Anion gap: 14 (ref 5–15)
BUN: 83 mg/dL — ABNORMAL HIGH (ref 6–20)
CO2: 33 mmol/L — AB (ref 22–32)
Calcium: 8.4 mg/dL — ABNORMAL LOW (ref 8.9–10.3)
Chloride: 91 mmol/L — ABNORMAL LOW (ref 101–111)
Creatinine, Ser: 2.1 mg/dL — ABNORMAL HIGH (ref 0.44–1.00)
GFR calc non Af Amer: 20 mL/min — ABNORMAL LOW (ref >60)
GFR, EST AFRICAN AMERICAN: 24 mL/min — AB (ref >60)
GLUCOSE: 124 mg/dL — AB (ref 70–99)
Potassium: 4 mmol/L (ref 3.5–5.1)
SODIUM: 138 mmol/L (ref 135–145)
Total Bilirubin: 0.5 mg/dL (ref 0.3–1.2)
Total Protein: 5.5 g/dL — ABNORMAL LOW (ref 6.5–8.1)

## 2014-09-12 LAB — LIPID PANEL
Cholesterol: 309 mg/dL — ABNORMAL HIGH (ref 0–200)
HDL: 42 mg/dL (ref >40)
LDL CALC: 248 mg/dL — AB (ref 0–99)
TRIGLYCERIDES: 95 mg/dL (ref <150)
Total CHOL/HDL Ratio: 7.4 RATIO
VLDL: 19 mg/dL (ref 0–40)

## 2014-09-12 LAB — MAGNESIUM: Magnesium: 2.2 mg/dL (ref 1.7–2.4)

## 2014-09-12 LAB — INFLUENZA PANEL BY PCR (TYPE A & B)
H1N1 flu by pcr: NOT DETECTED
INFLBPCR: NEGATIVE
Influenza A By PCR: NEGATIVE

## 2014-09-12 MED ORDER — RIVAROXABAN 20 MG PO TABS
20.0000 mg | ORAL_TABLET | ORAL | Status: DC
  Filled 2014-09-12: qty 1

## 2014-09-12 MED ORDER — RIVAROXABAN 20 MG PO TABS
20.0000 mg | ORAL_TABLET | Freq: Every day | ORAL | Status: DC
  Filled 2014-09-12: qty 1

## 2014-09-12 MED ORDER — LEVOFLOXACIN IN D5W 500 MG/100ML IV SOLN: 500.0000 mg | INTRAVENOUS | Status: DC

## 2014-09-12 MED ORDER — FUROSEMIDE 40 MG PO TABS: 40.0000 mg | ORAL_TABLET | Freq: Every day | ORAL | Status: DC

## 2014-09-12 MED ORDER — RIVAROXABAN 15 MG PO TABS
15.0000 mg | ORAL_TABLET | Freq: Every day | ORAL | Status: DC
  Administered 2014-09-12: 15 mg via ORAL
  Filled 2014-09-12 (×2): qty 1

## 2014-09-12 MED ORDER — PREDNISONE 20 MG PO TABS
20.0000 mg | ORAL_TABLET | Freq: Every day | ORAL | Status: DC
  Filled 2014-09-12 (×2): qty 1

## 2014-09-12 NOTE — Progress Notes (Signed)
NURSING PROGRESS NOTE  Amy Sinclairnnie J Pitter [id number] Transfer Data: 09/12/2014 8:02 PM Attending Provider: Lonia BloodJeffrey T McClung, MD HYQ:MVHQI,ONGEPCP:RUSSO,[name], MD Code Status: Full   Amy Bolton is a 81 y.o. female patient transferred from 2H  -No acute distress noted.  -No complaints of shortness of breath.  -No complaints of chest pain.   Cardiac Monitoring: Box # 5 in place. Cardiac monitor yields:atrial fibrillation, with ventricular rate of 74.  Last Documented Vital Signs: Blood pressure 113/53, pulse 87, temperature 97.8 F (36.6 C), temperature source Oral, resp. rate 22, height 5\' 3"  (1.6 m), weight 80.967 kg (178 lb 8 oz), SpO2 96 %.  IV Fluids:  IV in place, occlusive dsg intact without redness, IV cath forearm left, condition patent and no redness none.   Allergies:  Ampicillin; Other; Penicillins; and Sulfa antibiotics  Past Medical History:   has a past medical history of Atypical chest pain; Palpitation; SOB (shortness of breath); Hypertensive urgency; HTN (hypertension); Asthma; Kidney disease; Anxiety; Constipation; GERD (gastroesophageal reflux disease); Hiatal hernia; Hyperlipidemia; Osteoarthritis; CHF (congestive heart failure); PVD (peripheral vascular disease); AAA (abdominal aortic aneurysm); and Syncope and collapse.  Past Surgical History:   has past surgical history that includes us echocardiography (10-12-08); Cardiovascular stress test (04-19-2008); Tonsillectomy; Adenoidectomy; Spine surgery; Bladder suspension; Cardiac catheterization (01-2006); Back surgery; Iliac artery stent (03/07/2012); Tonsillectomy; Appendectomy; Cholecystectomy; Eye surgery (Bilateral); Abdominal hysterectomy; Colonoscopy w/ biopsies and polypectomy; Total hip arthroplasty (Right, 03/22/2013); Total hip arthroplasty (Right, 03/22/2013); and Total hip arthroplasty (Right).  Social History:   reports that she has never smoked. She has never used smokeless tobacco. She reports that she does not drink  alcohol or use illicit drugs.  Skin: intact  Patient/Family orientated to room. Information packet given to patient/family. Admission inpatient armband information verified with patient/family to include name and date of birth and placed on patient arm. Side rails up x 2, fall assessment and education completed with patient/family. Patient/family able to verbalize understanding of risk associated with falls and verbalized understanding to call for assistance before getting out of bed. Call light within reach. Patient/family able to voice and demonstrate understanding of unit orientation instructions.

## 2014-09-12 NOTE — Progress Notes (Signed)
[place] TEAM 1 - Stepdown/ICU TEAM Progress Note  Amy Bolton RUE:[id number] DOB: 03/08/1928 DOA: 09/05/2014 PCP: Amy PoundsUSSO,[name], MD  Admit HPI / Brief Narrative: 81 y.o. female with a hx of copd, mild cad, pad, HTN, hld, and CKD who presented to the ED with worsening sob and wheezing. Pt was seen by PCP 4 days prior to admit and was given zpak, PO prednisone, and inhaled bronchodilator. Sx did not improve and pt presented to ED where O2 sats were noted to be in the mid-upper 80's on 4LNC.   HPI/Subjective: The patient states she is feeling much better today.  She is much less short of breath.  She denies chest pain fevers chills nausea or vomiting.  She states she has a modest appetite.  Assessment/Plan:   Acute bronchospastic COPD exacerbation Continue medical therapy - cough suppressant when necessary - maximize GERD treatment - stabilizing nicely now - wean oxygen as able  Paroxysmal atrial fibrillation Newly appreciated on monitor and EKG since admission - no known prior history - could simply be due to bronchodilator therapy or acute distress of COPD exacerbation but is persisting - transitioned to NOAC which was approved by cardiology despite aortic valve disease - rate well controlled at this time - Cards to planning for cardioversion this admission - TSH low but free T4 only very mildly elevated   Suppressed TSH - possible hyperthyroidism Free T4 only slightly elevated at 1.14 - will need follow-up in 8 weeks for repeat testing - agree with avoiding amiodarone  Chronic diastolic congestive heart failure baseline wgt appears to be ~82kg and pt presently at 80.96 - net negative 5.2L since admit - hold diuretic for now with climbing creatinine and follow trend  Obesity - Body mass index is 31.63 kg/(m^2).  Hypertension Blood pressure currently well controlled  Chronic kidney disease stage III Creatinine has climbed in face of ongoing diuretic - is likely modestly dehydrated  at present - stop diuretic for now and follow trend - see baseline weight noted above  GERD/hiatal hernia Maximize treatment to assure not contributing to frequent cough  Hyperlipidemia  Aortic stenosis moderate via echo May 2013 - followed by Dr. Elease HashimotoNahser  Known abdominal aortic aneurysm / peripheral vascular disease  Code Status: FULL Family Communication: no family present at time of exam Disposition Plan: Stable for transfer to cardiac telemetry bed - to undergo cardioversion prior to discharge - continue to follow kidney function and determine if scheduled diuretic appropriate  Consultants: Cardiology  Procedures: None  Antibiotics: Doxycycline 4/29 Levaquin 5/1 > 5/4  DVT prophylaxis: Rivaroxaban  Objective: Blood pressure 104/56, pulse 93, temperature 97.5 F (36.4 C), temperature source Oral, resp. rate 19, height 5\' 3"  (1.6 m), weight 80.967 kg (178 lb 8 oz), SpO2 95 %.  Intake/Output Summary (Last 24 hours) at 09/12/14 1346 Last data filed at 09/12/14 1200  Gross per 24 hour  Intake    863 ml  Output   1050 ml  Net   -187 ml   Exam: General: No acute respiratory distress - alert and pleasant Lungs: Distant breath sounds throughout all fields but no wheezes appreciable and no crackles Cardiovascular: Irregularly irregular with rate approximately 80 bpm without appreciable gallop or rub Abdomen: Nontender, overweight, soft, bowel sounds positive, no rebound, no ascites, no appreciable mass Extremities: No significant cyanosis, or clubbing, or edema bilateral lower extremities  Data Reviewed: Basic Metabolic Panel:  Recent Labs Lab 09/08/14 0231 09/09/14 0423 09/10/14 [id number] 09/11/14 0226 09/12/14 0225  NA 137 136 138 139 138  K 4.7 4.8 4.4 4.3 4.0  CL 101 99* 99* 93* 91*  CO2 22 25 27  32 33*  GLUCOSE 135* 121* 107* 124* 124*  BUN 31* 39* 50* 67* 83*  CREATININE 1.24* 1.40* 1.42* 1.82* 2.10*  CALCIUM 9.0 8.7* 9.0 8.9 8.4*  MG  --  2.3  --   --  2.2      CBC:  Recent Labs Lab 08/27/2014 1157 09/08/14 0231 09/09/14 0423 09/10/14 0336 09/12/14 0225  WBC 12.1* 7.7 11.8* 13.1* 11.6*  NEUTROABS 9.7*  --   --   --  10.2*  HGB 14.9 14.9 14.7 15.0 15.2*  HCT 45.8 45.3 44.5 45.5 45.1  MCV 92.3 91.3 91.8 91.4 90.7  PLT 269 300 249 273 264    Liver Function Tests:  Recent Labs Lab 08/11/2014 1157 09/08/14 0231 09/12/14 0225  AST 40* 19 22  ALT 19 14 24   ALKPHOS 73 76 55  BILITOT 1.4* 0.5 0.5  PROT 5.9* 6.1 5.5*  ALBUMIN 3.3* 3.3* 3.0*   Cardiac Enzymes:  Recent Labs Lab 08/16/2014 1157  TROPONINI 0.05*    Recent Results (from the past 240 hour(s))  MRSA PCR Screening     Status: None   Collection Time: 08/27/2014  4:30 PM  Result Value Ref Range Status   MRSA by PCR NEGATIVE NEGATIVE Final    Comment:        The GeneXpert MRSA Assay (FDA approved for NASAL specimens only), is one component of a comprehensive MRSA colonization surveillance program. It is not intended to diagnose MRSA infection nor to guide or monitor treatment for MRSA infections.      Studies:   Recent x-ray studies have been reviewed in detail by the Attending Physician  Scheduled Meds:  Scheduled Meds: . allopurinol  300 mg Oral Daily  . ALPRAZolam  0.25 mg Oral QHS  . antiseptic oral rinse  7 mL Mouth Rinse BID  . atorvastatin  20 mg Oral q1800  . carvedilol  25 mg Oral BID WC  . cholecalciferol  2,000 Units Oral Daily  . diltiazem  30 mg Oral 4 times per day  . fluticasone  2 spray Each Nare QHS  . furosemide  40 mg Intravenous Daily  . levalbuterol  0.63 mg Nebulization TID  . [START ON 09/21/2014] levofloxacin (LEVAQUIN) IV  500 mg Intravenous Q48H  . methylPREDNISolone (SOLU-MEDROL) injection  40 mg Intravenous Q12H  . mirtazapine  30 mg Oral QHS  . omega-3 acid ethyl esters  1 g Oral Daily  . pantoprazole  40 mg Oral BID  . rivaroxaban  15 mg Oral Q supper  . sodium chloride  3 mL Intravenous Q12H  . sodium chloride  3 mL  Intravenous Q12H    Time spent on care of this patient: 35 mins   MCCLUNG,JEFFREY T , MD   [facility] Office  [phone] Pager - Text Page per Loretha StaplerAmion as per below:  On-Call/Text Page:      [email or web address]      password [email or web address]  If 7PM-7AM, please contact night-coverage [email or web address] Password [email or web address] 09/12/2014, 1:46 PM   LOS: 5 days

## 2014-09-12 NOTE — Care Management Note (Signed)
Case Management Note  Patient Details  Name: Darcus Austinnnie J Bembenek MRN: [id number] Date of Birth: 07/31/1927  Subjective/Objective:                    Action/Plan:   Expected Discharge Date:                  Expected Discharge Plan:  Home/Self Care  In-House Referral:     Discharge planning Services  CM Consult  Post Acute Care Choice:    Choice offered to:     DME Arranged:    DME Agency:     HH Arranged:    HH Agency:     Status of Service:     Medicare Important Message Given:  Yes Date Medicare IM Given:  09/10/14 Medicare IM give by:  debbie Samarie Pinder rn,bsn Date Additional Medicare IM Given:    Additional Medicare Important Message give by:     If discussed at Long Length of Stay Meetings, dates discussed:  09/21/2014  Additional Comments:  Hanley HaysDowell, Eisa Necaise T, RN 09/12/2014, 9:38 AM

## 2014-09-12 NOTE — Progress Notes (Signed)
Cardiology Progress Note  Subjective: [name] was seen and examined this morning.   She continues to require oxygen supplementation.  Objective: Vital signs in last 24 hours: Filed Vitals:   09/11/14 2017 09/11/14 2340 09/12/14 0400 09/12/14 0402  BP:  117/66  129/73  Pulse: 67 92  81  Temp:  97.4 F (36.3 C)  96.2 F (35.7 C)  TempSrc:  Oral  Axillary  Resp: 14 24  17   Height:      Weight:   178 lb 8 oz (80.967 kg)   SpO2: 96% 96%  94%   Weight change: -5 lb 8 oz (-2.495 kg)  Intake/Output Summary (Last 24 hours) at 09/12/14 0732 Last data filed at 09/12/14 0600  Gross per 24 hour  Intake   1200 ml  Output   1850 ml  Net   -650 ml   General: sitting up in bed in NAD, watching tv; pleasant, conversant, cooperative with exam HEENT: [place]/AT, no JVD Cardiac: irregular rhythm; 2/6 mid peaking systolic murmur (heard best at RUSB), no rubs or gallops Pulm: few wheezes with some rhonchi (much improved from yesterday's exam), moving normal volumes of air; SpO2 mid 90s on 5L via [place] Abd: soft, nontender, nondistended, BS present Ext: warm and well perfused, no pedal edema Neuro: alert and oriented X3, responding appropriately, moves extremities spontaneously  Lab Results: Basic Metabolic Panel:  Recent Labs Lab 09/09/14 0423  09/11/14 0226 09/12/14 0225  NA 136  < > 139 138  K 4.8  < > 4.3 4.0  CL 99*  < > 93* 91*  CO2 25  < > 32 33*  GLUCOSE 121*  < > 124* 124*  BUN 39*  < > 67* 83*  CREATININE 1.40*  < > 1.82* 2.10*  CALCIUM 8.7*  < > 8.9 8.4*  MG 2.3  --   --  2.2  < > = values in this interval not displayed. Liver Function Tests:  Recent Labs Lab 09/08/14 0231 09/12/14 0225  AST 19 22  ALT 14 24  ALKPHOS 76 55  BILITOT 0.5 0.5  PROT 6.1 5.5*  ALBUMIN 3.3* 3.0*   CBC:  Recent Labs Lab 08/19/2014 1157  09/10/14 0336 09/12/14 0225  WBC 12.1*  < > 13.1* 11.6*  NEUTROABS 9.7*  --   --  10.2*  HGB 14.9  < > 15.0 15.2*  HCT 45.8  < > 45.5 45.1  MCV  92.3  < > 91.4 90.7  PLT 269  < > 273 264  < > = values in this interval not displayed.  Fasting Lipid Panel:  Recent Labs Lab 09/12/14 0225  CHOL 309*  HDL 42  LDLCALC 248*  TRIG 95  CHOLHDL 7.4   Medications: I have reviewed the patient's current medications. Scheduled Meds: . allopurinol  300 mg Oral Daily  . ALPRAZolam  0.25 mg Oral QHS  . antiseptic oral rinse  7 mL Mouth Rinse BID  . atorvastatin  20 mg Oral q1800  . carvedilol  25 mg Oral BID WC  . cholecalciferol  2,000 Units Oral Daily  . diltiazem  30 mg Oral 4 times per day  . enoxaparin (LOVENOX) injection  80 mg Subcutaneous Q24H  . fluticasone  2 spray Each Nare QHS  . furosemide  40 mg Intravenous Daily  . levalbuterol  0.63 mg Nebulization TID  . levofloxacin (LEVAQUIN) IV  750 mg Intravenous Q48H  . methylPREDNISolone (SOLU-MEDROL) injection  40 mg Intravenous Q12H  . mirtazapine  30 mg Oral QHS  . omega-3 acid ethyl esters  1 g Oral Daily  . pantoprazole  40 mg Oral BID  . sodium chloride  3 mL Intravenous Q12H  . sodium chloride  3 mL Intravenous Q12H   Continuous Infusions: . sodium chloride 250 mL (09/11/14 2000)   PRN Meds:.acetaminophen **OR** acetaminophen, benzonatate, guaiFENesin-dextromethorphan, morphine injection, sodium chloride   Telemetry:  Irregular, HR 90s 2D ECHO 09/11/14:  LV EF 60-65%, moderate LVH, normal WM, mild AR  Assessment/Plan:  New onset atrial fibrillation:  Rate controlled on po diltiazem and Coreg.  CHADsVasc score is 6 (moderate - high risk of CVA).  She has agreed to A/C with a NOAC and I appreciate pharmacist's assistance with obtaining NOAC at reasonable cost to the patient. She is willing and able to pay the $42/month co-pay for NOAC.  Unfortunately, we will need to hold off on DCCV until she has reached steady state on NOAC (will wait at least 3 doses- tentative TEE date 01/19/2015).  Given her continued oxygen requirement and co-morbidities she will need to remain  hospitalized until the procedure (as opposed to going home and returning for DCCV). - she seems stable for transfer to telemetry; will leave to discretion of the primary team - continue Coreg and po diltiazem 30mg  qh6 for rate control - STOP Lovenox, START Xarelto 15mg  daily today (ok with CrCl 25.0 based on today's Cr 2.10 which is higher than baseline Cr)  - continue AECOPD trx and diuresis (would switch IV to po Lasix since her Cr is rising) - will monitor for spontaneous conversion but plan for TEE-guided cardioversion on 01/19/2015 after she has received 3 doses of Xarelto; please ensure patient NPO after MN on 01/19/2015  Acute on chronic diastolic HF:  2D ECHO completed 09/11/14 reveals LV EF 60-65%, moderate LVH, normal WM, mild AR.  She was likely mildly volume overloaded at admission given elevated BNP and CXR, however she is net -5.1L since admission, her weight is below baseline and she appears euvolemic on exam.   - recommend switching to po Lasix since she appears euvolemic today and Cr has increased 1.42 to 2.10 in past two days - continue I&Os and daily weights  Aortic stenosis:  Likely not a major contributor to her afib.  In 2014, valve area was 0.84cm^2 and peak gradient was 21mmHg.  09/11/14 ECHO - valve area is 1.51cm^2, mean gradient 16mmHg.  HTN:  Monitor closely with addition of medications for HR control.  Hyperthyroidism:  Suppressed TSH (0.190) and elevated free T4 (1.14).  Will proceed with TEE-guided cardioversion since her Afib is likely multifactorial in the setting of COPD, HF, hyperthyroidism.  She will need close follow-up for initiation of ATD.  Of course, would avoid amiodarone in this patient going forward.  AECOPD:  Per primary team.    LOS: 5 days   Yolanda MangesAlex M Wilson, DO IMTS, PGY2 09/12/2014, 7:32 AM  I have seen and examined the patient along with Yolanda MangesAlex M Wilson, DO.  I have reviewed the chart, notes and new data.  I agree with her note.  Key new complaints:  breathing improved Key examination changes: markedly reduced wheezing Key new findings / data: wants long term NOAC and refuses warfarin. In view of need for uninterrupted anticoagulation and challenge of seamless lovenox to NOAC transition, delay TEE-DCCV until Friday   Thurmon FairMihai Corinthian Mizrahi, MD, [facility] CenterFACC [facility] [phone] 09/12/2014, 1:51 PM

## 2014-09-13 ENCOUNTER — Inpatient Hospital Stay (HOSPITAL_COMMUNITY): Payer: Medicare Other

## 2014-09-13 ENCOUNTER — Encounter (HOSPITAL_COMMUNITY): Admission: EM | Disposition: E | Payer: Self-pay | Source: Home / Self Care | Attending: Internal Medicine

## 2014-09-13 DIAGNOSIS — N189 Chronic kidney disease, unspecified: Secondary | ICD-10-CM

## 2014-09-13 DIAGNOSIS — I2510 Atherosclerotic heart disease of native coronary artery without angina pectoris: Secondary | ICD-10-CM

## 2014-09-13 DIAGNOSIS — N179 Acute kidney failure, unspecified: Secondary | ICD-10-CM

## 2014-09-13 DIAGNOSIS — R1031 Right lower quadrant pain: Secondary | ICD-10-CM

## 2014-09-13 DIAGNOSIS — R579 Shock, unspecified: Secondary | ICD-10-CM

## 2014-09-13 DIAGNOSIS — R109 Unspecified abdominal pain: Secondary | ICD-10-CM | POA: Diagnosis present

## 2014-09-13 DIAGNOSIS — R0902 Hypoxemia: Secondary | ICD-10-CM

## 2014-09-13 LAB — BRAIN NATRIURETIC PEPTIDE: B Natriuretic Peptide: 98.6 pg/mL (ref 0.0–100.0)

## 2014-09-13 LAB — CBC
HCT: 43 % (ref 36.0–46.0)
HEMATOCRIT: 37.2 % (ref 36.0–46.0)
HEMOGLOBIN: 12.3 g/dL (ref 12.0–15.0)
Hemoglobin: 14.6 g/dL (ref 12.0–15.0)
MCH: 30.6 pg (ref 26.0–34.0)
MCH: 29.9 pg (ref 26.0–34.0)
MCHC: 34 g/dL (ref 30.0–36.0)
MCHC: 33.1 g/dL (ref 30.0–36.0)
MCV: 90.1 fL (ref 78.0–100.0)
MCV: 90.3 fL (ref 78.0–100.0)
PLATELETS: 306 10*3/uL (ref 150–400)
Platelets: 241 10*3/uL (ref 150–400)
RBC: 4.77 MIL/uL (ref 3.87–5.11)
RBC: 4.12 MIL/uL (ref 3.87–5.11)
RDW: 14 % (ref 11.5–15.5)
RDW: 14.2 % (ref 11.5–15.5)
WBC: 14.2 10*3/uL — ABNORMAL HIGH (ref 4.0–10.5)
WBC: 15.6 10*3/uL — AB (ref 4.0–10.5)

## 2014-09-13 LAB — URINALYSIS, ROUTINE W REFLEX MICROSCOPIC
Bilirubin Urine: NEGATIVE
GLUCOSE, UA: NEGATIVE mg/dL
Hgb urine dipstick: NEGATIVE
Ketones, ur: NEGATIVE mg/dL
Nitrite: NEGATIVE
PH: 5 (ref 5.0–8.0)
Protein, ur: NEGATIVE mg/dL
Specific Gravity, Urine: 1.019 (ref 1.005–1.030)
Urobilinogen, UA: 1 mg/dL (ref 0.0–1.0)

## 2014-09-13 LAB — RENAL FUNCTION PANEL
ALBUMIN: 3 g/dL — AB (ref 3.5–5.0)
Anion gap: 11 (ref 5–15)
BUN: 83 mg/dL — AB (ref 6–20)
CALCIUM: 8.7 mg/dL — AB (ref 8.9–10.3)
CO2: 34 mmol/L — ABNORMAL HIGH (ref 22–32)
CREATININE: 1.8 mg/dL — AB (ref 0.44–1.00)
Chloride: 92 mmol/L — ABNORMAL LOW (ref 101–111)
GFR calc Af Amer: 28 mL/min — ABNORMAL LOW (ref >60)
GFR calc non Af Amer: 25 mL/min — ABNORMAL LOW (ref >60)
GLUCOSE: 178 mg/dL — AB (ref 70–99)
PHOSPHORUS: 5.1 mg/dL — AB (ref 2.5–4.6)
Potassium: 4.2 mmol/L (ref 3.5–5.1)
Sodium: 137 mmol/L (ref 135–145)

## 2014-09-13 LAB — COMPREHENSIVE METABOLIC PANEL
ALT: 24 U/L (ref 14–54)
AST: 26 U/L (ref 15–41)
Albumin: 2.2 g/dL — ABNORMAL LOW (ref 3.5–5.0)
Alkaline Phosphatase: 38 U/L (ref 38–126)
Anion gap: 10 (ref 5–15)
BUN: 85 mg/dL — ABNORMAL HIGH (ref 6–20)
CALCIUM: 7.3 mg/dL — AB (ref 8.9–10.3)
CO2: 32 mmol/L (ref 22–32)
Chloride: 93 mmol/L — ABNORMAL LOW (ref 101–111)
Creatinine, Ser: 2.24 mg/dL — ABNORMAL HIGH (ref 0.44–1.00)
GFR calc non Af Amer: 19 mL/min — ABNORMAL LOW (ref >60)
GFR, EST AFRICAN AMERICAN: 22 mL/min — AB (ref >60)
GLUCOSE: 217 mg/dL — AB (ref 70–99)
Potassium: 4.4 mmol/L (ref 3.5–5.1)
SODIUM: 135 mmol/L (ref 135–145)
TOTAL PROTEIN: 3.8 g/dL — AB (ref 6.5–8.1)
Total Bilirubin: 0.7 mg/dL (ref 0.3–1.2)

## 2014-09-13 LAB — POCT I-STAT 3, ART BLOOD GAS (G3+)
Bicarbonate: 28.2 mEq/L — ABNORMAL HIGH (ref 20.0–24.0)
Bicarbonate: 26.1 mEq/L — ABNORMAL HIGH (ref 20.0–24.0)
O2 Saturation: 89 %
O2 Saturation: 97 %
PCO2 ART: 47.7 mmHg — AB (ref 35.0–45.0)
Patient temperature: 96.9
TCO2: 30 mmol/L (ref 0–100)
TCO2: 28 mmol/L (ref 0–100)
pCO2 arterial: 59.2 mmHg (ref 35.0–45.0)
pH, Arterial: 7.282 — ABNORMAL LOW (ref 7.350–7.450)
pH, Arterial: 7.341 — ABNORMAL LOW (ref 7.350–7.450)
pO2, Arterial: 61 mmHg — ABNORMAL LOW (ref 80.0–100.0)
pO2, Arterial: 89 mmHg (ref 80.0–100.0)

## 2014-09-13 LAB — PROTIME-INR
INR: 1.48 (ref 0.00–1.49)
Prothrombin Time: 18 seconds — ABNORMAL HIGH (ref 11.6–15.2)

## 2014-09-13 LAB — LACTIC ACID, PLASMA
LACTIC ACID, VENOUS: 2.9 mmol/L — AB (ref 0.5–2.0)
Lactic Acid, Venous: 1.8 mmol/L (ref 0.5–2.0)
Lactic Acid, Venous: 3.8 mmol/L (ref 0.5–2.0)

## 2014-09-13 LAB — TROPONIN I

## 2014-09-13 LAB — HEMOGLOBIN AND HEMATOCRIT, BLOOD
HCT: 40.6 % (ref 36.0–46.0)
Hemoglobin: 13.5 g/dL (ref 12.0–15.0)

## 2014-09-13 LAB — PREPARE RBC (CROSSMATCH)

## 2014-09-13 LAB — URINE MICROSCOPIC-ADD ON

## 2014-09-13 LAB — LIPASE, BLOOD: LIPASE: 31 U/L (ref 22–51)

## 2014-09-13 LAB — APTT: APTT: 28 s (ref 24–37)

## 2014-09-13 SURGERY — ECHOCARDIOGRAM, TRANSESOPHAGEAL
Anesthesia: Monitor Anesthesia Care

## 2014-09-13 MED ORDER — PROTHROMBIN COMPLEX CONC HUMAN 500 UNITS IV KIT
3585.0000 [IU] | PACK | Status: AC
  Administered 2014-09-13: 3585 [IU] via INTRAVENOUS
  Filled 2014-09-13: qty 143

## 2014-09-13 MED ORDER — IOHEXOL 300 MG/ML  SOLN
25.0000 mL | INTRAMUSCULAR | Status: AC
  Administered 2014-09-13: 25 mL via ORAL

## 2014-09-13 MED ORDER — SODIUM CHLORIDE 0.9 % IV BOLUS (SEPSIS)
500.0000 mL | Freq: Once | INTRAVENOUS | Status: AC
  Administered 2014-09-13: 500 mL via INTRAVENOUS

## 2014-09-13 MED ORDER — SODIUM CHLORIDE 0.9 % IV SOLN: Freq: Once | INTRAVENOUS | Status: DC

## 2014-09-13 MED ORDER — METOPROLOL TARTRATE 1 MG/ML IV SOLN: 2.5000 mg | INTRAVENOUS | Status: DC | PRN

## 2014-09-13 MED ORDER — MORPHINE SULFATE 4 MG/ML IJ SOLN
4.0000 mg | INTRAMUSCULAR | Status: DC | PRN
  Administered 2014-09-13 (×3): 4 mg via INTRAVENOUS
  Filled 2014-09-13 (×3): qty 1

## 2014-09-13 MED ORDER — HYDROCORTISONE NA SUCCINATE PF 100 MG IJ SOLR
50.0000 mg | Freq: Four times a day (QID) | INTRAMUSCULAR | Status: DC
  Administered 2014-09-13 – 2014-09-15 (×7): 50 mg via INTRAVENOUS
  Filled 2014-09-13: qty 1
  Filled 2014-09-13 (×2): qty 2
  Filled 2014-09-13: qty 1
  Filled 2014-09-13: qty 2
  Filled 2014-09-13 (×6): qty 1
  Filled 2014-09-13: qty 2

## 2014-09-13 MED ORDER — ONDANSETRON HCL 4 MG/2ML IJ SOLN
4.0000 mg | Freq: Four times a day (QID) | INTRAMUSCULAR | Status: DC | PRN
  Administered 2014-09-13 – 2014-09-14 (×4): 4 mg via INTRAVENOUS
  Filled 2014-09-13 (×5): qty 2

## 2014-09-13 MED ORDER — MORPHINE SULFATE 2 MG/ML IJ SOLN
2.0000 mg | INTRAMUSCULAR | Status: DC | PRN
  Administered 2014-09-15: 4 mg via INTRAVENOUS
  Administered 2014-09-15: 2 mg via INTRAVENOUS
  Filled 2014-09-13: qty 1
  Filled 2014-09-13: qty 2

## 2014-09-13 MED ORDER — NOREPINEPHRINE BITARTRATE 1 MG/ML IV SOLN
2.0000 ug/min | INTRAVENOUS | Status: DC
  Administered 2014-09-14 – 2014-09-15 (×4): 50 ug/min via INTRAVENOUS
  Administered 2014-09-15: 40 ug/min via INTRAVENOUS
  Administered 2014-09-15: 50 ug/min via INTRAVENOUS
  Filled 2014-09-13 (×7): qty 16

## 2014-09-13 MED ORDER — MORPHINE SULFATE 4 MG/ML IJ SOLN: 4.0000 mg | INTRAMUSCULAR | Status: DC | PRN

## 2014-09-13 MED ORDER — PROTHROMBIN COMPLEX CONC HUMAN 500 UNITS IV KIT
50.0000 [IU]/kg | PACK | Status: DC
  Filled 2014-09-13: qty 162

## 2014-09-13 MED ORDER — NALOXONE HCL 0.4 MG/ML IJ SOLN
INTRAMUSCULAR | Status: AC
  Administered 2014-09-13: 0.4 mg
  Filled 2014-09-13: qty 1

## 2014-09-13 MED ORDER — NALOXONE HCL 0.4 MG/ML IJ SOLN
0.4000 mg | Freq: Once | INTRAMUSCULAR | Status: DC
  Filled 2014-09-13: qty 1

## 2014-09-13 MED ORDER — BISACODYL 10 MG RE SUPP
10.0000 mg | Freq: Once | RECTAL | Status: AC
  Administered 2014-09-13: 10 mg via RECTAL
  Filled 2014-09-13: qty 1

## 2014-09-13 MED ORDER — MORPHINE SULFATE 4 MG/ML IJ SOLN
4.0000 mg | Freq: Once | INTRAMUSCULAR | Status: AC
  Administered 2014-09-13: 4 mg via INTRAVENOUS
  Filled 2014-09-13: qty 1

## 2014-09-13 MED ORDER — SODIUM CHLORIDE 0.9 % IV BOLUS (SEPSIS)
500.0000 mL | Freq: Once | INTRAVENOUS | Status: DC
Start: 2014-09-13 — End: 2014-09-15

## 2014-09-13 MED ORDER — NOREPINEPHRINE BITARTRATE 1 MG/ML IV SOLN
2.0000 ug/min | INTRAVENOUS | Status: DC
  Administered 2014-09-13: 10 ug/min via INTRAVENOUS
  Administered 2014-09-13: 40 ug/min via INTRAVENOUS
  Filled 2014-09-13 (×2): qty 4

## 2014-09-13 MED ORDER — SODIUM CHLORIDE 0.9 % IV SOLN
INTRAVENOUS | Status: DC
  Administered 2014-09-13 – 2014-09-15 (×5): via INTRAVENOUS

## 2014-09-13 NOTE — Progress Notes (Signed)
1824 10/02/2014 nsg Patient to ICU  per bed accompanied by rapid response , RN, MD and family. Report given to receiving RN.

## 2014-09-13 NOTE — Progress Notes (Signed)
eLink Physician-Brief Progress Note Patient Name: Starr Sinclairnnie J Chisolm DOB: 03/10/1928 MRN: [id number]   Date of Service  09/21/2014  HPI/Events of Note  Called for hemorrhagic shock La 3.8, shock, last hgb high but now clinically in shock  eICU Interventions  Moved to ICU stat Bolus 1 unit PRBC Kcentra stat, d/w paharmacy cordi srequired Levo started to map goals     Intervention Category Major Interventions: Hypovolemia - evaluation and treatment with fluids  Nelda BucksFEINSTEIN,DANIEL J. 09/27/2014, 6:42 PM

## 2014-09-13 NOTE — Plan of Care (Signed)
Problem: Phase II Progression Outcomes Goal: O2 sats > equal to 90% on RA or at baseline Outcome: Not Progressing Patient desats with no O2

## 2014-09-13 NOTE — Progress Notes (Signed)
Placed pt. On bipap per Dr. Tyson AliasFeinstein. Pt. Tolerating ok for now.

## 2014-09-13 NOTE — Progress Notes (Signed)
Notified Schorr, NP that pt having rt abdomen pain rating it a 10 and is nauseated. Pt's abdomen has bruises from injections. Pt received Morphine at 0222, not due yet. NP stated that she will put in order for Morphine and zofran. Will continue to monitor pt. Jerrye BushyJenny Thacker,RN

## 2014-09-13 NOTE — Progress Notes (Signed)
Progress Note   Amy Sinclairnnie J Rozar ZOX:[id number] DOB: 05/29/1927 DOA: 08/19/2014 PCP: Gwen PoundsUSSO,[name], MD   Brief Narrative:   Amy Bolton is an 81 y.o. female with a PMH of COPD, mild CAD, PAD, hypertension and stage III-4 chronic kidney disease who was admitted 08/26/2014 with dyspnea and wheeze after failing outpatient treatment with a Z-Pak and a prednisone taper. On initial evaluation, the patient was found to be hypoxic requiring supplemental oxygen.  Assessment/Plan:   Principal Problem: Acute bronchospastic COPD exacerbation - Continue medical therapy: Prednisone which has been tapered to 20 mg daily, Xopenex 3 times a day and antitussives. - Wean oxygen as tolerated. - Completed 6 days of antibiotics with Levaquin discontinued on 09/12/14.  Active problems:  Acute renal failure - Creatinine steadily rising over the course of her admission. - Start IV fluids.  Abdominal pain - Patient reported new onset of severe abdominal pain 09/12/14. - 2 views of the abdomen obtained 10/09/2014: No free air or obstruction. Stool noted throughout colon. - CT abdomen pelvis pending. - Lipase, lactic acid not elevated. Hemoglobin stable making hematoma unlikely. - Dulcolax suppository ordered.  Paroxysmal atrial fibrillation - Newly appreciated on monitor and EKG since admission - no known prior history.  - Rate controlled on Coreg and Cardizem. - Transitioned to Xarelto which was approved by cardiology despite aortic valve disease - rate well controlled at this time.  - TEE with cardioversion planned 09/11/2014.  - TSH low but free T4 only very mildly elevated.  Suppressed TSH - possible hyperthyroidism - Free T4 only slightly elevated at 1.14.  - Will need follow-up in 8 weeks for repeat testing. - Avoid amiodarone.  Chronic diastolic congestive heart failure - Baseline wgt appears to be ~82kg and pt remains at 80.96 - Net negative 5.2L since admit, balanced over the past 24 hours. -  Continue to hold diuretic for now with climbing creatinine and follow trend.    Obesity - Body mass index is 31.63 kg/(m^2). - Continue heart healthy diet.  Hypertension - Blood pressure currently well controlled. Continue Coreg, Cardizem.  Chronic kidney disease stage III - Creatinine has climbed in face of ongoing diuretic. - Felt to be dehydrated at present, diuretics on hold. - Start gentle IV fluids.  GERD/hiatal hernia - Maximize treatment to assure not contributing to frequent cough.  Continue Protonix twice a day.  Hyperlipidemia - Continue Lipitor which was started in the hospital. Cholesterol suboptimally controlled: Cholesterol 309, LDL 248.  Aortic stenosis - Moderate via echo May 2013 - followed by Dr. Elease HashimotoNahser.  Known abdominal aortic aneurysm / peripheral vascular disease - Stable at present.  DVT Prophylaxis - On Xarelto.  Code Status: Full. Family Communication: Daughter, Kendal HymenBonnie, updated at bedside. Disposition Plan: Home when stable after cardioversion, likely 2 days.   IV Access:    Peripheral IV   Procedures and diagnostic studies:   Dg Chest Port 1 View  09/03/2014   CLINICAL DATA:  Productive cough. Shortness of breath. Wheezing. Symptoms started this morning.  EXAM: PORTABLE CHEST - 1 VIEW  COMPARISON:  03/15/2013  FINDINGS: Lower cervical plate and screw fixator. Atherosclerotic calcification of the aortic arch. Heart size within normal limits for projection. Postoperative findings from Mumford procedure on the right.  Borderline central airway thickening. Indistinct pulmonary vasculature is probably technique related. No overt edema.  IMPRESSION: 1. Airway thickening is present, suggesting bronchitis or reactive airways disease.   Electronically Signed   By: Annitta NeedsWalter  Liebkemann M.D.  On: 08/19/2014 13:04   Dg Abd 2 Views  09/18/2014   CLINICAL DATA:  Abdominal pain and nausea for 1 day  EXAM: ABDOMEN - 2 VIEW  COMPARISON:  CT abdomen and pelvis  May 15, 2012  FINDINGS: Supine and left lateral decubitus images obtained. There is fairly diffuse stool throughout colon. There is no bowel dilatation or air-fluid level suggesting obstruction. No free air. Lung bases are clear. There is postoperative change in the lower lumbar spine as well as total hip prosthesis on the right. There are iliac artery stents bilaterally as well.  IMPRESSION: Overall bowel gas pattern unremarkable. No obstruction or free air. Fairly diffuse stool throughout colon.   Electronically Signed   By: Bretta BangWilliam  Woodruff III M.D.   On: August 11, 2014 11:42     Medical Consultants:    Dr. Dietrich PatesPaula Ross, Cardiology  Anti-Infectives:    Doxycycline 4/29  Levaquin 5/1 > 5/4  Subjective:   Amy Bolton is moaning out and reporting severe right lower quadrant abdominal pain and tenderness. She is somewhat lethargic after not sleeping well through the night. She has had a poor appetite.  Objective:    Filed Vitals:   09/12/14 2112 10-31-2014 0516 10-31-2014 0748 10-31-2014 1356  BP: 136/71 130/66  132/67  Pulse: 64 77  81  Temp: 97.5 F (36.4 C) 97.5 F (36.4 C)  97.6 F (36.4 C)  TempSrc: Oral Oral  Axillary  Resp: 18 20  20   Height:      Weight:  80.967 kg (178 lb 8 oz)    SpO2: 98% 90% 94% 99%    Intake/Output Summary (Last 24 hours) at 10-31-2014 1501 Last data filed at 10-31-2014 0900  Gross per 24 hour  Intake    240 ml  Output    200 ml  Net     40 ml    Exam: Gen:  Weak, in moderate distress from abdominal pain Cardiovascular:  HSIR Respiratory:  Lungs diminished with a few scattered rhonchi and wheezes Gastrointestinal:  Abdomen distended, extremely tender RLQ with guarding Extremities:  No C/E/C   Data Reviewed:    Labs: Basic Metabolic Panel:  Recent Labs Lab 09/09/14 0423 09/10/14 0336 09/11/14 0226 09/12/14 0225 10-31-2014 0627  NA 136 138 139 138 137  K 4.8 4.4 4.3 4.0 4.2  CL 99* 99* 93* 91* 92*  CO2 25 27 32 33* 34*  GLUCOSE  121* 107* 124* 124* 178*  BUN 39* 50* 67* 83* 83*  CREATININE 1.40* 1.42* 1.82* 2.10* 1.80*  CALCIUM 8.7* 9.0 8.9 8.4* 8.7*  MG 2.3  --   --  2.2  --   PHOS  --   --   --   --  5.1*   GFR Estimated Creatinine Clearance: 23 mL/min (by C-G formula based on Cr of 1.8). Liver Function Tests:  Recent Labs Lab 08/20/2014 1157 09/08/14 0231 09/12/14 0225 10-31-2014 0627  AST 40* 19 22  --   ALT 19 14 24   --   ALKPHOS 73 76 55  --   BILITOT 1.4* 0.5 0.5  --   PROT 5.9* 6.1 5.5*  --   ALBUMIN 3.3* 3.3* 3.0* 3.0*   CBC:  Recent Labs Lab 08/17/2014 1157 09/08/14 0231 09/09/14 0423 09/10/14 0336 09/12/14 0225 10-31-2014 1215  WBC 12.1* 7.7 11.8* 13.1* 11.6* 14.2*  NEUTROABS 9.7*  --   --   --  10.2*  --   HGB 14.9 14.9 14.7 15.0 15.2* 14.6  HCT 45.8 45.3  44.5 45.5 45.1 43.0  MCV 92.3 91.3 91.8 91.4 90.7 90.1  PLT 269 300 249 273 264 306   Cardiac Enzymes:  Recent Labs Lab 08/22/2014 1157  TROPONINI 0.05*   Lipid Profile:  Recent Labs  09/12/14 0225  CHOL 309*  HDL 42  LDLCALC 248*  TRIG 95  CHOLHDL 7.4   Sepsis Labs:  Recent Labs Lab 08/19/2014 1209  09/09/14 0423 09/10/14 0336 09/12/14 0225 2014/09/26 1215  WBC  --   < > 11.8* 13.1* 11.6* 14.2*  LATICACIDVEN 1.67  --   --   --   --  1.8  < > = values in this interval not displayed. Microbiology Recent Results (from the past 240 hour(s))  MRSA PCR Screening     Status: None   Collection Time: 08/13/2014  4:30 PM  Result Value Ref Range Status   MRSA by PCR NEGATIVE NEGATIVE Final    Comment:        The GeneXpert MRSA Assay (FDA approved for NASAL specimens only), is one component of a comprehensive MRSA colonization surveillance program. It is not intended to diagnose MRSA infection nor to guide or monitor treatment for MRSA infections.      Medications:   . allopurinol  300 mg Oral Daily  . ALPRAZolam  0.25 mg Oral QHS  . antiseptic oral rinse  7 mL Mouth Rinse BID  . atorvastatin  20 mg Oral q1800   . carvedilol  25 mg Oral BID WC  . cholecalciferol  2,000 Units Oral Daily  . diltiazem  30 mg Oral 4 times per day  . fluticasone  2 spray Each Nare QHS  . levalbuterol  0.63 mg Nebulization TID  . mirtazapine  30 mg Oral QHS  . omega-3 acid ethyl esters  1 g Oral Daily  . pantoprazole  40 mg Oral BID  . predniSONE  20 mg Oral Q breakfast  . rivaroxaban  15 mg Oral Q supper   Continuous Infusions:   Time spent: 35 minutes.  The patient is medically complex and requires high complexity decision making, coordination of care with cardiology.    LOS: 6 days   RAMA,CHRISTINA  [facility] Pager [phone]. If unable to reach me by pager, please call my cell phone at [phone].  *Please refer to [email or web address], password [email or web address] to get updated schedule on who will round on this patient, as hospitalists switch teams weekly. If 7PM-7AM, please contact night-coverage at [email or web address], password [email or web address] for any overnight needs.  2014-09-26, 3:01 PM

## 2014-09-13 NOTE — Progress Notes (Signed)
eLink Physician-Brief Progress Note Patient Name: Amy Bolton DOB: 08/02/1927 MRN: [id number]   Date of Service  09/21/2014  HPI/Events of Note  lerhargic int, abg ph 7.278 likley from la compenaation and abdo distention  eICU Interventions  She is awake, following commmands, no N/V now BIPAP, s  abg to follow, pcxr now     Intervention Category Major Interventions: Respiratory failure - evaluation and management  FEINSTEIN,DANIEL J. 09/29/2014, 10:14 PM

## 2014-09-13 NOTE — Significant Event (Signed)
Rapid Response Event Note  Overview:  Called to see patient with syncopal episode after nausea and vomiting Time Called: 1758 Arrival Time: 1805 Event Type: Hypotension  Initial Focused Assessment:  On arrival arouses to name - answers simple questions but very lethargic.  Denies pain at this time.  Skin cool and dry.  BP 59/37 after syncope per RN Angelina report.  RN on phone with Dr. Darnelle Catalanama.  NS bolus started left PIV.  Monitor shows SR.  Abd large firm - tender to touch - moans.  Resps reg and unlabored - shallow.  BIl BS clear.     Interventions:  NS bolus at wide open.  Repeat manual BP 62/40.  Placed on monitor for transfer.  Placed on NRB mask.  Joneen RoachPaul Hoffman NP present.  Stat transfer to 2M07.  Had brief period of HR block to 43 with nausea.  Transferred to bed - repeat BP 86/60  HR 84 - [facility].   Stat order for emergency release blood - obtained from blood bank.  Family updated.     Event Summary: Name of Physician Notified: Dr. Darnelle Catalanama at  (pta RRT)  Name of Consulting Physician Notified: Dr. Vassie LollAlva    at  (by Dr. Darnelle Catalanama)  Outcome: Transferred (Comment)  Event End Time: [id number]  Delton PrairieBritt, Deanndra Kirley L

## 2014-09-13 NOTE — Progress Notes (Signed)
Pt. Felt as if she was going to vomit and pulled her mask off. RN assisted by placing pt. Back on nasal cannula.

## 2014-09-13 NOTE — Progress Notes (Signed)
PT Cancellation Note  Patient Details Name: Darcus Austinnnie J Gehlhausen MRN: [id number] DOB: 01/26/1928   Cancelled Treatment:    Reason Eval/Treat Not Completed: Pain limiting ability to participate Pt declined participating in therapy evaluation this PM secondary to abdominal pain and not feeling well. Will follow up next available time.  Alvie HeidelbergFolan, Keoni Risinger A 09/12/2014, 2:15 PM  Mylo RedShauna Khaliah Barnick, PT, DPT [phone]

## 2014-09-13 NOTE — Progress Notes (Signed)
1600 09/12/2014 nsg NT called RN to room re: patient wants to use BSC. Pt had  blank stare went unresponsive for 1-2 seconds placed patient back to bed called for help. MD notified. Prior to incident patient keeps on c/o pain right lower quadrant pain ranging from 6-10 pain scale unrelieved. Pt received pain med. New orders noted. Bolus of NS given. Lastest BP 104/57 HR 72. Stepdown ordered, Continued to monitor patient.

## 2014-09-13 NOTE — Care Management Note (Unsigned)
    Page 1 of 1   09/26/2014     11:34:15 AM CARE MANAGEMENT NOTE 10/06/2014  Patient:  Amy Bolton,Amy Bolton   Account Number:  [id number]  Date Initiated:  Mar 31, 2015  Documentation initiated by:  Lawerance SabalSWIST,DEBBIE  Subjective/Objective Assessment:   COPD, EC Cardioversiion, new Afib     Action/Plan:   will follow for any needs   Anticipated DC Date:  09/17/2014   Anticipated DC Plan:  HOME W HOME HEALTH SERVICES      DC Planning Services  CM consult      Choice offered to / List presented to:             Status of service:  In process, will continue to follow Medicare Important Message given?  YES (If response is "NO", the following Medicare IM given date fields will be blank) Date Medicare IM given:  Mar 31, 2015 Medicare IM given by:  Lawerance SabalSWIST,DEBBIE Date Additional Medicare IM given:   Additional Medicare IM given by:    Discharge Disposition:    Per UR Regulation:    If discussed at Long Length of Stay Meetings, dates discussed:    Comments:  09/09/2014 [place], will follow for any dc needs. Lawerance Sabalebbie Swist RN BSN CM

## 2014-09-13 NOTE — Progress Notes (Signed)
CRITICAL VALUE ALERT  Critical value received:  Lactic Acid 2.9  Date of notification:  09/30/2014  Time of notification:  20:30  Critical value read back:Yes.    Nurse who received alert:  BShepherd, RN  MD notified (1st page):  Joneen RoachPaul Hoffman, NP  Time of first page:  20:45  MD notified (2nd page):  Time of second page:  Responding MD:  Joneen RoachPaul Hoffman, NP  Time MD responded:  20:45

## 2014-09-13 NOTE — Procedures (Signed)
Arterial Catheter Insertion Procedure Note Jamylah Marinaccio Altier [id number] 09-03-1927  Procedure: Insertion of Arterial Catheter  Indications: Blood pressure monitoring  Procedure Details Consent: Risks of procedure as well as the alternatives and risks of each were explained to the (patient/caregiver).  Consent for procedure obtained. Time Out: Verified patient identification, verified procedure, site/side was marked, verified correct patient position, special equipment/implants available, medications/allergies/relevent history reviewed, required imaging and test results available.  Performed  Maximum sterile technique was used including antiseptics, cap, gloves, gown, hand hygiene, mask and sheet. Skin prep: Chlorhexidine; local anesthetic administered 20 gauge catheter was inserted into left radial artery using the Seldinger technique.  Evaluation Blood flow good; BP tracing good. Complications: No apparent complications.   RT placed arterial line into pt. Left radial artery per NP Georgann Housekeeper. Sterile procedures were met, no apparent complications.   Marlowe Aschoff 10/09/2014

## 2014-09-13 NOTE — Progress Notes (Signed)
Cardiology Progress Note  Subjective: Patient desats with no O2. Significant abdominal pain overnight, XR unremarkable, CT pending.  Objective: Vital signs in last 24 hours: Filed Vitals:   09/12/14 2004 09/12/14 2112 09/22/2014 0516 09/25/2014 0748  BP:  136/71 130/66   Pulse:  64 77   Temp:  97.5 F (36.4 C) 97.5 F (36.4 C)   TempSrc:  Oral Oral   Resp:  18 20   Height:      Weight:   178 lb 8 oz (80.967 kg)   SpO2: 95% 98% 90% 94%   Weight change: 0 lb (0 kg)  Intake/Output Summary (Last 24 hours) at 09/22/2014 1223 Last data filed at 09/21/2014 0900  Gross per 24 hour  Intake    480 ml  Output    400 ml  Net     80 ml   General: sitting up in bed in NAD, watching tv; pleasant, conversant, cooperative with exam HEENT: [place]/AT, no JVD Cardiac: irregular rhythm; 2/6 mid peaking systolic murmur (heard best at RUSB), no rubs or gallops Pulm: few wheezes with some rhonchi (much improved from yesterday's exam), moving normal volumes of air; SpO2 mid 90s on 5L via [place] Abd: + BS, very tender to palpation RLQ, possible hematoma noted (difficult to tell secondary to tenderness).  Ext: warm and well perfused, no pedal edema Neuro: alert and oriented X3, responding appropriately, moaning, mild distress, moves extremities spontaneously  Lab Results: Basic Metabolic Panel:  Recent Labs Lab 09/09/14 0423  09/12/14 0225 09/29/2014 0627  NA 136  < > 138 137  K 4.8  < > 4.0 4.2  CL 99*  < > 91* 92*  CO2 25  < > 33* 34*  GLUCOSE 121*  < > 124* 178*  BUN 39*  < > 83* 83*  CREATININE 1.40*  < > 2.10* 1.80*  CALCIUM 8.7*  < > 8.4* 8.7*  MG 2.3  --  2.2  --   PHOS  --   --   --  5.1*  < > = values in this interval not displayed. Liver Function Tests:  Recent Labs Lab 09/08/14 0231 09/12/14 0225 09/27/2014 0627  AST 19 22  --   ALT 14 24  --   ALKPHOS 76 55  --   BILITOT 0.5 0.5  --   PROT 6.1 5.5*  --   ALBUMIN 3.3* 3.0* 3.0*   CBC:  Recent Labs Lab Jan 24, 2015 1157   09/10/14 0336 09/12/14 0225  WBC 12.1*  < > 13.1* 11.6*  NEUTROABS 9.7*  --   --  10.2*  HGB 14.9  < > 15.0 15.2*  HCT 45.8  < > 45.5 45.1  MCV 92.3  < > 91.4 90.7  PLT 269  < > 273 264  < > = values in this interval not displayed.  Fasting Lipid Panel:  Recent Labs Lab 09/12/14 0225  CHOL 309*  HDL 42  LDLCALC 248*  TRIG 95  CHOLHDL 7.4   Medications: I have reviewed the patient's current medications. Scheduled Meds: . allopurinol  300 mg Oral Daily  . ALPRAZolam  0.25 mg Oral QHS  . antiseptic oral rinse  7 mL Mouth Rinse BID  . atorvastatin  20 mg Oral q1800  . bisacodyl  10 mg Rectal Once  . carvedilol  25 mg Oral BID WC  . cholecalciferol  2,000 Units Oral Daily  . diltiazem  30 mg Oral 4 times per day  . fluticasone  2 spray Each  Nare QHS  . iohexol  25 mL Oral Q1 Hr x 2  . levalbuterol  0.63 mg Nebulization TID  . mirtazapine  30 mg Oral QHS  . omega-3 acid ethyl esters  1 g Oral Daily  . pantoprazole  40 mg Oral BID  . predniSONE  20 mg Oral Q breakfast  . rivaroxaban  15 mg Oral Q supper   Continuous Infusions:   PRN Meds:.acetaminophen **OR** acetaminophen, benzonatate, guaiFENesin-dextromethorphan, morphine injection, ondansetron   Telemetry:  Irregular, HR 90s 2D ECHO 09/11/14:  LV EF 60-65%, moderate LVH, normal WM, mild AR  Assessment/Plan:  New onset atrial fibrillation:  Rate controlled on po diltiazem and Coreg.  CHADsVasc score is 6 (moderate - high risk of CVA).  She has agreed to A/C with a NOAC.  Tentative TEE date September 07, 2014).    Acute on chronic diastolic HF:  2D ECHO completed 09/11/14 reveals LV EF 60-65%, moderate LVH, normal WM, mild AR.    Aortic stenosis:  Likely not a major contributor to her afib.  In 2014, valve area was 0.84cm^2 and peak gradient was 21mmHg.  09/11/14 ECHO - valve area is 1.51cm^2, mean gradient 16mmHg.  HTN:  Monitor closely with addition of medications for HR control.  Hyperthyroidism:  Suppressed TSH (0.190)  and elevated free T4 (1.14).  Will proceed with TEE-guided cardioversion since her Afib is likely multifactorial in the setting of COPD, HF, hyperthyroidism. Would avoid amiodarone in this patient going forward.  Abdominal pain: I suspect she has developed a spontaneous abdominal bleed. CT ordered. Check f/u Hgb/Hct this pm. We will not be able to cardiovert her if she can't be anticoagulated.   Plan: She is on the schedule for TEE CV tomorrow but will f/u CT this pm.    Corine ShelterLUKE KILROY PA-C 09/27/2014 12:26 PM I have seen and examined the patient along with Corine ShelterLUKE KILROY PA-C.  I have reviewed the chart, notes and new data.  I agree with PA's note.  Key new complaints: RLQ pain Key examination changes: RLQ tenderness and swelling Key new findings / data: CT shows rectus hematoma, no true drop in H/H  PLAN: Hold all anticoagulants. Will cancel cardioversion until we can safely resume anticoagulants. She is well rate controlled.  Thurmon FairMihai Marlowe Cinquemani, MD, [facility] [phone] 09/09/2014, 4:55 PM

## 2014-09-13 NOTE — Procedures (Signed)
Cordis Insertion Procedure Note Amy Bolton [id number] 02/23/1928  Procedure: Insertion of Central Venous Catheter Indications: Drug and/or fluid administration and Frequent blood sampling  Procedure Details Consent: Risks of procedure as well as the alternatives and risks of each were explained to the (patient/caregiver).  Consent for procedure obtained. Time Out: Verified patient identification, verified procedure, site/side was marked, verified correct patient position, special equipment/implants available, medications/allergies/relevent history reviewed, required imaging and test results available.  Performed  Maximum sterile technique was used including antiseptics, cap, gloves, gown, hand hygiene, mask and sheet. Skin prep: Chlorhexidine; local anesthetic administered A antimicrobial bonded/coated single lumen catheter was placed in the right femoral vein due to emergent situation using the Seldinger technique. Central venous catheter advanced through cordis port Ultrasound guidance used.Yes.   Blood aspirated via all 4 ports and then flushed x 4. Line sutured x 2 and dressing applied.  Evaluation Blood flow good Complications: No apparent complications Patient did tolerate procedure well.  Joneen RoachPaul Damaso Laday, AGACNP-BC [facility] Pager [phone] or [phone]  09/10/2014 7:35 PM

## 2014-09-13 NOTE — Consult Note (Signed)
PULMONARY / CRITICAL CARE MEDICINE   Name: Amy Bolton MRN: [id number] DOB: 04/10/1928    ADMISSION DATE:  09/01/2014 CONSULTATION DATE:  09/12/2014  REFERRING MD :  Darnelle CatalanAMA  CHIEF COMPLAINT:  hypotension  INITIAL PRESENTATION: 81 year old female admitted 4/29 for COPD exacerbation and AonC CHF. Developed new onset AF 5/1 and was started on anticoagulation pending DCCV. 5/5 c/o abd pain. CT abd showed large right rectus abdominus hematoma with a hematocrit effect. Later 5/5 became syncopal with hypotension. PCCM to see.   STUDIES:  CT abd 5/5 > Large right rectus abdominus hematoma with a hematocrit effect leading to a fluid fluid level.  SIGNIFICANT EVENTS: 4/29 admitted 5/1 new onset AF 5/5 abd pain > rectus abdominus hematoma > decompensation > to ICU   HISTORY OF PRESENT ILLNESS:  81 y.o. female with a PMH of COPD, mild CAD, PAD, hypertension and stage III-4 chronic kidney disease who was admitted 08/10/2014 for dyspnea + wheeze, treated as COPD flare, developed AKI & A fibn - treated with heparin , changed to xarelto, developed abd pain today, Ct showed abd hematoma Transferred to ICU for shock  PAST MEDICAL HISTORY :   has a past medical history of Atypical chest pain; Palpitation; SOB (shortness of breath); Hypertensive urgency; HTN (hypertension); Asthma; Kidney disease; Anxiety; Constipation; GERD (gastroesophageal reflux disease); Hiatal hernia; Hyperlipidemia; Osteoarthritis; CHF (congestive heart failure); PVD (peripheral vascular disease); AAA (abdominal aortic aneurysm); and Syncope and collapse.  has past surgical history that includes us echocardiography (10-12-08); Cardiovascular stress test (04-19-2008); Tonsillectomy; Adenoidectomy; Spine surgery; Bladder suspension; Cardiac catheterization (01-2006); Back surgery; Iliac artery stent (03/07/2012); Tonsillectomy; Appendectomy; Cholecystectomy; Eye surgery (Bilateral); Abdominal hysterectomy; Colonoscopy w/ biopsies and  polypectomy; Total hip arthroplasty (Right, 03/22/2013); Total hip arthroplasty (Right, 03/22/2013); and Total hip arthroplasty (Right). Prior to Admission medications   Medication Sig Start Date End Date Taking? Authorizing Provider  allopurinol (ZYLOPRIM) 300 MG tablet Take 300 mg by mouth daily.  11/02/13  Yes Historical Provider, MD  azithromycin (ZITHROMAX) 250 MG tablet Take 250-500 mg by mouth daily. Take 2 tablets today, then 1 tablet daily for 4 days   Yes Historical Provider, MD  carvedilol (COREG) 25 MG tablet Take 1 tablet (25 mg total) by mouth 2 (two) times daily with a meal.   Hold SBP < 105 12/11/11  Yes Creola CornJohn Russo, MD  cholecalciferol (VITAMIN D) 1000 UNITS tablet Take 2,000 Units by mouth daily.    Yes Historical Provider, MD  fluticasone (FLONASE) 50 MCG/ACT nasal spray Place 2 sprays into the nose at bedtime.   Yes Historical Provider, MD  furosemide (LASIX) 20 MG tablet Take 40 mg by mouth as needed.  05/05/13  Yes Historical Provider, MD  lisinopril (PRINIVIL,ZESTRIL) 20 MG tablet Take 20 mg by mouth daily.   Yes Historical Provider, MD  mirtazapine (REMERON) 30 MG tablet Take 30 mg by mouth at bedtime.   Yes Historical Provider, MD  pantoprazole (PROTONIX) 40 MG tablet Take 40 mg by mouth as needed.  10/14/12  Yes Historical Provider, MD  predniSONE (DELTASONE) 20 MG tablet Take 40 mg by mouth daily with breakfast. For 5 days   Yes Historical Provider, MD  Umeclidinium-Vilanterol 62.5-25 MCG/INH AEPB Inhale 1 puff into the lungs daily as needed.   Yes Historical Provider, MD  VENTOLIN HFA 108 (90 BASE) MCG/ACT inhaler  10/05/13  Yes Historical Provider, MD  ALPRAZolam (XANAX) 0.25 MG tablet Take 0.25 mg by mouth at bedtime. For sleep 11/13/11   Historical Provider,  MD  azithromycin (ZITHROMAX) 250 MG tablet Take 250 mg by mouth daily. zpak    Historical Provider, MD  Omega-3 Fatty Acids (FISH OIL) 1000 MG CAPS Take 1 capsule by mouth daily.    Historical Provider, MD   oxyCODONE-acetaminophen (PERCOCET/ROXICET) 5-325 MG per tablet Take 1-2 tablets by mouth every 6 (six) hours as needed for moderate pain. Patient not taking: Reported on 08/20/2014 03/24/13   Marshia LyJames Bethune, PA-C   Allergies  Allergen Reactions  . Ampicillin Other (See Comments)    Caused colitis  . Other Itching    Eye cream -doesn't know name  . Penicillins Other (See Comments)    Caused colitis  . Sulfa Antibiotics Diarrhea and Other (See Comments)    "muscles drew up"    FAMILY HISTORY:  indicated that her mother is deceased.  SOCIAL HISTORY:  reports that she has never smoked. She has never used smokeless tobacco. She reports that she does not drink alcohol or use illicit drugs.  REVIEW OF SYSTEMS:  Unable to obtain sinc ein extremis  SUBJECTIVE:   VITAL SIGNS: Temp:  [95.6 F (35.3 C)-97.6 F (36.4 C)] 96.6 F (35.9 C) (05/05 1901) Pulse Rate:  [64-98] 82 (05/05 1900) Resp:  [17-20] 17 (05/05 1901) BP: (59-136)/(37-104) 119/104 mmHg (05/05 1901) SpO2:  [90 %-100 %] 100 % (05/05 1900) Weight:  [80.967 kg (178 lb 8 oz)] 80.967 kg (178 lb 8 oz) (05/05 0516) HEMODYNAMICS:   VENTILATOR SETTINGS:   INTAKE / OUTPUT:  Intake/Output Summary (Last 24 hours) at 09/12/2014 1935 Last data filed at 09/18/2014 1900  Gross per 24 hour  Intake    435 ml  Output    200 ml  Net    235 ml    PHYSICAL EXAMINATION: General:  Acutely ill Neuro:  Non focal, awake, interactive HEENT:  No jvd, pallor + Cardiovascular:  s1s2 tachy Lungs:  clear Abdomen:  Soft, distended, tender, ecchymosis + Musculoskeletal:  Pulses weak, no dema Skin:  cool  LABS:  CBC  Recent Labs Lab 09/10/14 0336 09/12/14 0225 10/02/2014 1215 09/21/2014 1630  WBC 13.1* 11.6* 14.2*  --   HGB 15.0 15.2* 14.6 13.5  HCT 45.5 45.1 43.0 40.6  PLT 273 264 306  --    Coag's No results for input(s): APTT, INR in the last 168 hours. BMET  Recent Labs Lab 09/11/14 0226 09/12/14 0225 09/24/2014 0627  NA 139  138 137  K 4.3 4.0 4.2  CL 93* 91* 92*  CO2 32 33* 34*  BUN 67* 83* 83*  CREATININE 1.82* 2.10* 1.80*  GLUCOSE 124* 124* 178*   Electrolytes  Recent Labs Lab 09/09/14 0423  09/11/14 0226 09/12/14 0225 10/03/2014 0627  CALCIUM 8.7*  < > 8.9 8.4* 8.7*  MG 2.3  --   --  2.2  --   PHOS  --   --   --   --  5.1*  < > = values in this interval not displayed. Sepsis Markers  Recent Labs Lab 08/18/2014 1209 09/12/2014 1215 09/10/2014 1630  LATICACIDVEN 1.67 1.8 3.8*   ABG  Recent Labs Lab 08/27/2014 1710  PHART 7.419  PCO2ART 38.9  PO2ART 98.5   Liver Enzymes  Recent Labs Lab 08/13/2014 1157 09/08/14 0231 09/12/14 0225 09/28/2014 0627  AST 40* 19 22  --   ALT 19 14 24   --   ALKPHOS 73 76 55  --   BILITOT 1.4* 0.5 0.5  --   ALBUMIN 3.3* 3.3* 3.0* 3.0*  Cardiac Enzymes  Recent Labs Lab 09-28-14 1157  TROPONINI 0.05*   Glucose No results for input(s): GLUCAP in the last 168 hours.  Imaging No results found.   ASSESSMENT / PLAN:  PULMONARY OETT  A: 'COPD ' P:   xopenex nebs IV solumedrol 40 q 8h  CARDIOVASCULAR CVL 5/5 lt fem >> A:  Shock, suspect hemorrhagic - lactic acidosis Atrial fibrillation Acute on chronic diastolic CHF  P:  1 U PRBC NS bolus Levophed gtt -may switch to neo gtt if tachy A-line if able Dc coreg & cardizem -use lopressor prn, defer DCCV  RENAL A:   AKI  P:   Fluids , cr improving  GASTROINTESTINAL A:  No issues P:   npo  HEMATOLOGIC A:   Large rectus abdominus hematoma Acute blood loss anemia  P:  Serial Hb q 6h  [place]  INFECTIOUS A:  No issues P:     ENDOCRINE A:  No isses   P:   CBG while npo  NEUROLOGIC A:  No issues P:   RASS goal: 0    FAMILY  - Updates: none at bedside  - Inter-disciplinary family meet or Palliative Care meeting due by:  day 7    TODAY'S SUMMARY: Hemorrhagic shock from large rectus sheath hematoma, while on xarelto - supportive care   The patient is critically  ill with multiple organ systems failure and requires high complexity decision making for assessment and support, frequent evaluation and titration of therapies, application of advanced monitoring technologies and extensive interpretation of multiple databases. Critical Care Time devoted to patient care services described in this note independent of APP time is 50 minutes.    Cyril Mourning MD. Tonny Bollman. [facility] Pager [phone] If no response call [phone]   10/03/2014, 7:35 PM

## 2014-09-13 NOTE — Progress Notes (Signed)
Called by RN about serum lactate increase to 3.8.  Asked her to check the patient's vital signs.  Patient now with recurrent hypotension.  Spoke with Dr. Tyson AliasFeinstein about need for transfer to ICU.  He will assume the patient's care.  Karey Suthers 10/02/2014 6:12 PM

## 2014-09-14 ENCOUNTER — Encounter (HOSPITAL_COMMUNITY): Admission: EM | Disposition: E | Payer: Self-pay | Source: Home / Self Care | Attending: Internal Medicine

## 2014-09-14 ENCOUNTER — Ambulatory Visit (HOSPITAL_COMMUNITY): Payer: Medicare Other

## 2014-09-14 LAB — BASIC METABOLIC PANEL
Anion gap: 9 (ref 5–15)
BUN: 82 mg/dL — AB (ref 6–20)
CO2: 20 mmol/L — ABNORMAL LOW (ref 22–32)
Calcium: 5.8 mg/dL — CL (ref 8.9–10.3)
Chloride: 108 mmol/L (ref 101–111)
Creatinine, Ser: 2.39 mg/dL — ABNORMAL HIGH (ref 0.44–1.00)
GFR calc Af Amer: 20 mL/min — ABNORMAL LOW (ref >60)
GFR calc non Af Amer: 17 mL/min — ABNORMAL LOW (ref >60)
GLUCOSE: 169 mg/dL — AB (ref 70–99)
Potassium: 4.1 mmol/L (ref 3.5–5.1)
Sodium: 137 mmol/L (ref 135–145)

## 2014-09-14 LAB — CBC
HEMATOCRIT: 22 % — AB (ref 36.0–46.0)
HEMATOCRIT: 23.6 % — AB (ref 36.0–46.0)
HEMOGLOBIN: 7.5 g/dL — AB (ref 12.0–15.0)
Hemoglobin: 8.1 g/dL — ABNORMAL LOW (ref 12.0–15.0)
MCH: 31.1 pg (ref 26.0–34.0)
MCH: 31.4 pg (ref 26.0–34.0)
MCHC: 34.1 g/dL (ref 30.0–36.0)
MCHC: 34.3 g/dL (ref 30.0–36.0)
MCV: 91.3 fL (ref 78.0–100.0)
MCV: 91.5 fL (ref 78.0–100.0)
Platelets: 159 10*3/uL (ref 150–400)
Platelets: 167 10*3/uL (ref 150–400)
RBC: 2.41 MIL/uL — ABNORMAL LOW (ref 3.87–5.11)
RBC: 2.58 MIL/uL — ABNORMAL LOW (ref 3.87–5.11)
RDW: 15.1 % (ref 11.5–15.5)
RDW: 15.1 % (ref 11.5–15.5)
WBC: 60.2 10*3/uL — AB (ref 4.0–10.5)
WBC: 62.3 10*3/uL — AB (ref 4.0–10.5)

## 2014-09-14 LAB — LACTIC ACID, PLASMA
LACTIC ACID, VENOUS: 4.3 mmol/L — AB (ref 0.5–2.0)
Lactic Acid, Venous: 3 mmol/L (ref 0.5–2.0)

## 2014-09-14 LAB — HEMOGLOBIN AND HEMATOCRIT, BLOOD
HEMATOCRIT: 23.7 % — AB (ref 36.0–46.0)
HEMATOCRIT: 25.8 % — AB (ref 36.0–46.0)
HEMOGLOBIN: 8 g/dL — AB (ref 12.0–15.0)
Hemoglobin: 9 g/dL — ABNORMAL LOW (ref 12.0–15.0)

## 2014-09-14 LAB — PROCALCITONIN: Procalcitonin: 1.26 ng/mL

## 2014-09-14 LAB — PREPARE RBC (CROSSMATCH)

## 2014-09-14 LAB — APTT: APTT: 37 s (ref 24–37)

## 2014-09-14 LAB — GLUCOSE, CAPILLARY: Glucose-Capillary: 223 mg/dL — ABNORMAL HIGH (ref 70–99)

## 2014-09-14 SURGERY — ECHOCARDIOGRAM, TRANSESOPHAGEAL
Anesthesia: Monitor Anesthesia Care

## 2014-09-14 MED ORDER — VANCOMYCIN HCL 10 G IV SOLR
1500.0000 mg | INTRAVENOUS | Status: AC
  Administered 2014-09-14: 1500 mg via INTRAVENOUS
  Filled 2014-09-14: qty 1500

## 2014-09-14 MED ORDER — VASOPRESSIN 20 UNIT/ML IV SOLN
0.0300 [IU]/min | INTRAVENOUS | Status: DC
  Administered 2014-09-14 – 2014-09-15 (×2): 0.03 [IU]/min via INTRAVENOUS
  Filled 2014-09-14 (×3): qty 2

## 2014-09-14 MED ORDER — PANTOPRAZOLE SODIUM 40 MG IV SOLR
40.0000 mg | INTRAVENOUS | Status: DC
  Administered 2014-09-15: 40 mg via INTRAVENOUS
  Filled 2014-09-14: qty 40

## 2014-09-14 MED ORDER — CHLORHEXIDINE GLUCONATE 0.12 % MT SOLN
15.0000 mL | Freq: Two times a day (BID) | OROMUCOSAL | Status: DC
  Administered 2014-09-14 – 2014-09-15 (×3): 15 mL via OROMUCOSAL
  Filled 2014-09-14 (×3): qty 15

## 2014-09-14 MED ORDER — SODIUM CHLORIDE 0.9 % IV BOLUS (SEPSIS)
1000.0000 mL | Freq: Once | INTRAVENOUS | Status: AC
  Administered 2014-09-14: 1000 mL via INTRAVENOUS

## 2014-09-14 MED ORDER — LEVALBUTEROL HCL 0.63 MG/3ML IN NEBU
0.6300 mg | INHALATION_SOLUTION | Freq: Four times a day (QID) | RESPIRATORY_TRACT | Status: DC
  Administered 2014-09-14 – 2014-09-15 (×2): 0.63 mg via RESPIRATORY_TRACT
  Filled 2014-09-14 (×5): qty 3

## 2014-09-14 MED ORDER — VANCOMYCIN HCL IN DEXTROSE 1-5 GM/200ML-% IV SOLN: 1000.0000 mg | INTRAVENOUS | Status: DC

## 2014-09-14 MED ORDER — PIPERACILLIN-TAZOBACTAM IN DEX 2-0.25 GM/50ML IV SOLN
2.2500 g | Freq: Four times a day (QID) | INTRAVENOUS | Status: DC
  Administered 2014-09-14 – 2014-09-15 (×4): 2.25 g via INTRAVENOUS
  Filled 2014-09-14 (×8): qty 50

## 2014-09-14 MED ORDER — SODIUM CHLORIDE 0.9 % IV SOLN
Freq: Once | INTRAVENOUS | Status: AC
  Administered 2014-09-14: 10:00:00 via INTRAVENOUS

## 2014-09-14 MED ORDER — CETYLPYRIDINIUM CHLORIDE 0.05 % MT LIQD
7.0000 mL | Freq: Two times a day (BID) | OROMUCOSAL | Status: DC
  Administered 2014-09-14 – 2014-09-15 (×2): 7 mL via OROMUCOSAL

## 2014-09-14 NOTE — Progress Notes (Signed)
PULMONARY / CRITICAL CARE MEDICINE   Name: Amy Bolton MRN: [id number] DOB: 12/02/1927    ADMISSION DATE:  08/20/2014 CONSULTATION DATE:  09/11/2014  REFERRING MD :  Darnelle CatalanAMA  CHIEF COMPLAINT:  hypotension  INITIAL PRESENTATION:  81 yo female admitted 4/29 with AECOPD and CHF exacerbation.  She developed A fib and started on xarelto.  She developed abdominal pain and found to have Rt rectus abdominus hematoma with hemorrhagic shock.  STUDIES:  CT abd 5/5 > Large right rectus abdominus hematoma with a hematocrit effect leading to a fluid fluid level.  SIGNIFICANT EVENTS: 4/29 admitted 5/01 new onset AF 5/05 abd pain > rectus abdominus hematoma > decompensation > to ICU  SUBJECTIVE:  C/o abdominal pain.  Denies chest pain or dyspnea.  VITAL SIGNS: Temp:  [92.7 F (33.7 C)-97.6 F (36.4 C)] 95.5 F (35.3 C) (05/06 0600) Pulse Rate:  [32-98] 72 (05/06 0730) Resp:  [9-28] 14 (05/06 0830) BP: (35-132)/(11-104) 57/42 mmHg (05/06 0830) SpO2:  [81 %-100 %] 87 % (05/06 0730) Arterial Line BP: (39-107)/(23-45) 43/24 mmHg (05/06 0830) FiO2 (%):  [40 %] 40 % (05/05 2309) Weight:  [193 lb 3.2 oz (87.635 kg)] 193 lb 3.2 oz (87.635 kg) (05/06 0500) INTAKE / OUTPUT:  Intake/Output Summary (Last 24 hours) at 10/02/2014 0851 Last data filed at 09/16/2014 0800  Gross per 24 hour  Intake 5085.8 ml  Output     20 ml  Net 5065.8 ml    PHYSICAL EXAMINATION: General: pale Neuro: somnolent, follows commands, able to state her location and name HEENT: pale sclerea Cardiovascular: regular, bradycardic Lungs: no wheeze Abdomen: tender on light palpation in RUQ and RLQ Musculoskeletal: no edema Skin:  cool  LABS:  CBC  Recent Labs Lab 09/10/2014 1934 10/09/2014 0547 10/04/2014 0654  WBC 15.6* 60.2* 62.3*  HGB 12.3 7.5* 8.1*  HCT 37.2 22.0* 23.6*  PLT 241 159 167   Coag's  Recent Labs Lab 09/09/2014 1934  APTT 28  INR 1.48   BMET  Recent Labs Lab 09/17/2014 0627 09/19/2014 1934  10/09/2014 0547  NA 137 135 137  K 4.2 4.4 4.1  CL 92* 93* 108  CO2 34* 32 20*  BUN 83* 85* 82*  CREATININE 1.80* 2.24* 2.39*  GLUCOSE 178* 217* 169*   Electrolytes  Recent Labs Lab 09/09/14 0423  09/12/14 0225 10/02/2014 0627 09/18/2014 1934 09/10/2014 0547  CALCIUM 8.7*  < > 8.4* 8.7* 7.3* 5.8*  MG 2.3  --  2.2  --   --   --   PHOS  --   --   --  5.1*  --   --   < > = values in this interval not displayed. Sepsis Markers  Recent Labs Lab 09/25/2014 1215 09/16/2014 1630 10/01/2014 1933  LATICACIDVEN 1.8 3.8* 2.9*   ABG  Recent Labs Lab 08/29/2014 1710 09/12/2014 2207 09/17/2014 2325  PHART 7.419 7.282* 7.341*  PCO2ART 38.9 59.2* 47.7*  PO2ART 98.5 61.0* 89.0   Liver Enzymes  Recent Labs Lab 09/08/14 0231 09/12/14 0225 09/17/2014 0627 09/26/2014 1934  AST 19 22  --  26  ALT 14 24  --  24  ALKPHOS 76 55  --  38  BILITOT 0.5 0.5  --  0.7  ALBUMIN 3.3* 3.0* 3.0* 2.2*   Cardiac Enzymes  Recent Labs Lab 09/01/2014 1157 09/12/2014 1934  TROPONINI 0.05* <0.03   Glucose No results for input(s): GLUCAP in the last 168 hours.  Imaging Ct Abdomen Pelvis Wo Contrast  10/03/2014  CLINICAL DATA:  Right lower quadrant pain and nausea since last night.  EXAM: CT ABDOMEN AND PELVIS WITHOUT CONTRAST  TECHNIQUE: Multidetector CT imaging of the abdomen and pelvis was performed following the standard protocol without IV contrast.  COMPARISON:  05/15/2012  FINDINGS: There is a large right rectus abdominus hematoma measuring 13 cm in length by 6.5 cm x 13 cm transversely. A fluid fluid level lies within this. Hematoma displaces the posterior aspect of the right rectus abdominus muscle posteriorly where it indents the peritoneal cavity.  There is reticular opacity and mild peribronchial thickening in the lower lobes with additional patchy dependent opacity, left lower lobe greater than right lower lobe. This is likely atelectasis. No convincing pneumonia and no pulmonary edema. Small hiatal hernia.   Liver, spleen, pancreas and adrenal glands: Unremarkable. Gallbladder surgically absent. No bile duct dilation.  Mild bilateral renal cortical thinning. No renal masses. No hydronephrosis. Ureters normal course and in caliber. Bladder partly obscured from artifact from a right hip prosthesis but otherwise unremarkable.  Uterus surgically absent.  No pelvic masses.  Atherosclerotic calcifications noted throughout the abdominal aorta extending into the iliac vessels. Small focal dilation of the infrarenal abdominal aorta with a maximum AP diameter of 2.2 cm.  No adenopathy.  No ascites.  Several left colon, mostly sigmoid colon common diverticula. No diverticulitis. Colon otherwise unremarkable. Small bowel is unremarkable. No mesenteric mass or inflammatory change.  Intervertebral cages are noted at L3-L4 and L4-L5. Degenerative changes noted no lost or ostial lesions. Right knee and aligned.  IMPRESSION: 1. Large right rectus abdominus hematoma with a hematocrit effect leading to a fluid fluid level. 2. No other acute abnormality.   Electronically Signed   By: Amie Portland M.D.   On: Oct 12, 2014 16:15   Dg Chest Port 1 View  10-12-2014   CLINICAL DATA:  Dyspnea  EXAM: PORTABLE CHEST - 1 VIEW  COMPARISON:  08/19/2014  FINDINGS: There is mild linear left base opacity which is new. This may represent developing infiltrate or atelectasis. The right lung is clear. Pulmonary vasculature is normal. There is no large effusion.  IMPRESSION: Increasing linear left base opacities consistent with infiltrate or atelectasis.   Electronically Signed   By: Ellery Plunk M.D.   On: 2014-10-12 22:32   Dg Abd 2 Views  10/12/14   CLINICAL DATA:  Abdominal pain and nausea for 1 day  EXAM: ABDOMEN - 2 VIEW  COMPARISON:  CT abdomen and pelvis May 15, 2012  FINDINGS: Supine and left lateral decubitus images obtained. There is fairly diffuse stool throughout colon. There is no bowel dilatation or air-fluid level suggesting  obstruction. No free air. Lung bases are clear. There is postoperative change in the lower lumbar spine as well as total hip prosthesis on the right. There are iliac artery stents bilaterally as well.  IMPRESSION: Overall bowel gas pattern unremarkable. No obstruction or free air. Fairly diffuse stool throughout colon.   Electronically Signed   By: Bretta Bang III M.D.   On: 10-12-2014 11:42     ASSESSMENT / PLAN:  PULMONARY A:  AECOPD >> improved. P:   Scheduled BD's  CARDIOVASCULAR Lt femoral introducer 5/05 >> A:  Hemorrhagic shock 2nd to abdominal wall hematoma. A fib >> in sinus. Acute on chronic diastolic CHF. Hx of HLD. P:  Continue IV fluids Pressors for goal MAP > 65 Hold coreg, cardizem, lipitor, lovaza  RENAL A:   AKI >> baseline creatinine 1.44 from 08/26/2014. P:   Monitor  urine outpt, electrolytes  GASTROINTESTINAL A:   Hx of GERD, Hiatal hernia. P:   NPO Protonix IV  HEMATOLOGIC A:   Acute blood loss anemia 2nd to abdominal wall hematoma. Marked leukocytosis. P:  F/u CBC, INR Transfuse for Hb < 7 or active bleeding  INFECTIOUS A: Marked leukocytosis >> ? If she has infection in abdominal wall hematoma. P:   Add vancomycin, zosyn 5/06 Check procalcitonin  Blood 5/06 >> Urine 5/06 >>  ENDOCRINE A:   Sick euthyroid syndrome. Relative adrenal insufficiency. Hx of gout. P:   Continue solu cortef Hold allopurinol  NEUROLOGIC A:   Acute metabolic encephalopathy. Hx of depression. P:   Monitor mental status Hold remeron, xanax  Updated pt's family at bedside.  Explained that blood pressure remains very low, and that her system might not be able to tolerate this much longer.  CC time 40 minutes.  Coralyn HellingVineet Karon Heckendorn, MD [facility] 09/21/2014, 9:07 AM Pager:  [phone] After 3pm call: [phone]

## 2014-09-14 NOTE — Progress Notes (Signed)
ANTIBIOTIC CONSULT NOTE - INITIAL  Pharmacy Consult for Vancomycin and Zosyn Indication: sepsis  Allergies  Allergen Reactions  . Ampicillin Other (See Comments)    Caused colitis  . Other Itching    Eye cream -doesn't know name  . Penicillins Other (See Comments)    Caused colitis  . Sulfa Antibiotics Diarrhea and Other (See Comments)    "muscles drew up"    Patient Measurements: Height: 5\' 3"  (160 cm) Weight: 193 lb 3.2 oz (87.635 kg) IBW/kg (Calculated) : 52.4  Vital Signs: Temp: 95.4 F (35.2 C) (05/06 0925) Temp Source: Rectal (05/06 0925) BP: 81/34 mmHg (05/06 0925) Pulse Rate: 64 (05/06 0925) Intake/Output from previous day: 05/05 0701 - 05/06 0700 In: 4927.6 [I.V.:2592.6; Blood:335; IV Piggyback:1500] Out: 20 [Urine:20] Intake/Output from this shift: Total I/O In: 346.4 [I.V.:316.4; Blood:30] Out: -   Labs:  Recent Labs  09/19/2014 0627  10/07/2014 1934 09/23/2014 0547 10/01/2014 0654  WBC  --   < > 15.6* 60.2* 62.3*  HGB  --   < > 12.3 7.5* 8.1*  PLT  --   < > 241 159 167  CREATININE 1.80*  --  2.24* 2.39*  --   < > = values in this interval not displayed. Estimated Creatinine Clearance: 18.1 mL/min (by C-G formula based on Cr of 2.39). No results for input(s): VANCOTROUGH, VANCOPEAK, VANCORANDOM, GENTTROUGH, GENTPEAK, GENTRANDOM, TOBRATROUGH, TOBRAPEAK, TOBRARND, AMIKACINPEAK, AMIKACINTROU, AMIKACIN in the last 72 hours.   Microbiology: Recent Results (from the past 720 hour(s))  MRSA PCR Screening     Status: None   Collection Time: 07-12-14  4:30 PM  Result Value Ref Range Status   MRSA by PCR NEGATIVE NEGATIVE Final    Comment:        The GeneXpert MRSA Assay (FDA approved for NASAL specimens only), is one component of a comprehensive MRSA colonization surveillance program. It is not intended to diagnose MRSA infection nor to guide or monitor treatment for MRSA infections.     Medical History: Past Medical History  Diagnosis Date  .  Atypical chest pain   . Palpitation   . SOB (shortness of breath)     Episodic  . Hypertensive urgency     Episodic  . HTN (hypertension)   . Asthma     Underlying  . Kidney disease     Chronic, stage 3, with creatinine clearance estimated to be about 40  . Anxiety     Chronic  . Constipation   . GERD (gastroesophageal reflux disease)   . Hiatal hernia     Hx of  . Hyperlipidemia   . Osteoarthritis   . CHF (congestive heart failure)   . PVD (peripheral vascular disease)   . AAA (abdominal aortic aneurysm)   . Syncope and collapse     Medications:  Prescriptions prior to admission  Medication Sig Dispense Refill Last Dose  . allopurinol (ZYLOPRIM) 300 MG tablet Take 300 mg by mouth daily.    09-26-14 at Unknown time  . azithromycin (ZITHROMAX) 250 MG tablet Take 250-500 mg by mouth daily. Take 2 tablets today, then 1 tablet daily for 4 days   09-26-14 at Unknown time  . carvedilol (COREG) 25 MG tablet Take 1 tablet (25 mg total) by mouth 2 (two) times daily with a meal.   Hold SBP < 105 60 tablet 3 09-26-14 at 0730  . cholecalciferol (VITAMIN D) 1000 UNITS tablet Take 2,000 Units by mouth daily.    09-26-14 at Unknown time  .  fluticasone (FLONASE) 50 MCG/ACT nasal spray Place 2 sprays into the nose at bedtime.   09/06/2014 at Unknown time  . furosemide (LASIX) 20 MG tablet Take 40 mg by mouth as needed.    Past Week at Unknown time  . lisinopril (PRINIVIL,ZESTRIL) 20 MG tablet Take 20 mg by mouth daily.   02-17-15 at Unknown time  . mirtazapine (REMERON) 30 MG tablet Take 30 mg by mouth at bedtime.   09/06/2014 at Unknown time  . pantoprazole (PROTONIX) 40 MG tablet Take 40 mg by mouth as needed.    Past Month at Unknown time  . predniSONE (DELTASONE) 20 MG tablet Take 40 mg by mouth daily with breakfast. For 5 days   02-17-15 at Unknown time  . Umeclidinium-Vilanterol 62.5-25 MCG/INH AEPB Inhale 1 puff into the lungs daily as needed.   09/06/2014 at Unknown time  . VENTOLIN  HFA 108 (90 BASE) MCG/ACT inhaler    09/06/2014 at Unknown time  . ALPRAZolam (XANAX) 0.25 MG tablet Take 0.25 mg by mouth at bedtime. For sleep   Taking  . azithromycin (ZITHROMAX) 250 MG tablet Take 250 mg by mouth daily. zpak     . Omega-3 Fatty Acids (FISH OIL) 1000 MG CAPS Take 1 capsule by mouth daily.   Taking  . oxyCODONE-acetaminophen (PERCOCET/ROXICET) 5-325 MG per tablet Take 1-2 tablets by mouth every 6 (six) hours as needed for moderate pain. (Patient not taking: Reported on 02-17-15) 50 tablet 0 Taking   Assessment: 81 yo female admitted 4/29 with AECOPD and CHF exacerbation. She developed A fib and started on xarelto. She developed abdominal pain and found to have Rt rectus abdominus hematoma with hemorrhagic shock - transferred to ICU 5/5. To begin Vancomycin and Zosyn Day #1 for sepsis (?abd wall hematoma infection). Noted allergy to PCN/ampicillin - "caused colitis"- MD ok with Zosyn. WBC up to 62.3. Hypothermic -afeb. LA 2.9.  Levaquin 4/29>5/4 (COPD exac) Vanc 5/6>> Zosyn 5/6>>  5/6 Bldx2>> 5/5 Urine>> MRSA PCR neg  Goal of Therapy:  Vancomycin trough level 15-20 mcg/ml  Plan:  Vancomycin 1500mg  IV now then 1gm IV q48h Zosyn 2.25gm IV q6h Will f/u renal function, micro data, pt's clinical condition Vanc levels prn  Christoper Fabianaron Sadler Teschner, PharmD, BCPS Clinical pharmacist, pager [phone] 09/23/2014,9:30 AM

## 2014-09-14 NOTE — Progress Notes (Signed)
eLink Physician-Brief Progress Note Patient Name: Amy Bolton DOB: 06/02/1927 MRN: [id number]   Date of Service  09/28/2014  HPI/Events of Note  Persistent shock, on norepi 50.  Minimal UOP Hgb lower but acceptable Abd is distended, ? A new intra-abd process  eICU Interventions  Will give another 1000cc IVF bolus and follow     Intervention Category Major Interventions: Shock - evaluation and management  Oracio Galen S. 10/04/2014, 1:48 AM

## 2014-09-14 NOTE — Progress Notes (Signed)
Patient Name: Amy Bolton Date of Encounter: 09/24/2014  Primary Cardiologist: Dr. Elease HashimotoNahser   Principal Problem:   COPD exacerbation Active Problems:   Diastolic dysfunction   Hyperlipidemia   CAD (coronary artery disease)   Obesity   PVD (peripheral vascular disease)   New onset atrial fibrillation   Paroxysmal atrial fibrillation   Low TSH level   Aortic stenosis   Chronic diastolic CHF (congestive heart failure)   Essential hypertension   Chronic renal failure, stage 3 (moderate)   Hiatal hernia   Acute on chronic diastolic congestive heart failure   Abdominal pain   Acute on chronic renal failure   Hypoxia    SUBJECTIVE  Still weak, denies any CP or SOB. Awake to voice. On [place]  CURRENT MEDS . antiseptic oral rinse  7 mL Mouth Rinse q12n4p  . chlorhexidine  15 mL Mouth Rinse BID  . hydrocortisone sod succinate (SOLU-CORTEF) inj  50 mg Intravenous Q6H  . levalbuterol  0.63 mg Nebulization Q6H WA  . naLOXone (NARCAN)  injection  0.4 mg Intravenous Once  . pantoprazole (PROTONIX) IV  40 mg Intravenous Q24H  . piperacillin-tazobactam (ZOSYN)  IV  2.25 g Intravenous 4 times per day  . sodium chloride  500 mL Intravenous Once  . vancomycin  1,500 mg Intravenous STAT  . [START ON 09/16/2014] vancomycin  1,000 mg Intravenous Q48H    OBJECTIVE  Filed Vitals:   May 03, 2015 1245 May 03, 2015 1250 May 03, 2015 1300 May 03, 2015 1305  BP: 86/23 86/23 98/57  98/57  Pulse: 65 66 63   Temp:  95.7 F (35.4 C)  95.7 F (35.4 C)  TempSrc:  Rectal  Rectal  Resp: 19 19 20    Height:      Weight:      SpO2:        Intake/Output Summary (Last 24 hours) at May 03, 2015 1308 Last data filed at May 03, 2015 1250  Gross per 24 hour  Intake 6800.4 ml  Output     20 ml  Net 6780.4 ml   Filed Weights   09/12/14 0400 09/19/2014 0516 May 03, 2015 0500  Weight: 178 lb 8 oz (80.967 kg) 178 lb 8 oz (80.967 kg) 193 lb 3.2 oz (87.635 kg)    PHYSICAL EXAM  General: Pleasant, NAD. Neuro: Alert and oriented X  3. Moves all extremities spontaneously. Psych: Normal affect. HEENT:  Normal  Neck: Supple without bruits or JVD. Lungs:  Resp regular and unlabored, anterior exam CTA. Heart: RRR no s3, s4, or murmurs. Abdomen: Soft, non-tender, non-distended, BS + x 4.  Extremities: No clubbing, cyanosis or edema. DP/PT/Radials 2+ and equal bilaterally.  Accessory Clinical Findings  CBC  Recent Labs  09/12/14 0225  May 03, 2015 0547 May 03, 2015 0654 May 03, 2015 1035  WBC 11.6*  < > 60.2* 62.3*  --   NEUTROABS 10.2*  --   --   --   --   HGB 15.2*  < > 7.5* 8.1* 8.0*  HCT 45.1  < > 22.0* 23.6* 23.7*  MCV 90.7  < > 91.3 91.5  --   PLT 264  < > 159 167  --   < > = values in this interval not displayed. Basic Metabolic Panel  Recent Labs  09/12/14 0225 09/26/2014 0627 09/23/2014 1934 May 03, 2015 0547  NA 138 137 135 137  K 4.0 4.2 4.4 4.1  CL 91* 92* 93* 108  CO2 33* 34* 32 20*  GLUCOSE 124* 178* 217* 169*  BUN 83* 83* 85* 82*  CREATININE 2.10* 1.80* 2.24* 2.39*  CALCIUM 8.4* 8.7* 7.3* 5.8*  MG 2.2  --   --   --   PHOS  --  5.1*  --   --    Liver Function Tests  Recent Labs  09/12/14 0225 09/24/2014 0627 09/27/2014 1934  AST 22  --  26  ALT 24  --  24  ALKPHOS 55  --  38  BILITOT 0.5  --  0.7  PROT 5.5*  --  3.8*  ALBUMIN 3.0* 3.0* 2.2*    Recent Labs  09/28/2014 1215  LIPASE 31   Cardiac Enzymes  Recent Labs  09/29/2014 1934  TROPONINI <0.03   Fasting Lipid Panel  Recent Labs  09/12/14 0225  CHOL 309*  HDL 42  LDLCALC 248*  TRIG 95  CHOLHDL 7.4    TELE Converted to NSR yesterday afternoon    ECG  No new EKG  Echocardiogram 05/16/2012  LV EF: 60% -  65%  ------------------------------------------------------------ Indications:   Aortic stenosis 424.1. Syncope 780.2.  ------------------------------------------------------------ History:  PMH:  Coronary artery disease. Risk factors: Hypertension. Obese.  Dyslipidemia.  ------------------------------------------------------------ Study Conclusions  - Left ventricle: The cavity size was normal. Wall thickness was normal. Systolic function was normal. The estimated ejection fraction was in the range of 60% to 65%. There was dynamic obstruction. Wall motion was normal; there were no regional wall motion abnormalities. Doppler parameters are consistent with abnormal left ventricular relaxation (grade 1 diastolic dysfunction). - Aortic valve: There was moderate stenosis. Mild regurgitation. Valve area: 0.84cm^2(VTI). Valve area: 0.8cm^2 (Vmax).    Radiology/Studies  Ct Abdomen Pelvis Wo Contrast  09/30/2014   CLINICAL DATA:  Right lower quadrant pain and nausea since last night.  EXAM: CT ABDOMEN AND PELVIS WITHOUT CONTRAST  TECHNIQUE: Multidetector CT imaging of the abdomen and pelvis was performed following the standard protocol without IV contrast.  COMPARISON:  05/15/2012  FINDINGS: There is a large right rectus abdominus hematoma measuring 13 cm in length by 6.5 cm x 13 cm transversely. A fluid fluid level lies within this. Hematoma displaces the posterior aspect of the right rectus abdominus muscle posteriorly where it indents the peritoneal cavity.  There is reticular opacity and mild peribronchial thickening in the lower lobes with additional patchy dependent opacity, left lower lobe greater than right lower lobe. This is likely atelectasis. No convincing pneumonia and no pulmonary edema. Small hiatal hernia.  Liver, spleen, pancreas and adrenal glands: Unremarkable. Gallbladder surgically absent. No bile duct dilation.  Mild bilateral renal cortical thinning. No renal masses. No hydronephrosis. Ureters normal course and in caliber. Bladder partly obscured from artifact from a right hip prosthesis but otherwise unremarkable.  Uterus surgically absent.  No pelvic masses.  Atherosclerotic calcifications noted throughout the  abdominal aorta extending into the iliac vessels. Small focal dilation of the infrarenal abdominal aorta with a maximum AP diameter of 2.2 cm.  No adenopathy.  No ascites.  Several left colon, mostly sigmoid colon common diverticula. No diverticulitis. Colon otherwise unremarkable. Small bowel is unremarkable. No mesenteric mass or inflammatory change.  Intervertebral cages are noted at L3-L4 and L4-L5. Degenerative changes noted no lost or ostial lesions. Right knee and aligned.  IMPRESSION: 1. Large right rectus abdominus hematoma with a hematocrit effect leading to a fluid fluid level. 2. No other acute abnormality.   Electronically Signed   By: Amie Portlandavid  Ormond M.D.   On: 09/11/2014 16:15   Dg Chest Port 1 View  10/03/2014   CLINICAL DATA:  Dyspnea  EXAM: PORTABLE CHEST -  1 VIEW  COMPARISON:  Sep 21, 2014  FINDINGS: There is mild linear left base opacity which is new. This may represent developing infiltrate or atelectasis. The right lung is clear. Pulmonary vasculature is normal. There is no large effusion.  IMPRESSION: Increasing linear left base opacities consistent with infiltrate or atelectasis.   Electronically Signed   By: Ellery Plunk M.D.   On: 10/08/2014 22:32   Dg Chest Port 1 View  Sep 21, 2014   CLINICAL DATA:  Productive cough. Shortness of breath. Wheezing. Symptoms started this morning.  EXAM: PORTABLE CHEST - 1 VIEW  COMPARISON:  03/15/2013  FINDINGS: Lower cervical plate and screw fixator. Atherosclerotic calcification of the aortic arch. Heart size within normal limits for projection. Postoperative findings from Mumford procedure on the right.  Borderline central airway thickening. Indistinct pulmonary vasculature is probably technique related. No overt edema.  IMPRESSION: 1. Airway thickening is present, suggesting bronchitis or reactive airways disease.   Electronically Signed   By: Gaylyn Rong M.D.   On: 09-21-14 13:04   Dg Abd 2 Views  09/24/2014   CLINICAL DATA:  Abdominal  pain and nausea for 1 day  EXAM: ABDOMEN - 2 VIEW  COMPARISON:  CT abdomen and pelvis May 15, 2012  FINDINGS: Supine and left lateral decubitus images obtained. There is fairly diffuse stool throughout colon. There is no bowel dilatation or air-fluid level suggesting obstruction. No free air. Lung bases are clear. There is postoperative change in the lower lumbar spine as well as total hip prosthesis on the right. There are iliac artery stents bilaterally as well.  IMPRESSION: Overall bowel gas pattern unremarkable. No obstruction or free air. Fairly diffuse stool throughout colon.   Electronically Signed   By: Bretta Bang III M.D.   On: 10/06/2014 11:42    ASSESSMENT AND PLAN  Patient admitted for HF and found to have new onset a-fib. Started on Xarelto. Rapid response called on 5/5 after syncope with severe hypotension. CT of abdomen confirms large R rectus abdominus hematoma with hematocrit effect. Pulmonary critical care consulted. Placed on pressor. Given PRBC and FFP. WBC jumped to 60 afterward.   1. Acute on chronic diastolic HF  - 2D ECHO completed 09/11/14 reveals LV EF 60-65%, moderate LVH, normal WM, mild AR  2. New onset a-fib - converted in the afternoon of 5/5  - CHADsVasc score is 6 (moderate - high risk of CVA), started on NOAC, however had spontaneous large R rectus abdominus hematoma with hematocrit effect.   - converted to NSR in the afternoon of 5/5, currently self rate controlled.   3. Hemorrhagic shock due to abdominal hematoma while on Xarelto  - continue to be hypotensive and hypothermic. On levophed and vasopresson, IVF. Off all BP med.   4. Severely elevated WBC  - WBC jumped to 60 after hemorrhage occurred. Pulmonary critical care on board  5. Aortic stenosis noted on echo  6. HTN 7. Hyperthyroidism   Signed, Azalee Course PA-C Pager: [phone]  I have seen and examined the patient along with Azalee Course PA-C.  I have reviewed the chart, notes and new data.   I agree with PA's note.  Seems to be showing some signs of improvement. BP a little higher and mentating well, but oligoanuric and suspect she will have ATN. Vulnerability to hypovolemia is likely related to aortic stenosis. No signs of hypervolemia yet. She is to receive large volume of blood products and need to monitor for overload.  Thurmon Fair, MD, [facility] (  [phone] 2014/10/11, 2:31 PM

## 2014-09-15 DIAGNOSIS — R578 Other shock: Secondary | ICD-10-CM | POA: Diagnosis not present

## 2014-09-15 DIAGNOSIS — R58 Hemorrhage, not elsewhere classified: Secondary | ICD-10-CM | POA: Diagnosis present

## 2014-09-15 DIAGNOSIS — S301XXD Contusion of abdominal wall, subsequent encounter: Secondary | ICD-10-CM

## 2014-09-15 DIAGNOSIS — S301XXA Contusion of abdominal wall, initial encounter: Secondary | ICD-10-CM | POA: Diagnosis present

## 2014-09-15 LAB — COMPREHENSIVE METABOLIC PANEL
ALT: 943 U/L — ABNORMAL HIGH (ref 14–54)
ANION GAP: 8 (ref 5–15)
AST: 968 U/L — ABNORMAL HIGH (ref 15–41)
Albumin: 1.9 g/dL — ABNORMAL LOW (ref 3.5–5.0)
Alkaline Phosphatase: 30 U/L — ABNORMAL LOW (ref 38–126)
BILIRUBIN TOTAL: 1.1 mg/dL (ref 0.3–1.2)
BUN: 96 mg/dL — AB (ref 6–20)
CO2: 22 mmol/L (ref 22–32)
Calcium: 6.1 mg/dL — CL (ref 8.9–10.3)
Chloride: 103 mmol/L (ref 101–111)
Creatinine, Ser: 2.79 mg/dL — ABNORMAL HIGH (ref 0.44–1.00)
GFR calc Af Amer: 17 mL/min — ABNORMAL LOW (ref >60)
GFR calc non Af Amer: 14 mL/min — ABNORMAL LOW (ref >60)
GLUCOSE: 173 mg/dL — AB (ref 70–99)
Potassium: 4.5 mmol/L (ref 3.5–5.1)
Sodium: 133 mmol/L — ABNORMAL LOW (ref 135–145)
Total Protein: 3.4 g/dL — ABNORMAL LOW (ref 6.5–8.1)

## 2014-09-15 LAB — URINE CULTURE
COLONY COUNT: NO GROWTH
COLONY COUNT: NO GROWTH
CULTURE: NO GROWTH
CULTURE: NO GROWTH
Special Requests: NORMAL

## 2014-09-15 LAB — CBC
HCT: 21 % — ABNORMAL LOW (ref 36.0–46.0)
Hemoglobin: 7.3 g/dL — ABNORMAL LOW (ref 12.0–15.0)
MCH: 29.9 pg (ref 26.0–34.0)
MCHC: 34.8 g/dL (ref 30.0–36.0)
MCV: 86.1 fL (ref 78.0–100.0)
Platelets: 75 10*3/uL — ABNORMAL LOW (ref 150–400)
RBC: 2.44 MIL/uL — ABNORMAL LOW (ref 3.87–5.11)
RDW: 15.7 % — ABNORMAL HIGH (ref 11.5–15.5)
WBC: 33.8 10*3/uL — ABNORMAL HIGH (ref 4.0–10.5)

## 2014-09-15 LAB — PREPARE FRESH FROZEN PLASMA
UNIT DIVISION: 0
Unit division: 0

## 2014-09-15 LAB — PROCALCITONIN: Procalcitonin: 1.82 ng/mL

## 2014-09-15 LAB — PROTIME-INR
INR: 1.96 — ABNORMAL HIGH (ref 0.00–1.49)
Prothrombin Time: 22.5 s — ABNORMAL HIGH (ref 11.6–15.2)

## 2014-09-15 LAB — PREPARE RBC (CROSSMATCH)

## 2014-09-15 MED ORDER — SODIUM CHLORIDE 0.9 % IV SOLN
Freq: Once | INTRAVENOUS | Status: AC
  Administered 2014-09-15: 10:00:00 via INTRAVENOUS

## 2014-09-15 MED ORDER — MORPHINE BOLUS VIA INFUSION
2.0000 mg | INTRAVENOUS | Status: DC | PRN
  Filled 2014-09-15: qty 2

## 2014-09-15 MED ORDER — SODIUM CHLORIDE 0.9 % IV SOLN
1.0000 mg/h | INTRAVENOUS | Status: DC
  Administered 2014-09-15: 1 mg/h via INTRAVENOUS
  Filled 2014-09-15: qty 10

## 2014-09-16 LAB — CALCIUM, IONIZED: Calcium, Ionized, Serum: 3.5 mg/dL — ABNORMAL LOW (ref 4.5–5.6)

## 2014-09-16 LAB — PARATHYROID HORMONE, INTACT (NO CA): PTH: 499 pg/mL — ABNORMAL HIGH (ref 15–65)

## 2014-09-17 LAB — TYPE AND SCREEN
ABO/RH(D): A POS
ANTIBODY SCREEN: NEGATIVE
UNIT DIVISION: 0
Unit division: 0
Unit division: 0
Unit division: 0
Unit division: 0
Unit division: 0

## 2014-09-21 LAB — CULTURE, BLOOD (ROUTINE X 2)
CULTURE: NO GROWTH
Culture: NO GROWTH

## 2014-10-10 NOTE — Progress Notes (Signed)
PULMONARY / CRITICAL CARE MEDICINE   Name: Amy Bolton MRN: [id number] DOB: 03/22/1928    ADMISSION DATE:  08/26/2014 CONSULTATION DATE:  09/21/2014  REFERRING MD :  Darnelle CatalanAMA  CHIEF COMPLAINT:  hypotension  INITIAL PRESENTATION:  81 yo female admitted 4/29 with AECOPD and CHF exacerbation.  She developed A fib and started on xarelto.  She developed abdominal pain and found to have Rt rectus abdominus hematoma with hemorrhagic shock.  STUDIES:  Echo 5/03 >> moderate LVH, EF 60 to 65%, mild AR CT abd 5/5 > Large right rectus abdominus hematoma with a hematocrit effect leading to a fluid fluid level.  SIGNIFICANT EVENTS: 4/29 admitted 5/01 new onset AF 5/05 abd pain > rectus abdominus hematoma > decompensation > to ICU 5/07 transfuse 2 units PRBC; changed to DNR  SUBJECTIVE:  C/o abdominal pain.  VITAL SIGNS: Temp:  [95.2 F (35.1 C)-98 F (36.7 C)] 97.4 F (36.3 C) (05/07 0801) Pulse Rate:  [62-89] 75 (05/07 0700) Resp:  [10-34] 24 (05/07 0700) BP: (65-162)/(13-109) 92/60 mmHg (05/07 0700) SpO2:  [88 %-100 %] 96 % (05/07 0738) Arterial Line BP: (59-139)/(28-53) 95/36 mmHg (05/07 0700) Weight:  [206 lb 9.1 oz (93.7 kg)] 206 lb 9.1 oz (93.7 kg) (05/07 0345) INTAKE / OUTPUT:  Intake/Output Summary (Last 24 hours) at 12-03-14 0854 Last data filed at 12-03-14 0600  Gross per 24 hour  Intake 5010.4 ml  Output    430 ml  Net 4580.4 ml    PHYSICAL EXAMINATION: General: pale Neuro: somnolent, follows commands, able to state her location and name HEENT: pale sclerea Cardiovascular: regular, 2/6 SM Lungs: no wheeze Abdomen: tender on light palpation in RUQ and RLQ Musculoskeletal: no edema Skin:  cool  LABS:  CBC  Recent Labs Lab 10/09/2014 0547 10/08/2014 0654 10/04/2014 1035 09/26/2014 1710 12-03-14 0300  WBC 60.2* 62.3*  --   --  33.8*  HGB 7.5* 8.1* 8.0* 9.0* 7.3*  HCT 22.0* 23.6* 23.7* 25.8* 21.0*  PLT 159 167  --   --  75*   Coag's  Recent Labs Lab  10/04/2014 1934 10/03/2014 1035 12-03-14 0300  APTT 28 37  --   INR 1.48  --  1.96*   BMET  Recent Labs Lab 10/08/2014 1934 09/28/2014 0547 12-03-14 0300  NA 135 137 133*  K 4.4 4.1 4.5  CL 93* 108 103  CO2 32 20* 22  BUN 85* 82* 96*  CREATININE 2.24* 2.39* 2.79*  GLUCOSE 217* 169* 173*   Electrolytes  Recent Labs Lab 09/09/14 0423  09/12/14 0225 10/01/2014 0627 09/20/2014 1934 09/26/2014 0547 12-03-14 0300  CALCIUM 8.7*  < > 8.4* 8.7* 7.3* 5.8* 6.1*  MG 2.3  --  2.2  --   --   --   --   PHOS  --   --   --  5.1*  --   --   --   < > = values in this interval not displayed. Sepsis Markers  Recent Labs Lab 10/04/2014 1933 09/28/2014 1035 10/06/2014 1055 09/20/2014 1800 12-03-14 0300  LATICACIDVEN 2.9*  --  4.3* 3.0*  --   PROCALCITON  --  1.26  --   --  1.82   ABG  Recent Labs Lab 09/21/2014 2207 09/25/2014 2325  PHART 7.282* 7.341*  PCO2ART 59.2* 47.7*  PO2ART 61.0* 89.0   Liver Enzymes  Recent Labs Lab 09/12/14 0225 10/07/2014 0627 09/25/2014 1934 12-03-14 0300  AST 22  --  26 968*  ALT 24  --  24  943*  ALKPHOS 55  --  38 30*  BILITOT 0.5  --  0.7 1.1  ALBUMIN 3.0* 3.0* 2.2* 1.9*   Cardiac Enzymes  Recent Labs Lab 09/29/2014 1934  TROPONINI <0.03   Glucose  Recent Labs Lab 09/10/2014 1829  GLUCAP 223*    Imaging No results found.   ASSESSMENT / PLAN:  PULMONARY A:  AECOPD >> improved. P:   Scheduled BD's  CARDIOVASCULAR Lt femoral introducer 5/05 >> A:  Hemorrhagic shock 2nd to abdominal wall hematoma. A fib >> in sinus. Acute on chronic diastolic CHF. Hx of HLD. P:  Continue IV fluids Pressors for goal MAP > 65 Hold coreg, cardizem, lipitor, lovaza  RENAL A:   AKI >> baseline creatinine 1.44 from 09/03/2014. P:   Monitor urine outpt, electrolytes  GASTROINTESTINAL A:   Hx of GERD, Hiatal hernia. P:   NPO Protonix IV  HEMATOLOGIC A:   Acute blood loss anemia 2nd to abdominal wall hematoma. Marked leukocytosis. P:  F/u CBC,  INR Transfuse for Hb < 7 or active bleeding  INFECTIOUS A: Marked leukocytosis >> ? If she has infection in abdominal wall hematoma. P:   Add vancomycin, zosyn 5/06 Check procalcitonin  Blood 5/06 >> Urine 5/06 >>  ENDOCRINE A:   Sick euthyroid syndrome. Relative adrenal insufficiency. Hx of gout. P:   Continue solu cortef Hold allopurinol  NEUROLOGIC A:   Acute metabolic encephalopathy. Hx of depression. P:   Monitor mental status PRN morphine Hold remeron, xanax  Goals of care: Family in agreement that they want to continue medical care to see if she can get better.  They do not want any "tubes" (ie intubation), and would not want cardiac resuscitation in the event of cardiac arrest.  DNR order placed.  CC time 40 minutes.  Coralyn HellingVineet Richele Strand, MD [facility] 10/07/2014, 8:54 AM Pager:  [phone] After 3pm call: [phone]

## 2014-10-10 NOTE — Progress Notes (Signed)
Progress Note   Amy Bolton DOB: 11/18/1927 DOA: 08/20/2014 PCP: Gwen PoundsUSSO,[name], MD   Brief Narrative:   Amy Bolton is an 81 y.o. female with a PMH of COPD, mild CAD, PAD, hypertension and stage III-4 chronic kidney disease who was admitted 09/06/2014 with dyspnea and wheeze after failing outpatient treatment with a Z-Pak and a prednisone taper. On initial evaluation, the patient was found to be hypoxic requiring supplemental oxygen.  The patient was treated for COPD exacerbation, and subsequently found to have atrial fibrillation.  She was placed on Xarelto in anticipation of proceeding with DCCV.  On 09/20/2014, the patient developed severe RLQ abdominal pain and was found to have a large abdominus rectus hematoma.  Xarelto was discontinued and she was moved to the ICU with PCCM consultation due to the development of hemorrhagic shock.  She was placed on pressor support and given multiple units of PRBCs.  On 09/09/2014, the patient requested comfort care only.  Her care was reassumed by myself and pressors were weaned off.    Assessment/Plan:   Principal Problem: Abdominal rectus hematoma complicated by hemorrhagic shock / abdominal pain - Patient reported new onset of severe abdominal pain 09/12/14. - 2 views of the abdomen obtained 10/08/2014: No free air or obstruction. Stool noted throughout colon. - CT abdomen pelvis showed large abdominal rectus hematoma. - On 09/25/2014 at approximately 7 PM, the patient developed hypotension refractory to IV fluids and was transferred to the ICU. - Care assumed by PCCM 09/25/2014-09/09/2014 while on pressors. She has also been given multiple units of blood. - On 09/24/2014, the patient and her family opted for comfort care. All nonessential medications discontinued. - Pressors now been weaned and the patient will be made comfortable with likely in hospital death within 24 hours. - Start morphine drip for comfort.  Active problems: Acute bronchospastic COPD  exacerbation - Oxygen for comfort. Respiratory status stable.  Acute renal failure - Creatinine steadily rising over the course of her admission. - KVO IV fluids, no further lab checks.  Paroxysmal atrial fibrillation - Newly appreciated on monitor and EKG since admission - no known prior history.  - Rate had been controlled on Coreg and Cardizem. - Transitioned to Xarelto per cardiology recommendations.  - TEE with cardioversion planned 09/29/2014, but was canceled due to her development of hemorrhagic shock as above.  - TSH low but free T4 only very mildly elevated.  Suppressed TSH - possible hyperthyroidism - Free T4 only slightly elevated at 1.14.  - Avoid amiodarone.  Chronic diastolic congestive heart failure - Baseline wgt appears to be ~82kg and pt remains at 80.96 - Net negative 5.2L since admit, balanced over the past 24 hours. - Continue to hold diuretic for now with climbing creatinine and follow trend.    Obesity - Body mass index is 31.63 kg/(m^2). - Comfort feeds.  Hypertension - All antihypertensives Bolton with the development of hemorrhagic shock.  Acute renal failure/Chronic kidney disease stage III - Patient's worsening renal failure is reflective of ongoing shock.  GERD/hiatal hernia - Managed with Protonix.  Hyperlipidemia - Managed with Lipitor.  Aortic stenosis - Moderate via echo May 2013 - followed by Dr. Elease HashimotoNahser.  Known abdominal aortic aneurysm / peripheral vascular disease - Stable at present.  DVT Prophylaxis - Off all blood thinners secondary to hemorrhagic shock.  Code Status: Full. Family Communication: 3 children as well as extended family updated at the bedside. Disposition Plan: Likely to experience an in  hospital death.   IV Access:    Left femoral CVC triple lumen placed 10/06/2014   Procedures and diagnostic studies:   Ct Abdomen Pelvis Wo Contrast  09/22/2014   CLINICAL DATA:  Right lower quadrant pain and nausea since last  night.  EXAM: CT ABDOMEN AND PELVIS WITHOUT CONTRAST  TECHNIQUE: Multidetector CT imaging of the abdomen and pelvis was performed following the standard protocol without IV contrast.  COMPARISON:  05/15/2012  FINDINGS: There is a large right rectus abdominus hematoma measuring 13 cm in length by 6.5 cm x 13 cm transversely. A fluid fluid level lies within this. Hematoma displaces the posterior aspect of the right rectus abdominus muscle posteriorly where it indents the peritoneal cavity.  There is reticular opacity and mild peribronchial thickening in the lower lobes with additional patchy dependent opacity, left lower lobe greater than right lower lobe. This is likely atelectasis. No convincing pneumonia and no pulmonary edema. Small hiatal hernia.  Liver, spleen, pancreas and adrenal glands: Unremarkable. Gallbladder surgically absent. No bile duct dilation.  Mild bilateral renal cortical thinning. No renal masses. No hydronephrosis. Ureters normal course and in caliber. Bladder partly obscured from artifact from a right hip prosthesis but otherwise unremarkable.  Uterus surgically absent.  No pelvic masses.  Atherosclerotic calcifications noted throughout the abdominal aorta extending into the iliac vessels. Small focal dilation of the infrarenal abdominal aorta with a maximum AP diameter of 2.2 cm.  No adenopathy.  No ascites.  Several left colon, mostly sigmoid colon common diverticula. No diverticulitis. Colon otherwise unremarkable. Small bowel is unremarkable. No mesenteric mass or inflammatory change.  Intervertebral cages are noted at L3-L4 and L4-L5. Degenerative changes noted no lost or ostial lesions. Right knee and aligned.  IMPRESSION: 1. Large right rectus abdominus hematoma with a hematocrit effect leading to a fluid fluid level. 2. No other acute abnormality.   Electronically Signed   By: Amie Portland M.D.   On: 10/07/2014 16:15   Dg Chest Port 1 View  10/06/2014   CLINICAL DATA:  Dyspnea  EXAM:  PORTABLE CHEST - 1 VIEW  COMPARISON:  Sep 21, 2014  FINDINGS: There is mild linear left base opacity which is new. This may represent developing infiltrate or atelectasis. The right lung is clear. Pulmonary vasculature is normal. There is no large effusion.  IMPRESSION: Increasing linear left base opacities consistent with infiltrate or atelectasis.   Electronically Signed   By: Ellery Plunk M.D.   On: 09/30/2014 22:32   Dg Chest Port 1 View  09/21/14   CLINICAL DATA:  Productive cough. Shortness of breath. Wheezing. Symptoms started this morning.  EXAM: PORTABLE CHEST - 1 VIEW  COMPARISON:  03/15/2013  FINDINGS: Lower cervical plate and screw fixator. Atherosclerotic calcification of the aortic arch. Heart size within normal limits for projection. Postoperative findings from Mumford procedure on the right.  Borderline central airway thickening. Indistinct pulmonary vasculature is probably technique related. No overt edema.  IMPRESSION: 1. Airway thickening is present, suggesting bronchitis or reactive airways disease.   Electronically Signed   By: Gaylyn Rong M.D.   On: 09/21/14 13:04   Dg Abd 2 Views  09/11/2014   CLINICAL DATA:  Abdominal pain and nausea for 1 day  EXAM: ABDOMEN - 2 VIEW  COMPARISON:  CT abdomen and pelvis May 15, 2012  FINDINGS: Supine and left lateral decubitus images obtained. There is fairly diffuse stool throughout colon. There is no bowel dilatation or air-fluid level suggesting obstruction. No free air. Lung bases are  clear. There is postoperative change in the lower lumbar spine as well as total hip prosthesis on the right. There are iliac artery stents bilaterally as well.  IMPRESSION: Overall bowel gas pattern unremarkable. No obstruction or free air. Fairly diffuse stool throughout colon.   Electronically Signed   By: Bretta BangWilliam  Woodruff III M.D.   On: 09/21/2014 11:42     Medical Consultants:    Dr. Dietrich PatesPaula Ross, Cardiology  Dr. Cyril Mourningakesh Alva, PCCM  Dr.  Viann FishSpencer Tilley, Cardiology  Anti-Infectives:    Doxycycline 4/29  Levaquin 09/09/14---> 09/12/14  Vancomycin 09/10/2014---> 09/21/2014  Zosyn 10/06/2014---> 09/27/2014    Subjective:   Amy Bolton is moaning out and reporting severe right lower quadrant abdominal pain and tenderness. She is somewhat lethargic after not sleeping well through the night. She has had a poor appetite.  Objective:    Filed Vitals:   10/04/2014 1315 10/08/2014 1330 09/30/2014 1345 10/02/2014 1400  BP:  95/37  102/19  Pulse: 70 70 70 72  Temp:      TempSrc:      Resp: 27 24 30 22   Height:      Weight:      SpO2: 96% 95% 97% 95%    Intake/Output Summary (Last 24 hours) at 09/28/2014 1440 Last data filed at 09/21/2014 1400  Gross per 24 hour  Intake 4174.41 ml  Output    380 ml  Net 3794.41 ml    Exam: Gen:  Weak, in moderate distress from abdominal pain Cardiovascular:  HSIR Respiratory:  Lungs diminished  Gastrointestinal:  Abdomen distended, extremely tender RLQ with guarding Extremities:  Trace edema   Data Reviewed:    Labs: Basic Metabolic Panel:  Recent Labs Lab 09/09/14 0423  09/12/14 0225 10/04/2014 0627 10/08/2014 1934 11/12/14 0547 10/07/2014 0300  NA 136  < > 138 137 135 137 133*  K 4.8  < > 4.0 4.2 4.4 4.1 4.5  CL 99*  < > 91* 92* 93* 108 103  CO2 25  < > 33* 34* 32 20* 22  GLUCOSE 121*  < > 124* 178* 217* 169* 173*  BUN 39*  < > 83* 83* 85* 82* 96*  CREATININE 1.40*  < > 2.10* 1.80* 2.24* 2.39* 2.79*  CALCIUM 8.7*  < > 8.4* 8.7* 7.3* 5.8* 6.1*  MG 2.3  --  2.2  --   --   --   --   PHOS  --   --   --  5.1*  --   --   --   < > = values in this interval not displayed. GFR Estimated Creatinine Clearance: 16 mL/min (by C-G formula based on Cr of 2.79). Liver Function Tests:  Recent Labs Lab 09/12/14 0225 10/02/2014 0627 09/17/2014 1934 09/12/2014 0300  AST 22  --  26 968*  ALT 24  --  24 943*  ALKPHOS 55  --  38 30*  BILITOT 0.5  --  0.7 1.1  PROT 5.5*  --  3.8* 3.4*  ALBUMIN 3.0* 3.0*  2.2* 1.9*   CBC:  Recent Labs Lab 09/12/14 0225 10/01/2014 1215  09/25/2014 1934 11/12/14 0547 11/12/14 0654 11/12/14 1035 11/12/14 1710 09/30/2014 0300  WBC 11.6* 14.2*  --  15.6* 60.2* 62.3*  --   --  33.8*  NEUTROABS 10.2*  --   --   --   --   --   --   --   --   HGB 15.2* 14.6  < > 12.3 7.5* 8.1* 8.0*  9.0* 7.3*  HCT 45.1 43.0  < > 37.2 22.0* 23.6* 23.7* 25.8* 21.0*  MCV 90.7 90.1  --  90.3 91.3 91.5  --   --  86.1  PLT 264 306  --  241 159 167  --   --  75*  < > = values in this interval not displayed. Cardiac Enzymes:  Recent Labs Lab 09-Oct-2014 1934  TROPONINI <0.03   Lipid Profile: No results for input(s): CHOL, HDL, LDLCALC, TRIG, CHOLHDL, LDLDIRECT in the last 72 hours. Sepsis Labs:  Recent Labs Lab 10-09-2014 1630 09-Oct-2014 1933 2014-10-09 1934 10/03/2014 0547 09/22/2014 0654 10/03/2014 1035 09/10/2014 1055 09/20/2014 1800 10/05/2014 0300  PROCALCITON  --   --   --   --   --  1.26  --   --  1.82  WBC  --   --  15.6* 60.2* 62.3*  --   --   --  33.8*  LATICACIDVEN 3.8* 2.9*  --   --   --   --  4.3* 3.0*  --    Microbiology Recent Results (from the past 240 hour(s))  MRSA PCR Screening     Status: None   Collection Time: 08/20/2014  4:30 PM  Result Value Ref Range Status   MRSA by PCR NEGATIVE NEGATIVE Final    Comment:        The GeneXpert MRSA Assay (FDA approved for NASAL specimens only), is one component of a comprehensive MRSA colonization surveillance program. It is not intended to diagnose MRSA infection nor to guide or monitor treatment for MRSA infections.   Culture, Urine     Status: None   Collection Time: 09-Oct-2014  6:30 PM  Result Value Ref Range Status   Specimen Description URINE, CATHETERIZED  Final   Special Requests Normal  Final   Colony Count NO GROWTH Performed at [facility]   Final   Culture NO GROWTH Performed at [facility]   Final   Report Status 09/29/2014 FINAL  Final  Urine culture     Status: None   Collection  Time: 09/20/2014  1:12 PM  Result Value Ref Range Status   Specimen Description URINE, RANDOM  Final   Special Requests NONE  Final   Colony Count NO GROWTH Performed at [facility]   Final   Culture NO GROWTH Performed at [facility]   Final   Report Status 09/27/2014 FINAL  Final     Medications:   . antiseptic oral rinse  7 mL Mouth Rinse q12n4p  . chlorhexidine  15 mL Mouth Rinse BID  . levalbuterol  0.63 mg Nebulization Q6H WA   Continuous Infusions: . morphine      Time spent: 35 minutes with > 50% of time discussing current diagnostic test results, clinical impression and plan of care with the patient and multiple family members.    LOS: 8 days   RAMA,CHRISTINA  [facility] Pager [phone]. If unable to reach me by pager, please call my cell phone at [phone].  *Please refer to [email or web address], password [email or web address] to get updated schedule on who will round on this patient, as hospitalists switch teams weekly. If 7PM-7AM, please contact night-coverage at [email or web address], password [email or web address] for any overnight needs.  09/30/2014, 2:40 PM

## 2014-10-10 NOTE — Progress Notes (Signed)
eLink Physician-Brief Progress Note Patient Name: Amy Bolton DOB: 06/23/1927 MRN: [id number]   Date of Service  09/26/2014  HPI/Events of Note  Patient with hypocalcemia in the setting of CKD- 3.  eICU Interventions  Plan: Check ionized calcium and intact PTH     Intervention Category Intermediate Interventions: Electrolyte abnormality - evaluation and management  DETERDING,ELIZABETH 09/24/2014, 5:24 AM

## 2014-10-10 NOTE — Progress Notes (Signed)
Subjective:  Somewhat sedated at the present time, complains of some back pain.  Transfused yesterday and has hemorrhagic shock.  Currently receiving additional blood.  Requiring pressure support.  Objective:  Vital Signs in the last 24 hours: BP 92/60 mmHg  Pulse 69  Temp(Src) 97.1 F (36.2 C) (Oral)  Resp 20  Ht 5\' 3"  (1.6 m)  Wt 93.7 kg (206 lb 9.1 oz)  BMI 36.60 kg/m2  SpO2 96%  Physical Exam: Elderly somewhat sedated white female currently receiving blood no respiratory distress Lungs:  Clear Cardiac:  Regular rhythm, normal S1 and S2, no S3, 2/6 systolic murmur of aortic stenosis Abdomen:  Soft, nontender, no masses Extremities:  No edema present, extremities no deformity  Intake/Output from previous day: 05/06 0701 - 05/07 0700 In: 5168.6 [I.V.:3638.6; Blood:720; IV Piggyback:700] Out: 430 [Urine:430]  Weight Filed Weights   Oct 27, 2014 0516 10/06/2014 0500 09/17/2014 0345  Weight: 80.967 kg (178 lb 8 oz) 87.635 kg (193 lb 3.2 oz) 93.7 kg (206 lb 9.1 oz)    Lab Results: Basic Metabolic Panel:  Recent Labs  16/02/9604/17/2016 0547 09/14/2014 0300  NA 137 133*  K 4.1 4.5  CL 108 103  CO2 20* 22  GLUCOSE 169* 173*  BUN 82* 96*  CREATININE 2.39* 2.79*   CBC:  Recent Labs  09/14/2014 0654  09/14/14 1710 10/02/2014 0300  WBC 62.3*  --   --  33.8*  HGB 8.1*  < > 9.0* 7.3*  HCT 23.6*  < > 25.8* 21.0*  MCV 91.5  --   --  86.1  PLT 167  --   --  75*  < > = values in this interval not displayed.   Cardiac Panel (last 3 results)  Recent Labs  Oct 27, 2014 1934  TROPONINI <0.03    Telemetry: Maintaining sinus rhythm  Assessment/Plan:  1.  Acute on chronic diastolic congestive heart failure-ejection fraction is normal 2.  Acute on chronic renal failure-worse today 3.  Aortic stenosis 4.  History of atrial fibrillation currently in sinus rhythm 4.  Hemorrhagic shock due to abdominal hematoma on XARELTO.  She is continues with pressors and off blood pressure medicines.   Receiving transfusions the present time.  Recommendations:  Severely anemic and receiving additional blood today.  No evidence of volume overload.  Remains critically ill.     Darden PalmerW. Spencer Diquan Kassis, Jr.  MD [facility] Cardiology  09/23/2014, 11:29 AM

## 2014-10-10 NOTE — Progress Notes (Signed)
Patient asystole at 1633, no heart or lung sounds auscultated, pupils fixed and dilated. Family at the bedside, Dr. Darnelle Catalanama notified per family request.  Corliss SkainsJuan Devoiry Corriher RN

## 2014-10-10 NOTE — Discharge Summary (Signed)
Death Summary      Amy Sinclairnnie J Fix ZOX:[id number] DOB: 01/01/1928 DOA: 08/21/2014  PCP: Gwen PoundsUSSO,[name], MD  Admit date: 08/12/2014 Date of Death: 09/28/2014   Final Diagnoses:   Principal Problem:    Hemorrhagic shock Active Problems:    Diastolic dysfunction    Hyperlipidemia    CAD (coronary artery disease)    Obesity    PVD (peripheral vascular disease)    COPD exacerbation    New onset atrial fibrillation    Paroxysmal atrial fibrillation    Low TSH level    Aortic stenosis    Chronic diastolic CHF (congestive heart failure)    Essential hypertension    Chronic renal failure, stage 3 (moderate)    Hiatal hernia    Acute on chronic diastolic congestive heart failure    Abdominal pain    Acute on chronic renal failure    Hypoxia    Hematoma of abdominal wall    Bleeding    Leukocytosis    Possible septic shock   History of Present Illness:   Amy Bolton is an 81 y.o. female with a PMH of COPD, mild CAD, PAD, hypertension and stage III-4 chronic kidney disease who was admitted 08/17/2014 with dyspnea and wheeze after failing outpatient treatment with a Z-Pak and a prednisone taper. On initial evaluation, the patient was found to be hypoxic requiring supplemental oxygen.   Hospital Course:   The patient was treated for COPD exacerbation, and subsequently found to have atrial fibrillation. She was placed on Xarelto in anticipation of proceeding with DCCV. On 09/28/2014, the patient developed severe RLQ abdominal pain and was found to have a large abdominus rectus hematoma. Xarelto was discontinued and she was moved to the ICU with PCCM consultation due to the development of hemorrhagic shock. She was placed on pressor support and given multiple units of PRBCs. Despite aggressive care, the patient's condition continued to deteriorate, with leukocytosis worrisome for possible septic shock.  She was placed on broad spectrum antibiotics and stress dose  steroids.  On 09/22/2014, the patient requested comfort care only. Her family was present during this discussion.  Her care was reassumed by myself and pressors were weaned off. She was placed on a morphine drip for pain control.    Time of death: 4:33 pm  Signed:  RAMA,CHRISTINA  [facility] 09/11/2014, 4:56 PM

## 2014-10-10 NOTE — Progress Notes (Signed)
Chaplain responded to call for pt death. Pt pastor has been presnt all day. Family appreciative of care received from hospital, pt passing peacefully, and chaplain support. Chaplain offered to keep pt family in her prayers this week; gesture appreciated. Page chaplain as needed.    09/27/2014 1600  Clinical Encounter Type  Visited With Family  Visit Type Death;Spiritual support  Referral From Nurse  Consult/Referral To Faith community  Spiritual Encounters  Spiritual Needs Prayer;Grief support  Stress Factors  Family Stress Factors Loss  Charmian MuffStamey, Brayln Duque F, Chaplain 09/13/2014 4:54 PM

## 2014-10-10 NOTE — Progress Notes (Signed)
CRITICAL VALUE ALERT  Critical value received:  Ca 6.1  Date of notification:  09/16/2014  Time of notification:  05:50  Critical value read back:Yes.    Nurse who received alert:  Lasandra BeechAyana Smith, RN  MD notified (1st page):  Deterding, E.  Time of first page:  06:20  MD notified (2nd page):  Time of second page:  Responding MD:  Deterding, E.  Time MD responded:  06:24

## 2014-10-10 NOTE — Progress Notes (Signed)
Per Dr. Darnelle Catalanama from [facility] comfort orders were placed and to wean pressors off. Morphine drip started. Family at the bedside.  Corliss SkainsJuan Marili Vader RN

## 2014-10-10 NOTE — Progress Notes (Signed)
Wasted 225 of morphine, 2nd RN verified Mort Sawyerseresa Crite RN.  Corliss SkainsJuan Gwendolyne Welford RN

## 2014-10-10 DEATH — deceased
